# Patient Record
Sex: Female | Born: 1970 | Race: Black or African American | Hispanic: No | Smoking: Never smoker
Health system: Southern US, Community
[De-identification: ages and names within clinical notes are randomized; demographics above are authoritative.]

## PROBLEM LIST (undated history)

## (undated) DIAGNOSIS — N946 Dysmenorrhea, unspecified: Secondary | ICD-10-CM

## (undated) DIAGNOSIS — D259 Leiomyoma of uterus, unspecified: Secondary | ICD-10-CM

## (undated) DIAGNOSIS — M549 Dorsalgia, unspecified: Secondary | ICD-10-CM

## (undated) DIAGNOSIS — R7611 Nonspecific reaction to tuberculin skin test without active tuberculosis: Secondary | ICD-10-CM

## (undated) DIAGNOSIS — Z1231 Encounter for screening mammogram for malignant neoplasm of breast: Secondary | ICD-10-CM

## (undated) DIAGNOSIS — Z0000 Encounter for general adult medical examination without abnormal findings: Secondary | ICD-10-CM

## (undated) DIAGNOSIS — M797 Fibromyalgia: Secondary | ICD-10-CM

## (undated) DIAGNOSIS — G35 Multiple sclerosis: Secondary | ICD-10-CM

## (undated) DIAGNOSIS — F419 Anxiety disorder, unspecified: Secondary | ICD-10-CM

## (undated) DIAGNOSIS — J449 Chronic obstructive pulmonary disease, unspecified: Secondary | ICD-10-CM

## (undated) DIAGNOSIS — G43909 Migraine, unspecified, not intractable, without status migrainosus: Secondary | ICD-10-CM

## (undated) DIAGNOSIS — M5430 Sciatica, unspecified side: Secondary | ICD-10-CM

## (undated) DIAGNOSIS — E663 Overweight: Secondary | ICD-10-CM

## (undated) DIAGNOSIS — F988 Other specified behavioral and emotional disorders with onset usually occurring in childhood and adolescence: Secondary | ICD-10-CM

## (undated) DIAGNOSIS — M1990 Unspecified osteoarthritis, unspecified site: Secondary | ICD-10-CM

## (undated) DIAGNOSIS — I10 Essential (primary) hypertension: Secondary | ICD-10-CM

## (undated) DIAGNOSIS — F4310 Post-traumatic stress disorder, unspecified: Secondary | ICD-10-CM

## (undated) DIAGNOSIS — F319 Bipolar disorder, unspecified: Secondary | ICD-10-CM

## (undated) HISTORY — PX: TUBAL LIGATION: SHX77

## (undated) MED ORDER — TRIMETHOPRIM-SULFAMETHOXAZOLE 160 MG-800 MG TAB
160-800 mg | ORAL_TABLET | Freq: Two times a day (BID) | ORAL | Status: AC
Start: ? — End: 2012-11-16

## (undated) MED ORDER — OXYCODONE-ACETAMINOPHEN 5 MG-325 MG TAB
5-325 mg | ORAL_TABLET | ORAL | Status: DC | PRN
Start: ? — End: 2016-03-13

## (undated) MED ORDER — OXYCODONE-ACETAMINOPHEN 5 MG-325 MG TAB
5-325 mg | ORAL_TABLET | Freq: Four times a day (QID) | ORAL | Status: DC | PRN
Start: ? — End: 2016-03-13

## (undated) MED ORDER — CEPHALEXIN 500 MG CAP
500 mg | ORAL_CAPSULE | Freq: Four times a day (QID) | ORAL | Status: AC
Start: ? — End: 2012-11-13

---

## 2008-05-05 LAB — METABOLIC PANEL, COMPREHENSIVE
A-G Ratio: 1 — ABNORMAL LOW (ref 1.1–2.2)
ALT (SGPT): 40 U/L (ref 30–65)
AST (SGOT): 16 U/L (ref 15–37)
Albumin: 3.8 g/dL (ref 3.5–5.0)
Alk. phosphatase: 84 U/L (ref 50–136)
Anion gap: 10 mmol/L (ref 5–15)
BUN/Creatinine ratio: 12 (ref 12–20)
BUN: 13 MG/DL (ref 6–20)
Bilirubin, total: 0.3 MG/DL (ref <1.0)
CO2: 29 MMOL/L (ref 21–32)
Calcium: 9.3 MG/DL (ref 8.5–10.1)
Chloride: 106 MMOL/L (ref 97–108)
Creatinine: 1.1 MG/DL (ref 0.6–1.3)
GFR est AA: >60 mL/min/{1.73_m2} (ref >60)
GFR est non-AA: >60 mL/min/{1.73_m2} (ref >60)
Globulin: 3.8 g/dL (ref 2.0–4.0)
Glucose: 104 MG/DL — ABNORMAL HIGH (ref 50–100)
Potassium: 4.2 MMOL/L (ref 3.5–5.1)
Protein, total: 7.6 g/dL (ref 6.4–8.2)
Sodium: 145 MMOL/L (ref 136–145)

## 2008-05-05 LAB — CBC WITH AUTOMATED DIFF
ABS. BASOPHILS: 0 10*3/uL (ref 0.0–0.1)
ABS. EOSINOPHILS: 0.1 10*3/uL (ref 0.0–0.4)
ABS. LYMPHOCYTES: 3.3 10*3/uL (ref 0.8–3.5)
ABS. MONOCYTES: 0.6 10*3/uL (ref 0–1.0)
ABS. NEUTROPHILS: 4.1 10*3/uL (ref 1.8–8.0)
BASOPHILS: 0 % (ref 0–1)
EOSINOPHILS: 1 % (ref 0–7)
HCT: 35 % (ref 35.0–47.0)
HGB: 11.3 g/dL — ABNORMAL LOW (ref 11.5–16.0)
LYMPHOCYTES: 41 % (ref 12–49)
MCH: 28.6 PG (ref 26.0–34.0)
MCHC: 32.3 g/dL (ref 30.0–35.0)
MCV: 88.6 FL (ref 80.0–99.0)
MONOCYTES: 7 % (ref 5–13)
NEUTROPHILS: 51 % (ref 32–75)
PLATELET: 316 10*3/uL (ref 150–400)
RBC: 3.95 M/uL (ref 3.80–5.20)
RDW: 15.8 % — ABNORMAL HIGH (ref 11.5–14.5)
WBC: 8.1 10*3/uL (ref 3.6–11.0)

## 2008-05-05 LAB — TSH 3RD GENERATION: TSH: 1.41 u[IU]/mL (ref 0.35–5.5)

## 2008-07-26 LAB — URINALYSIS W/ REFLEX CULTURE
Bilirubin: NEGATIVE
Blood: NEGATIVE
Glucose: NEGATIVE MG/DL
Leukocyte Esterase: NEGATIVE
Nitrites: NEGATIVE
Protein: 100 MG/DL — AB
Specific gravity: 1.03 (ref 1.003–1.030)
Urobilinogen: 0.2 EU/DL (ref 0.2–1.0)
pH (UA): 6 (ref 5.0–8.0)

## 2008-07-26 LAB — CBC WITH AUTOMATED DIFF
ABS. BASOPHILS: 0 10*3/uL (ref 0.0–0.1)
ABS. EOSINOPHILS: 0.4 10*3/uL (ref 0.0–0.4)
ABS. LYMPHOCYTES: 3.3 10*3/uL (ref 0.8–3.5)
ABS. MONOCYTES: 0.5 10*3/uL (ref 0–1.0)
ABS. NEUTROPHILS: 3.8 10*3/uL (ref 1.8–8.0)
BASOPHILS: 0 % (ref 0–1)
EOSINOPHILS: 4 % (ref 0–7)
HCT: 38.6 % (ref 35.0–47.0)
HGB: 13 g/dL (ref 11.5–16.0)
LYMPHOCYTES: 42 % (ref 12–49)
MCH: 28.6 PG (ref 26.0–34.0)
MCHC: 33.7 g/dL (ref 30.0–35.0)
MCV: 85 FL (ref 80.0–99.0)
MONOCYTES: 6 % (ref 5–13)
NEUTROPHILS: 48 % (ref 32–75)
PLATELET: 312 10*3/uL (ref 150–400)
RBC: 4.54 M/uL (ref 3.80–5.20)
RDW: 14.6 % — ABNORMAL HIGH (ref 11.5–14.5)
WBC: 8 10*3/uL (ref 3.6–11.0)

## 2008-07-26 LAB — DRUG SCREEN UR - NO CONFIRM
AMPHETAMINES: NEGATIVE
BARBITURATES: NEGATIVE
BENZODIAZEPINES: NEGATIVE
COCAINE: POSITIVE — AB
OPIATES: POSITIVE — AB
PCP(PHENCYCLIDINE): NEGATIVE
THC (TH-CANNABINOL): POSITIVE — AB
TRICYCLICS: POSITIVE — AB

## 2008-07-26 LAB — METABOLIC PANEL, COMPREHENSIVE
A-G Ratio: 0.9 — ABNORMAL LOW (ref 1.1–2.2)
ALT (SGPT): 31 U/L (ref 30–65)
AST (SGOT): 15 U/L (ref 15–37)
Albumin: 4 g/dL (ref 3.5–5.0)
Alk. phosphatase: 71 U/L (ref 50–136)
Anion gap: 6 mmol/L (ref 5–15)
BUN/Creatinine ratio: 7 — ABNORMAL LOW (ref 12–20)
BUN: 8 MG/DL (ref 6–20)
Bilirubin, total: 0.6 MG/DL (ref <1.0)
CO2: 29 MMOL/L (ref 21–32)
Calcium: 9.4 MG/DL (ref 8.5–10.1)
Chloride: 103 MMOL/L (ref 97–108)
Creatinine: 1.1 MG/DL (ref 0.6–1.3)
GFR est AA: >60 mL/min/{1.73_m2} (ref >60)
GFR est non-AA: >60 mL/min/{1.73_m2} (ref >60)
Globulin: 4.3 g/dL — ABNORMAL HIGH (ref 2.0–4.0)
Glucose: 105 MG/DL — ABNORMAL HIGH (ref 50–100)
Potassium: 3.3 MMOL/L — ABNORMAL LOW (ref 3.5–5.1)
Protein, total: 8.3 g/dL — ABNORMAL HIGH (ref 6.4–8.2)
Sodium: 138 MMOL/L (ref 136–145)

## 2008-07-26 LAB — ETHYL ALCOHOL: ALCOHOL(ETHYL),SERUM: 4 MG/DL — AB

## 2008-07-27 NOTE — Consults (Signed)
Name: Anita Bell, Anita Bell Admitted: 07/26/2008  MR #: [id number] DOB: [birth year]  Account #: [id number] Age: 37  Consultant: Sol Blazing, MD Location: [phone]     CONSULTATION REPORT    DATE OF CONSULTATION: 07/27/2008  REFERRING PHYSICIAN:      REFERRING PHYSICIAN: Dr. Cathleen Corti.    PRIMARY CARE PHYSICIAN: Lillia Corporal, MD, also Charlies Constable, MD, and Dr.  Sallyanne Kuster (it is her pain management specialist).    REASON FOR CONSULTATION: Asthma.    HISTORY OF PRESENT ILLNESS: This is a 37 year old black female who  apparently became depressed and suicidal, came to [facility], was  evaluated, and admitted to the Psychiatric Unit.    ALLERGIES: ASPIRIN.    HOME MEDICATIONS:  1. Cymbalta 60 daily.  2. Lyrica 300 b.i.d.  3. Effexor XR 75 b.i.d.  4. Hydrocodone 7.5/500 two tablets b.i.d. p.r.n.  5. Wellbutrin 75 daily.  6. Flexeril 10 mg b.i.d.  7. Prilosec 20 mg daily.  8. Seroquel 200 at bedtime.  9. Flonase 2 sprays each nostril daily.  10. Singulair 10 mg daily.  11. Symbicort 2 puffs b.i.d.  12. Ventolin inhaler 2 puffs q.4h. p.r.n.  13. Lidoderm patch, 3 patches daily. They should be on for 12 and off for  12.    PAST MEDICAL HISTORY: Migraines. The last one was a month ago. Attention  deficit disorder. Schizoaffective disorder. Depression. Asthma. Spinal  degenerative disk disease. Fibromyalgia syndrome.    PAST SURGICAL HISTORY: Bilateral tubal ligation.    SOCIAL HISTORY: Does not smoke or use cigarettes. She uses cocaine (she  snorts it) and marijuana. She denies any use of IV drugs. She is  separated with 4 children, and she lives with her 2 sons.    FAMILY HISTORY: Mom has depression. Also, there is some diabetes on her  mother's side of the family. Dad has she quotes "just body aches."    REVIEW OF SYSTEMS: Other than some shortness of breath due to her asthma,  complete review of systems done and negative.    ANCILLARY DATA: CMP showed potassium 3.3. CBC without significant   findings. Urine toxicology positive for cocaine, marijuana, opiates, and  tricyclics. UA negative for infection.    PHYSICAL EXAMINATION:  VITAL SIGNS: 97.2, heart rate 80, respirations 16, BP 107/69.  GENERAL APPEARANCE: The patient is alert and oriented x3, in no acute  distress.  HEENT: Normocephalic, atraumatic. Pupils are equal and reactive to light.  Oropharynx is pink and moist.  NECK: No lymphadenopathy or thyromegaly.  HEART: Regular rate and rhythm. No murmurs, rubs, or gallops.  LUNGS: Clear to auscultation anteriorly and posteriorly. I hear no  wheezes or crackles.  ABDOMEN: Some tenderness in the left lower quadrant. No obvious masses.  Bowel sounds are present.  EXTREMITIES: Radial pulses 2+. No edema. Warm and dry.  NEUROLOGIC STATUS: No focal neurological deficits.    ASSESSMENT:  1. Hypokalemia. Replace.  2. Asthma. Continue home medications.  3. Constipation. Will put her on a bowel regimen.    DICTATED BY: Luanna Cole, NP      E-Signed By  Sol Blazing, MD 08/05/2008 13:23    Sol Blazing, MD    cc: Sol Blazing, MD        AB/WMX; D: 07/27/2008 1:47 P; T: 07/27/2008 3:37 P; DOC# [id number]; Job#  [id number]

## 2008-07-28 LAB — CULTURE, URINE
Colonies Counted: >100000
Colony Count: >100000

## 2008-07-28 LAB — CULTURE, MRSA
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
## 2008-11-29 ENCOUNTER — Encounter

## 2008-12-27 MED ORDER — DICLOFENAC 75 MG TAB, DELAYED RELEASE
75 mg | ORAL_TABLET | Freq: Two times a day (BID) | ORAL | Status: DC
Start: 2008-12-27 — End: 2009-07-10

## 2008-12-27 MED ORDER — POLYETHYLENE GLYCOL 3350 100 % ORAL POWDER
17 gram/dose | Freq: Every day | ORAL | Status: AC
Start: 2008-12-27 — End: 2008-12-30

## 2008-12-27 MED ORDER — CYCLOBENZAPRINE 10 MG TAB
10 mg | ORAL_TABLET | Freq: Three times a day (TID) | ORAL | Status: DC | PRN
Start: 2008-12-27 — End: 2009-02-18

## 2008-12-27 MED ORDER — ALBUTEROL SULFATE 0.083 % (0.83 MG/ML) SOLN FOR INHALATION
2.5 mg /3 mL (0.083 %) | RESPIRATORY_TRACT | Status: AC | PRN
Start: 2008-12-27 — End: 2009-01-26

## 2008-12-27 NOTE — Progress Notes (Signed)
HISTORY OF PRESENT ILLNESS  Anita Bell is a 38 y.o. female.  HPIcomes for follow up. Needs refills on medications.     Review of Systems   Constitutional: Negative for fever and chills.   Respiratory: Positive for shortness of breath. Negative for cough and sputum production.    Cardiovascular: Negative for chest pain.   Gastrointestinal: Negative.    Genitourinary: Negative.    Musculoskeletal: Positive for back pain.   Skin: Negative for rash and itching.   Neurological: Positive for weakness and headaches. Negative for dizziness, tingling, tremors and seizures.   Psychiatric/Behavioral: Negative for depression and suicidal ideas.   All other systems reviewed and are negative.        Physical Exam   Nursing note and vitals reviewed.  Constitutional: She is oriented. No distress.   HENT:   Head: Normocephalic and atraumatic.   Right Ear: External ear normal.   Left Ear: External ear normal.   Eyes: Conjunctivae and extraocular motions are normal. Right eye exhibits no discharge. Left eye exhibits no discharge.   Neck: Neck supple. No thyromegaly present.   Cardiovascular: Normal rate, regular rhythm and normal heart sounds.    Pulmonary/Chest: Effort normal and breath sounds normal. No respiratory distress. She has no wheezes. She has no rales. She exhibits no tenderness.   Abdominal: Soft. She exhibits no distension. No tenderness.   Musculoskeletal: She exhibits no edema and no tenderness.        Back R.O.M moderately limited in all planes   Lymphadenopathy:     She has no cervical adenopathy.   Neurological: She is alert and oriented. No cranial nerve deficit. Coordination normal.   Skin: No rash noted. She is not diaphoretic. No erythema.   Psychiatric: She has a normal mood and affect. Her behavior is normal. Judgment and thought content normal.       ASSESSMENT and PLAN  [name] was seen today for follow-up, asthma, constipation and medication refill.    Diagnoses and associated orders for this visit:     Asthma  - albuterol (PROVENTIL, VENTOLIN) nebulizer solution 0.083%; 3 mL by Nebulization route every four (4) hours as needed for Wheezing for 30 days.    Fibromyalgia  - diclofenac EC (VOLTAREN) tablet; Take 1 Tab by mouth two (2) times a day for 360 days.  - CYCLOBENZAPRINE 10 MG TAB; Take 1 Tab by mouth three (3) times daily as needed.    Chronic back pain  - diclofenac EC (VOLTAREN) tablet; Take 1 Tab by mouth two (2) times a day for 360 days.  - CYCLOBENZAPRINE 10 MG TAB; Take 1 Tab by mouth three (3) times daily as needed.    Constipation, chronic  - polyethylene glycol (MIRALAX) powder 255 g; Take 17 g by mouth daily for 3 days.    Other Orders  - DOCUSATE SODIUM 50 MG CAP; Take 50 mg by mouth two (2) times a day.

## 2008-12-27 NOTE — Progress Notes (Signed)
The patient presents with follow up with asthma flareups, achy body,constipation,  Medication refills. Pt need a pain mgt doctor.  Anita Bell  12/27/08

## 2009-02-18 MED ORDER — BUDESONIDE-FORMOTEROL HFA 160 MCG-4.5 MCG/ACTUATION AEROSOL INHALER
Freq: Two times a day (BID) | RESPIRATORY_TRACT | Status: DC
Start: 2009-02-18 — End: 2009-07-10

## 2009-02-18 MED ORDER — TRAMADOL 50 MG TAB
50 mg | ORAL_TABLET | Freq: Four times a day (QID) | ORAL | Status: AC | PRN
Start: 2009-02-18 — End: 2009-02-28

## 2009-02-18 MED ORDER — AMOXICILLIN-POT CLAVULANATE 400 MG-57 MG/5 ML ORAL SUSP
400-57 mg/5 mL | Freq: Two times a day (BID) | ORAL | Status: AC
Start: 2009-02-18 — End: 2009-02-28

## 2009-02-18 MED ORDER — ALBUTEROL SULFATE HFA 90 MCG/ACTUATION AEROSOL INHALER
90 mcg/actuation | RESPIRATORY_TRACT | Status: AC | PRN
Start: 2009-02-18 — End: 2010-02-13

## 2009-02-18 NOTE — Progress Notes (Signed)
HISTORY OF PRESENT ILLNESS  Anita Bell is a 38 y.o. female.  Dental Pain   The history is provided by the patient. This is a new problem. The current episode started yesterday. The problem occurs constantly. The problem has not changed since onset. The pain is located in the left lower mouth.The quality of the pain is aching.  The pain is at a severity of 10/10. Associated symptoms include headaches and gum redness.There was no vomiting, no nausea, no fever, no swelling, no chest pain, no shortness of breath and no drainage. Treatments tried: hydrocodone. The treatment provided mild relief. The patient has no cardiac history.      ROS    Physical Exam   Constitutional: She appears distressed.   HENT:   Head: Normocephalic and atraumatic.        Absent last moller.Gum swollen and tender   Eyes: Conjunctivae and extraocular motions are normal.   Neck: Neck supple.   Cardiovascular: Normal rate, regular rhythm and normal heart sounds.    Pulmonary/Chest: Effort normal and breath sounds normal. No respiratory distress. She has no wheezes. She has no rales.   Musculoskeletal: She exhibits no edema and no tenderness.   Lymphadenopathy:     She has no cervical adenopathy.   Skin: Skin is warm and dry. No rash noted. She is not diaphoretic. No erythema.   Psychiatric: She has a normal mood and affect. Her behavior is normal. Thought content normal.       ASSESSMENT and PLAN  Amada was seen today for follow up chronic condition and other.    Diagnoses and associated orders for this visit:  Saanya was seen today for follow up chronic condition and other.    Diagnoses and associated orders for this visit:    Dental abscess  - tramadol (ULTRAM)50 mg  tablet; Take 2 Tabs by mouth every six (6) hours as needed for Pain for 10 days.  Referred to [facility] dental clinic.  Extrinsic asthma, unspecified  - BUDESONIDE-FORMOTEROL HFA 160 MCG-4.5 MCG/ACTUATION AEROSOL INHALER; Take 2 Puffs by inhalation two (2) times a day.   - albuterol (PROVENTIL HFA,VENTOLIN HFA) 90 mcg/inhalation; Take 2 Puffs by inhalation every four (4) hours as needed for Wheezing for 360 days.    Unspecified myalgia and myositis  Stable on Present Regimen.Continue the same.  Adhd (attention deficit hyperactivity disorder)  Follows up with psychiatrist.      Dental abscess  - tramadol (ULTRAM)50 mg  tablet; Take 2 Tabs by mouth every six (6) hours as needed for Pain for 10 days.      Referred to [facility]  Dental clinic.

## 2009-02-18 NOTE — Progress Notes (Addendum)
Addended by: Ryun Velez on: 02/18/2009      Modules accepted: Orders

## 2009-02-18 NOTE — Progress Notes (Signed)
The patient presents with follow up, pt has tooth pain need antibiotic.  Anita Bell  02/18/09

## 2009-04-12 MED ORDER — SUMATRIPTAN 50 MG TAB
50 mg | ORAL_TABLET | Freq: Once | ORAL | Status: AC | PRN
Start: 2009-04-12 — End: 2009-05-12

## 2009-04-12 NOTE — Progress Notes (Signed)
Sees Dr. Elenora Fender.  Has had a H/A for a few days and now it is very painful.

## 2009-04-12 NOTE — Progress Notes (Signed)
HISTORY OF PRESENT ILLNESS  Anita Bell is a 38 y.o. female here with moderate migraine headache since this morning.She had similar headache yesterday but not that severe.Today headache 10 out of 10,associated with nausea and photophobia,no blurred vision,or focal weakness or neck tightness.tried Tylenol 650 mg did not touch her.asthma is under control now.  HPI    Review of Systems   Constitutional: Negative.  Negative for fever, chills and weight loss.   HENT: Negative for hearing loss, ear pain, congestion and tinnitus.    Eyes: Negative.    Respiratory: Positive for wheezing. Negative for sputum production and shortness of breath.    Cardiovascular: Negative.  Negative for chest pain, palpitations and orthopnea.   Gastrointestinal: Negative.    Genitourinary: Negative.    Musculoskeletal: Negative.    Skin: Negative.    Neurological: Positive for headaches. Negative for tingling, tremors, sensory change, speech change, focal weakness, seizures and loss of consciousness.   Endo/Heme/Allergies: Negative.    Psychiatric/Behavioral: Negative.        Physical Exam   Constitutional: She is oriented to person, place, and time. She appears well-developed and well-nourished. She appears distressed.   HENT:   Head: Normocephalic and atraumatic.   Right Ear: External ear normal.   Left Ear: External ear normal.   Nose: Nose normal.   Mouth/Throat: Oropharynx is clear and moist. No oropharyngeal exudate.   Eyes: Conjunctivae and extraocular motions are normal. Pupils are equal, round, and reactive to light. Left eye exhibits no discharge.   Neck: Normal range of motion. Neck supple. No JVD present. No thyromegaly present.   Cardiovascular: Normal rate, regular rhythm, normal heart sounds and intact distal pulses.    Pulmonary/Chest: Effort normal and breath sounds normal.   Lymphadenopathy:     She has no cervical adenopathy.    Neurological: She is alert and oriented to person, place, and time. She has normal reflexes. She displays normal reflexes. No cranial nerve deficit. She exhibits normal muscle tone. Coordination normal.   Skin: Skin is warm. No rash noted. No erythema. No pallor.   Psychiatric: She has a normal mood and affect. Her behavior is normal. Judgment and thought content normal.       ASSESSMENT and PLAN  Anita Bell was seen today for headache.    Diagnoses and associated orders for this visit:    Migraine nec/intractable  - sumatriptan (IMITREX) 50 mg  tablet; Take 1 Tab by mouth once as needed for Migraine for 30 days.  - KETOROLAC TROMETHAMINE INJ 60 mg IM given once.  - THER/PROPH/DIAG INJECTION, SUBCUT/IM  - PROMETHAZINE HCL INJECTION 25 mg IM once given.  - THER/PROPH/DIAG INJECTION, SUBCUT/IM  Adv to go to ER with worsen headache.    Extrinsic asthma, unspecified  Controlled with symbicort and singulair.

## 2009-04-12 NOTE — Patient Instructions (Signed)
English   Spanish  Migraine Headache: After Your Visit  Your Care Instructions  Migraines are painful, throbbing headaches that often start on one side of the head. They may cause nausea and vomiting and make you sensitive to light, sound, or smell.  Without treatment, migraines can last from 4 hours to a few days. Medicines can help prevent migraines or stop them once they have started. Your doctor can help you find which ones work best for you.  Follow-up care is a key part of your treatment and safety. Be sure to make and go to all appointments, and call your doctor if you are having problems. It???s also a good idea to know your test results and keep a list of the medicines you take.  How can you care for yourself at home?  ?? Do not drive if you have taken a prescription pain medicine.   ?? Rest in a quiet, dark room until your headache is gone. Close your eyes and try to relax or go to sleep. Do not watch TV or read.   ?? Put a cold, moist cloth or cold pack on the painful area for 10 to 20 minutes at a time. Put a thin cloth between the cold pack and your skin.   ?? Have someone gently massage your neck and shoulders.   ?? Take your medicines exactly as prescribed. Call your doctor if you think you are having a problem with your medicine. You will get more details on the specific medicines your doctor prescribes.  To prevent migraines   ?? Keep a headache diary so you can figure out what triggers your headaches. Avoiding triggers may help you prevent headaches. Record when each headache began, how long it lasted, and what the pain was like (throbbing, aching, stabbing, or dull). Write down any other symptoms you had with the headache, such as nausea, flashing lights or dark spots, or sensitivity to bright light or loud noise. Note if the headache occurred near your period. List anything that might have triggered the headache, such as certain foods (chocolate, cheese, wine) or odors, smoke, bright light, stress, or lack of sleep.   ?? If your doctor has prescribed medicine for your migraines, take it as directed. You may have medicine that you take only when you get a migraine and medicine that you take all the time to help prevent migraines.   ?? If your doctor has prescribed medicine for when you get a headache, take it at the first sign of a migraine, unless your doctor has given you other instructions.   ?? If your doctor has prescribed medicine to prevent migraines, take it exactly as prescribed. Call your doctor if you think you are having a problem with your medicine.  ?? Find healthy ways to deal with stress. Migraines are most common during or right after stressful times. Take time to relax before and after you do something that has caused a migraine in the past.   ?? Try to keep your muscles relaxed by keeping good posture. Check your jaw, face, neck, and shoulder muscles for tension, and try relaxing them. When sitting at a desk, change positions often, and stretch for 30 seconds each hour.   ?? Get regular sleep and exercise.   ?? Eat regular meals, and avoid foods and drinks that often trigger migraines. These include chocolate, alcohol (especially red wine and port), aspartame, monosodium glutamate (MSG), and some additives found in foods (such as hot dogs, bacon, cold   cuts, aged cheeses, and pickled foods).    ?? Limit caffeine by not drinking too much coffee, tea, or soda. But do not quit caffeine suddenly, because that can also give you migraines.   ?? Do not smoke or allow others to smoke around you. If you need help quitting, talk to your doctor about stop-smoking programs and medicines. These can increase your chances of quitting for good.   ?? If you are taking birth control pills or hormone therapy, talk to your doctor about whether they are triggering your migraines.  When should you call for help?  Call 911 anytime you think you may need emergency care. For example, call if:  ?? You have signs of a stroke. These may include:   ?? Sudden numbness, paralysis, or weakness in your face, arm, or leg, especially on only one side of your body.   ?? New problems with walking or balance.   ?? Sudden vision changes.   ?? Drooling or slurred speech.   ?? New problems speaking or understanding simple statements, or feeling confused.   ?? A sudden, severe headache that is different from past headaches.  Call your doctor now or seek immediate medical care if:  ?? You develop a fever and a stiff neck.   ?? You have new nausea and vomiting, or you cannot keep down food or liquids.  Watch closely for changes in your health, and be sure to contact your doctor if:  ?? Your headache has not gotten better within 1 or 2 days.   ?? Your headaches get worse or happen more often.   Where can you learn more?   Go to http://www.healthwise.net/[facility].  Enter U690 in the search box to learn more about "Migraine Headache: After Your Visit."    ?? 2006-2010 Healthwise, [employer]. Care instructions adapted under license by [employer] (which disclaims liability or warranty for this information). This care instruction is for use with your licensed healthcare professional. If you have questions about a medical condition or this instruction, always ask your healthcare professional. Healthwise disclaims any warranty or liability for your use of this information.

## 2009-04-30 MED ORDER — PREGABALIN 300 MG CAP
300 mg | ORAL_CAPSULE | ORAL | Status: DC
Start: 2009-04-30 — End: 2009-09-14

## 2009-05-06 MED ORDER — SUMATRIPTAN 6 MG/0.5 ML SUB-Q
6 mg/0.5 mL | SUBCUTANEOUS | Status: AC
Start: 2009-05-06 — End: 2009-05-06
  Administered 2009-05-06: 09:00:00 via SUBCUTANEOUS

## 2009-05-06 MED ORDER — HYDROXYZINE HCL 50 MG/ML IM SOLN
50 mg/mL | INTRAMUSCULAR | Status: AC
Start: 2009-05-06 — End: 2009-05-06
  Administered 2009-05-06: 10:00:00 via INTRAMUSCULAR

## 2009-05-06 MED FILL — HYDROXYZINE HCL 50 MG/ML IM SOLN: 50 mg/mL | INTRAMUSCULAR | Qty: 2

## 2009-05-06 MED FILL — IMITREX 6 MG/0.5 ML SUBCUTANEOUS SOLUTION: 6 mg/0.5 mL | SUBCUTANEOUS | Qty: 0.5

## 2009-05-06 NOTE — ED Provider Notes (Signed)
Patient is a 38 y.o. female presenting with headache. The history is provided by the patient. No language interpreter was used.   Headache   This is a recurrent problem. The current episode started 3 to 5 hours ago. The problem occurs constantly. The problem has not changed since onset. The headache is aggravated by bright light and photophobia. The pain is located in the left unilateral region. The quality of the pain is described as throbbing. The pain is at a severity of 10/10. Associated symptoms include nausea. Pertinent negatives include no anorexia, no fever, no malaise/fatigue, no chest pressure, no near-syncope, no orthopnea, no palpitations, no syncope, no shortness of breath, no weakness, no tingling, no dizziness, no visual change and no vomiting. She has tried nothing for the symptoms.        Past Medical History   Diagnosis Date   ??? ADHD    ??? Depression    ??? Asthma    ??? Osteoarthritis    ??? Fibromyalgia    ??? Gastroenteritis    ??? Hydradenitis      suppurativa          No past surgical history on file.      Family History   Problem Relation   ??? Alcohol abuse Neg Hx   ??? Arthritis-rheumatoid Neg Hx   ??? Asthma Neg Hx   ??? Bleeding Prob Neg Hx   ??? Cancer Neg Hx   ??? Diabetes Neg Hx   ??? Elevated Lipids Neg Hx   ??? Headache Neg Hx   ??? Heart Disease Neg Hx   ??? Hypertension Neg Hx   ??? Lung Disease Neg Hx   ??? Migraines Neg Hx   ??? Psychiatric Disorder Neg Hx   ??? Stroke Neg Hx   ??? Mental Retardation Neg Hx          History   Social History   ??? Marital Status: Unknown     Spouse Name: N/A     Number of Children: N/A   ??? Years of Education: N/A   Occupational History   ??? Not on file.   Social History Main Topics   ??? Tobacco Use: Never   ??? Alcohol Use: No   ??? Drug Use: Yes     Special: Prescription, Opiates   ??? Sexually Active: No   Other Topics Concern   ??? Not on file   Social History Narrative   ??? No narrative on file           ALLERGIES: Aspirin and Ibuprofen      Review of Systems    Constitutional: Negative for fever and malaise/fatigue.   Respiratory: Negative for shortness of breath.    Cardiovascular: Negative for palpitations, orthopnea and syncope.   Gastrointestinal: Positive for nausea. Negative for vomiting.   Neurological: Negative for dizziness, tingling and weakness.   All other systems reviewed and are negative.        Filed Vitals:    05/06/2009  4:01 AM   BP: 139/97   Pulse: 87   Temp: 98.2 ??F (36.8 ??C)   Resp: 16   Height: 5\' 5"  (1.651 m)   Weight: 170 lb (77.111 kg)   SpO2: 100%              Physical Exam   Nursing note and vitals reviewed.  Constitutional: She is oriented to person, place, and time. She appears well-developed and well-nourished. No distress.   HENT:   Head: Normocephalic and atraumatic.  Right Ear: External ear normal.   Left Ear: External ear normal.   Nose: Nose normal.   Mouth/Throat: Oropharynx is clear and moist. No oropharyngeal exudate.   Eyes: Extraocular motions are normal. Pupils are equal, round, and reactive to light. Right eye exhibits no discharge. Left eye exhibits no discharge. Right conjunctiva is injected. Left conjunctiva is injected. No scleral icterus.   Neck: Normal range of motion. Neck supple. No JVD present. No tracheal deviation present. No thyromegaly present.   Cardiovascular: Normal rate, regular rhythm and normal heart sounds.  Exam reveals no gallop and no friction rub.    No murmur heard.  Pulmonary/Chest: Effort normal and breath sounds normal. No stridor. No respiratory distress. She has no wheezes. She has no rales. She exhibits no tenderness.   Lymphadenopathy:     She has no cervical adenopathy.   Neurological: She is alert and oriented to person, place, and time. No cranial nerve deficit.   Skin: Skin is warm and dry. No rash noted. She is not diaphoretic. No erythema.   Psychiatric: She has a normal mood and affect. Her behavior is normal. Judgment and thought content normal.            Coding      Procedures

## 2009-05-06 NOTE — ED Notes (Signed)
[employer] called to arrange cab transportation  [phone] reference number

## 2009-05-06 NOTE — ED Notes (Signed)
Patient complaint of headache since 11pm last night. Patient was at [facility] but stated "we were tired of waiting".

## 2009-05-06 NOTE — Progress Notes (Signed)
5:17 AM No relief with Imitrex. Agrees to find a ride home so we can give her a shot of Vistaril on discharge.

## 2009-07-10 MED ORDER — METHYLPREDNISOLONE 4 MG TABS IN A DOSE PACK
4 mg | PACK | ORAL | Status: DC
Start: 2009-07-10 — End: 2009-09-14

## 2009-07-10 MED ORDER — CYCLOBENZAPRINE 10 MG TAB
10 mg | ORAL_TABLET | Freq: Three times a day (TID) | ORAL | Status: AC
Start: 2009-07-10 — End: 2009-07-20

## 2009-07-10 MED ORDER — HYDROCODONE-ACETAMINOPHEN 5 MG-500 MG TAB
5-500 mg | ORAL | Status: AC
Start: 2009-07-10 — End: 2009-07-10
  Administered 2009-07-10: 19:00:00 via ORAL

## 2009-07-10 MED ORDER — HYDROCODONE-ACETAMINOPHEN 7.5 MG-500 MG TAB
ORAL_TABLET | Freq: Four times a day (QID) | ORAL | Status: DC | PRN
Start: 2009-07-10 — End: 2009-09-14

## 2009-07-10 MED FILL — HYDROCODONE-ACETAMINOPHEN 5 MG-500 MG TAB: 5-500 mg | ORAL | Qty: 2

## 2009-07-10 NOTE — ED Notes (Signed)
Stable at discharge, verbalizes understanding of instructions, walked out with friends, 3 prescriptions given.

## 2009-07-10 NOTE — ED Notes (Signed)
Patient with multiple medical problems, states back pain worsening last night after falling off the bed, states she landed on her back.

## 2009-07-10 NOTE — ED Provider Notes (Signed)
Patient is a 38 y.o. female presenting with back pain. The history is provided by the patient Larey Seat out of bed last night landing on her back and is in severe pain.  Denies numbness, tingling, weakness, pain into legs.). No language interpreter was used.   Back Pain   This is a new problem. The current episode started 6 to 12 hours ago. The problem has not changed since onset. The problem occurs constantly. Patient reports no work related injury.The pain is associated with a fall. The pain is present in the thoracic spine and lumbar spine. The quality of the pain is described as stabbing and aching. The pain does not radiate. The pain is at a severity of 9/10. The pain is severe. The symptoms are aggravated by bending, twisting and certain positions. The pain is the same all the time. Pertinent negatives include no chest pain, no fever, no numbness, no weight loss, no headaches, no abdominal pain, no abdominal swelling, no bowel incontinence, no perianal numbness, no bladder incontinence, no dysuria, no pelvic pain, no leg pain, no paresthesias, no paresis, no tingling and no weakness. She has tried nothing for the symptoms. Risk factors: none.        Past Medical History   Diagnosis Date   ??? ADHD    ??? Depression    ??? Asthma    ??? Osteoarthritis    ??? Fibromyalgia    ??? Gastroenteritis    ??? Hydradenitis      suppurativa          No past surgical history on file.      Family History   Problem Relation Age of Onset   ??? Alcohol abuse Neg Hx    ??? Arthritis-rheumatoid Neg Hx    ??? Asthma Neg Hx    ??? Bleeding Prob Neg Hx    ??? Cancer Neg Hx    ??? Diabetes Neg Hx    ??? Elevated Lipids Neg Hx    ??? Headache Neg Hx    ??? Heart Disease Neg Hx    ??? Hypertension Neg Hx    ??? Lung Disease Neg Hx    ??? Migraines Neg Hx    ??? Psychiatric Disorder Neg Hx    ??? Stroke Neg Hx    ??? Mental Retardation Neg Hx           History   Social History   ??? Marital Status: Unknown     Spouse Name: N/A     Number of Children: N/A   ??? Years of Education: N/A    Occupational History   ??? Not on file.   Social History Main Topics   ??? Tobacco Use: Never   ??? Alcohol Use: No   ??? Drug Use: Yes     Special: Prescription, Opiates   ??? Sexually Active: No   Other Topics Concern   ??? Not on file   Social History Narrative   ??? No narrative on file           ALLERGIES: Aspirin and Ibuprofen      Review of Systems   Constitutional: Negative for fever and weight loss.   Cardiovascular: Negative for chest pain.   Gastrointestinal: Negative for abdominal pain.   Genitourinary: Negative for bladder incontinence, dysuria and pelvic pain.   Musculoskeletal: Positive for back pain.   Neurological: Negative for tingling, weakness, numbness and headaches.   All other systems reviewed and are negative.        Filed  Vitals:    07/10/2009  1:01 PM 07/10/2009  1:03 PM   BP:  106/75   Pulse: 78    Temp: 98.4 ??F (36.9 ??C)    Resp: 18    Height: 5\' 5"  (1.651 m)    Weight: 176 lb (79.833 kg)    SpO2: 100%               Physical Exam   Nursing note and vitals reviewed.  Constitutional: She is oriented to person, place, and time. She appears well-developed and well-nourished. No distress.   HENT:   Head: Normocephalic and atraumatic.   Right Ear: External ear normal.   Left Ear: External ear normal.   Mouth/Throat: Oropharynx is clear and moist. No oropharyngeal exudate.   Eyes: Conjunctivae and extraocular motions are normal. Pupils are equal, round, and reactive to light. Right eye exhibits no discharge. Left eye exhibits no discharge. No scleral icterus.   Neck: Normal range of motion. Neck supple. No JVD present. No tracheal deviation present. No thyromegaly present.   Cardiovascular: Normal rate, regular rhythm and normal heart sounds.    No murmur heard.  Pulmonary/Chest: Effort normal and breath sounds normal. No respiratory distress. She has no wheezes. She has no rales. She exhibits no tenderness.    Abdominal: Soft. Bowel sounds are normal. She exhibits no distension and no mass. No tenderness. She has no rebound and no guarding.   Musculoskeletal: Normal range of motion. She exhibits tenderness. She exhibits no edema.        Mid back;  Tenderness to palpation with spasm present left lower para spinalis muscles.  Low back;  Tenderness to palpation with spasm present bilateral para spinalis muscles L>R.  Pos. L/S joint tenderness   Pos. Bilateral S-I joint tenderness   Neg. Sciatic notch tenderness.   Lymphadenopathy:     She has no cervical adenopathy.   Neurological: She is alert and oriented to person, place, and time. She has normal reflexes. She displays normal reflexes. No cranial nerve deficit. She exhibits normal muscle tone. Coordination normal.   Skin: Skin is warm. No rash noted. She is not diaphoretic. No erythema. No pallor.   Psychiatric: She has a normal mood and affect. Her behavior is normal. Judgment and thought content normal.        Coding    Procedures

## 2009-09-14 NOTE — ED Provider Notes (Signed)
HPI Comments: Pt no longer has a family doctor, but still has insurance. The doctor refused her from his practice. Patient has run out of all of her medications especially her pain medications.    Patient is a 38 y.o. female presenting with abdominal pain and back pain. The history is provided by the patient.   Abdominal Pain   This is a new problem. The current episode started more than 2 days ago. The problem occurs constantly. The problem has been gradually worsening. The pain is located in the suprapubic region. The quality of the pain is aching. The pain is at a severity of 5/10. The pain is mild. Associated symptoms include back pain. Pertinent negatives include no diarrhea, no nausea, no dysuria and no chest pain. Nothing worsens the pain. The pain is relieved by nothing. Past workup includes no CT scan and no surgery. Her past medical history does not include PUD.   Back Pain   This is a chronic problem. The current episode started more than 1 week ago. The problem has not changed since onset. The problem occurs constantly. The pain is associated with no known injury. The pain is present in the lumbar spine. The quality of the pain is described as stabbing. The pain does not radiate. The pain is at a severity of 5/10. The pain is mild. The symptoms are aggravated by bending. The pain is the same all the time. Associated symptoms include abdominal pain. Pertinent negatives include no chest pain, no bowel incontinence, no perianal numbness, no bladder incontinence and no dysuria.        Past Medical History   Diagnosis Date   ??? ADHD    ??? Depression    ??? Asthma    ??? Osteoarthritis    ??? Fibromyalgia    ??? Gastroenteritis    ??? Hydradenitis      suppurativa          No past surgical history on file.      Family History   Problem Relation Age of Onset   ??? Alcohol abuse Neg Hx    ??? Arthritis-rheumatoid Neg Hx    ??? Asthma Neg Hx    ??? Bleeding Prob Neg Hx    ??? Cancer Neg Hx    ??? Diabetes Neg Hx     ??? Elevated Lipids Neg Hx    ??? Headache Neg Hx    ??? Heart Disease Neg Hx    ??? Hypertension Neg Hx    ??? Lung Disease Neg Hx    ??? Migraines Neg Hx    ??? Psychiatric Disorder Neg Hx    ??? Stroke Neg Hx    ??? Mental Retardation Neg Hx           History   Social History   ??? Marital Status: Unknown     Spouse Name: N/A     Number of Children: N/A   ??? Years of Education: N/A   Occupational History   ??? Not on file.   Social History Main Topics   ??? Tobacco Use: Never   ??? Alcohol Use: No   ??? Drug Use: Yes     Special: Prescription, Opiates   ??? Sexually Active: No   Other Topics Concern   ??? Not on file   Social History Narrative   ??? No narrative on file           ALLERGIES: Aspirin and Ibuprofen      Review of Systems  Constitutional: Negative.    HENT: Negative.    Eyes: Negative.    Respiratory: Negative.    Cardiovascular: Negative for chest pain.   Gastrointestinal: Positive for abdominal pain. Negative for nausea and diarrhea.   Genitourinary: Negative for bladder incontinence and dysuria.   Musculoskeletal: Positive for back pain.   All other systems reviewed and are negative.        Filed Vitals:    09/14/2009  8:27 PM   BP: 119/74   Pulse: 87   Temp: 98.1 ??F (36.7 ??C)   Resp: 16   Height: 5\' 5"  (1.651 m)   Weight: 182 lb (82.555 kg)   SpO2: 100%              Physical Exam   Nursing note and vitals reviewed.  Constitutional: She is oriented to person, place, and time. She appears well-developed and well-nourished.   HENT:   Head: Normocephalic and atraumatic.   Right Ear: External ear normal.   Left Ear: External ear normal.   Mouth/Throat: Oropharynx is clear and moist. No oropharyngeal exudate.   Eyes: Conjunctivae and extraocular motions are normal. Pupils are equal, round, and reactive to light. Right eye exhibits no discharge. Left eye exhibits no discharge. No scleral icterus.   Neck: Normal range of motion. No tracheal deviation present. No thyromegaly present.    Cardiovascular: Normal rate, regular rhythm and normal heart sounds.    No murmur heard.  Pulmonary/Chest: Effort normal and breath sounds normal. No respiratory distress. She has no wheezes. She has no rales. She exhibits no tenderness.   Abdominal: Soft. She exhibits no distension. No tenderness. She has no rebound and no guarding.   Musculoskeletal: Normal range of motion. She exhibits no edema and no tenderness.   Lymphadenopathy:     She has no cervical adenopathy.   Neurological: She is alert and oriented to person, place, and time. No cranial nerve deficit. Coordination normal.   Skin: Skin is warm. No erythema.   Psychiatric: She has a normal mood and affect. Her behavior is normal. Judgment and thought content normal.        Coding    Procedures

## 2009-09-14 NOTE — ED Notes (Signed)
Patient complains of suprapubic pain and lower back pain x 3 days. Patient denies any vaginal discharge or urinary symptoms. Patient complains of some nausea but no vomiting.

## 2009-09-15 LAB — URINALYSIS W/ REFLEX CULTURE
Bilirubin: NEGATIVE
Glucose: NEGATIVE MG/DL
Ketone: NEGATIVE MG/DL
Leukocyte Esterase: NEGATIVE
Nitrites: NEGATIVE
Specific gravity: 1.029 (ref 1.003–1.030)
Urobilinogen: 0.2 EU/DL (ref 0.2–1.0)
pH (UA): 6 (ref 5.0–8.0)

## 2009-09-15 LAB — HCG URINE, QL. - POC: Pregnancy test,urine (POC): NEGATIVE

## 2009-09-15 MED ORDER — PROMETHAZINE 25 MG TAB
25 mg | ORAL_TABLET | Freq: Four times a day (QID) | ORAL | Status: AC | PRN
Start: 2009-09-15 — End: 2009-09-21

## 2009-09-15 MED ORDER — PREGABALIN 50 MG CAP
50 mg | ORAL_CAPSULE | Freq: Three times a day (TID) | ORAL | Status: AC
Start: 2009-09-15 — End: 2009-10-14

## 2009-09-15 MED ORDER — HYDROCODONE-ACETAMINOPHEN 7.5 MG-500 MG TAB
ORAL_TABLET | Freq: Four times a day (QID) | ORAL | Status: DC | PRN
Start: 2009-09-15 — End: 2012-11-05

## 2009-09-15 MED ADMIN — ondansetron (ZOFRAN ODT) tablet 4 mg: ORAL | @ 02:00:00 | NDC 62756024064

## 2009-09-15 MED ADMIN — acetaminophen (TYLENOL) tablet 1,000 mg: ORAL | @ 02:00:00 | NDC 51645070610

## 2009-09-15 MED FILL — ONDANSETRON 4 MG TAB, RAPID DISSOLVE: 4 mg | ORAL | Qty: 1

## 2009-09-15 MED FILL — ACETAMINOPHEN 500 MG TAB: 500 mg | ORAL | Qty: 2

## 2009-09-16 LAB — CULTURE, URINE
Colonies Counted: 11000
Colony Count: 11000

## 2009-10-18 MED ORDER — CYCLOBENZAPRINE 10 MG TAB
10 mg | ORAL_TABLET | Freq: Three times a day (TID) | ORAL | Status: AC | PRN
Start: 2009-10-18 — End: 2009-10-28

## 2009-10-18 MED ORDER — TRAMADOL 50 MG TAB
50 mg | ORAL_TABLET | Freq: Four times a day (QID) | ORAL | Status: AC | PRN
Start: 2009-10-18 — End: 2009-10-28

## 2009-10-18 NOTE — ED Provider Notes (Signed)
I have personally seen and evaluated patient. I find the patient's history and physical exam are consistent with the PA's NP documentation. I agree with the care provided, treatments rendered, disposition and follow up plan.

## 2009-10-18 NOTE — ED Notes (Signed)
Patient states "chronic back pain that started in 2004 when fell in a sink hole fell on back, went to the doctor today and they couldn't see me because my insurance company was closed, and I'm out of medicine, my doctor is Tommi Rumps on the 3rd floor". The patient is out of Loratab and Lyrica.

## 2009-10-18 NOTE — ED Notes (Signed)
Patient given DC instructions and 2 scripts.  Patient verbalized understanding of instructions and scripts.  Patient alert and oriented and in no acute distress.  Patient ambulated out of ED with self.  Patient given the opportunity to ask questions prior to discharge.

## 2009-10-18 NOTE — ED Provider Notes (Addendum)
HPI Comments: Patient was seen by PCP upstairs earlier today and apparently could not be seen for medication refill d/t "insurance issues."   Relates a ~5 year hx of lower back pain which has recently worsened x 1.5 weeks since she ran out of her Lyrica, Lortab and Lidoderm patches. She denies any recent trauma. Also denies any paresthesias, bowel or bladder incontinence.     Patient is a 38 y.o. female presenting with medication refill. The history is provided by the patient.   Medication Refill  This is a new problem. The current episode started more than 1 week ago. Pertinent negatives include no chest pain and no shortness of breath.        Past Medical History   Diagnosis Date   ??? Other ill-defined conditions      chronic back pain   ??? Psychiatric disorder    ??? Asthma           Past Surgical History   Procedure Date   ??? Hx gyn      tubes tided           No family history on file.     History   Social History   ??? Marital Status: N/A     Spouse Name: N/A     Number of Children: N/A   ??? Years of Education: N/A   Occupational History   ??? Not on file.   Social History Main Topics   ??? Smoking status: Never Smoker    ??? Smokeless tobacco: Not on file   ??? Alcohol Use: No   ??? Drug Use: No   ??? Sexually Active:    Other Topics Concern   ??? Not on file   Social History Narrative   ??? No narrative on file           ALLERGIES: Aspirin and Ibuprofen      Review of Systems   Constitutional: Negative.  Negative for fever and chills.   HENT: Negative.    Eyes: Negative.    Respiratory: Negative.  Negative for cough and shortness of breath.    Cardiovascular: Negative.  Negative for chest pain.   Gastrointestinal: Negative.  Negative for nausea, vomiting and diarrhea.   Genitourinary: Negative.    Musculoskeletal: Negative.    Skin: Negative.    Neurological: Negative.    Hematological: Negative.    Psychiatric/Behavioral: Negative.        Filed Vitals:    10/18/2009 10:41 AM   BP: 137/82   Pulse: 82   Temp: 98.8 ??F (37.1 ??C)    Resp: 18   Height: 5\' 5"  (1.651 m)   Weight: 165 lb (74.844 kg)   SpO2: 99%              Physical Exam   Nursing note and vitals reviewed.  Constitutional: She is oriented to person, place, and time. She appears well-developed and well-nourished.   HENT:   Head: Normocephalic and atraumatic.   Right Ear: External ear normal.   Left Ear: External ear normal.   Mouth/Throat: Oropharynx is clear and moist. No oropharyngeal exudate.   Eyes: Conjunctivae and extraocular motions are normal. Pupils are equal, round, and reactive to light. Right eye exhibits no discharge. Left eye exhibits no discharge. No scleral icterus.   Neck: Normal range of motion. No tracheal deviation present. No thyromegaly present.   Cardiovascular: Normal rate, regular rhythm and normal heart sounds.    No murmur heard.  Pulmonary/Chest: Effort normal  and breath sounds normal. No respiratory distress. She has no wheezes. She has no rales. She exhibits no tenderness.   Abdominal: Soft. She exhibits no distension. No tenderness. She has no rebound and no guarding.   Musculoskeletal: Normal range of motion. She exhibits tenderness.        Paravertebral T and L spine tenderness to palpation. Sensory intact     Lymphadenopathy:     She has no cervical adenopathy.   Neurological: She is alert and oriented to person, place, and time. No cranial nerve deficit. Coordination normal.   Skin: Skin is warm. No erythema.   Psychiatric: She has a normal mood and affect. Her behavior is normal. Judgment and thought content normal.        Coding    Procedures

## 2009-10-27 ENCOUNTER — Encounter

## 2012-06-11 ENCOUNTER — Encounter

## 2012-06-20 NOTE — Progress Notes (Signed)
[facility]  [address]  [place], [place]  [address]    OUTPATIENT physical Therapy  [x]       Initial Evaluation []      30 day/10th visit progress note []      90 day/re-certification    NAME: Anita Bell AGE: 41 y.o.  GENDER: female  DATE: 06/20/2012  REFERRING PHYSICIAN: Rubye Oaks  HISTORY AND BACKGROUND:   Medical Diagnosis: lumbago  Date of Onset: '04 Fell into sink hole onto back; BP since then   Reason for Referral:chronic back pain   Present Symptoms: Identifies central back pain 'all the way down; more to the R.' Pain described as numbing, stabbing, constant, wringing, cramping.  'Traveling' pain; goes from one location to another. Numbness legs & arms; no pattern identified    Functional Deficits and Limitations: pain with 'everything'   [x]      Sitting:   [x]     Dressing:   [x]     Reaching:  [x]      Standing:   [x]      Bathing:   [x]     Lifting:  [x]      Walking:   []      Cooking: na  []     Yardwork:na  [x]      Sleeping:   []      Cleaning: na   [x]      Driving: manual  []      Work Tasks: na  [x]      Recreation:  []     Other:  Past Medical History:   Past Medical History   Diagnosis Date   ??? ADHD    ??? Depression    ??? Osteoarthritis    ??? Fibromyalgia    ??? Gastroenteritis    ??? Hydradenitis      suppurativa   ??? Other ill-defined conditions      chronic back pain   ??? Psychiatric disorder    ??? Asthma      Past Surgical History   Procedure Date   ??? Hx gyn      tubes tided     Precautions: none  Current Medications:  Mm relaxants + meds listed in CC  Social/Work History and Prior Level of Function:  Frequent moves recently.  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  Lives with family.  Stairs.  Previous Therapy:  Yes, not here.  Also for back. Improved following.      SUBJECTIVE:   Three rounds of therapy in past. Helpful; did a lot of exercise. Reports she loved to exercise pre injury.    Patient???s goals for therapy: ???I wanna be abe to go back to work.  Have a  better life.???    OBJECTIVE DATA SUMMARY:   Pain:   Back now 9.  Ranges from 7-10.    fibromyalgia pain as for back    General Observation/Posture:   Using cane in R arm. Moves very slowly with transitions, yet is constantly moving/changing position in chair.  Stands with L knee in  recurvatum & R flexed, off loading R.  States she always sleeps on her back    Strength:   Unable to SLR > 1 inch from mat bilat with poor pelvic stability.  More difficulty with R   Unable to clear buttocks in bridge attempt  Unable to lefit upper body from supine   Hip strength: 4/5 hip flexion with pain back.  Abd & ext tested sidelying with strength 2/5 + pain  Distal  extremity strength appears WNL    Range of Motion:   Gross trunk   Flexes to 34 cm from fingtertips to floor.  Min ext   48 R SB & 52 L   ~1/3 normal rotation in sitting  65 hamstrings bilat with pain 'entire R side' with passive lifting L leg  Hyperext L knee vs R    Spinal Assessment:   Increased lumbar lordosis + hypermobility with PA pressures  In standing, difficult to shift wt from L to R to equalize wt bearing    Joint Mobility:   hypermob lumbar. Pain with PA pressures throughout    Palpation:   ttp to light touch, all locations, including with MMT distal extremities  Palpation back otherwise unremarkable    Neurologic Assessment:   Tone: lower than nl   Sensation: intact to light touch   Reflexes: nl    Special Tests:   Neg neural tension - slump & SLR     Outcome Measures:   Roland Morris 24/24    Five times sit to stand:    Five Times Sit to Stand: 22.3 Seconds         Normative averages:  Clients younger than 40 years old  ??  10 seconds = Normal   Clients older than 40 years old ?? 14.2 seconds = Normal   Change of ?? 2.3 seconds shows a significant clinical improvement    Bohannon RW. Reference values for the five repetition sit to stand test: a descriptive metaanalysis of data from elders. Percept Mot Skills 2006; 103(1):215-222.       Mobility:    Transitional Movements: Constant moving in sitting.  Difficulty with all transitions.  Moaning   Gait: using cane.  Antalgic gait.  Maintains wide-stance with excessive lat trunk deviation; without rotation    Balance:    No falls per son.  Patient reports x1 ground fall in recent hx.  Balance NT formally.        TREATMENT/INTERVENTION:    Therapeutic Exercises:   Abdominal brace/isometric    Activity tolerance and post treatment pain report:   constant moaning & calling out during eval with any guided or passive procedures    Education:  [x]      Home exercise program provided.  Education was provided to the patient on the following topics: role of PT, plan of care, goals, need to push thru pain when chronic.  Patient indicated she understood this would be necessary; 'had to' in past & willing to again.  Patient verbalized understanding of the topics presented.    ASSESSMENT:   Anita Bell is a 41 y.o. female who presents with hx of chronic BP.  Physical therapy problems based on objective data include: limited active ROM & proximal strength, apparent asymmetry of trunk & LEs, & pain behavior (constant moaning, calling out; very slow to move) over-arching all attempts to intervene.  Patient will benefit from a trial of skilled intervention to address these impairments.     Rehabilitation potential is considered to be Guarded.  Factors which may influence rehabilitation potential include chronicity as well as pain behavior.  Patient will benefit from a trial of 8 physical therapy visits over 4 weeks (STG) to optimize improvement in these areas.  If successful, will continued another 4 wk (LTG)    PLAN OF CARE:   Recommendations and Planned Interventions:  [x]      Therapeutic Activities  [x]      Heat/Ice  [x]   Therapeutic Exercises  []      Ultrasound  [x]      Gait training  []      E-stim  [x]      Balance training  [x]      Home exercise program  [x]      Manual Therapy  []      TENS  [x]      Neuro Re-Ed  []       Edema management  [x]      Posture/Biomechanics  [x]      Pain management  []      Traction  []      Other:    Frequency/Duration:  Patient will be followed by physical therapy 1-2 times a week for  4 weeks to address goals as a trial.n  If successful, will continue another month    GOALS  Short term goals - trial period  Time frame: 4 wk  1. indep in HEP  2. Good attendance in PT: 75% scheduled visits  3. 10 min recumbent bike  4. Infrequent use of cane  5. Roland Morris drop to 18/24  6. 5x sit-stand to 18 sec or below  7. Able to stand with symmetry trunk with verbal cues & encouragement  8. No calling out or moaning with therapy    Long term goals  Time frame: 8 wk  1. indep in finalized HEP  2. Roland Morris at 12/24 or less  3. 5x sit-stand reduced to 15 sec or below  4. 3+ hip strength bilat  5. No further use of cane  6. SLR without BP     [x]      Patient has participated in goal setting and agrees to work toward plan of care.  [x]      Patient was instructed to call if questions or concerns arise.    Thank you for this referral.  Kathi Der, PT   Time Calculation: 37 mins    TREATMENT PLAN EFFECTIVE DATES:   06/20/2012 TO 08/15/12  I have read the above plan of care for [facility] and certify the need for skilled physical therapy services.    Physician Signature: ____________________________________________________    Date: _________________________________________________________________

## 2012-07-22 NOTE — Progress Notes (Signed)
[facility]  [address]  [place], [place]  [address]    OUTPATIENT physical Therapy discharge note      07/22/2012:  Patient will be discharged from physical therapy at this time.  Criteria for termination of care:    [x]         Patient has not returned to therapy  []         Patient has missed three or more visits without prior notification  []         Other:     Patient has been seen for initial eval only on 06/20/12.  Please refer to that document for any pertinent PT info available.  If you need anything further faxed to you, please contact us at [phone].    Thank you for this referral.  Kathi Der, PT

## 2012-11-05 NOTE — ED Provider Notes (Signed)
HPI Comments: Anita Bell is a 42 y.o. AAF who presents ambulatory to ED c/o 9/10 L wrist pain secondary to abscess x 2 days.  Pt reports drainage from site x today.  Pt denies tx for sxs.  Pt notes pain is exacerbated to palpation.  Pt denies hx of DM.  Pt specifically denies fever.    PCP: Anita Norfolk, MD  PMHx: significant for asthma   PSHx: significant for tubal ligation   Social Hx: (-) tobacco use; (-) EtOH consumption; (-) recreational drug use    There are no other complaints, changes or physical findings at this time.   Written by Willodean Rosenthal, ED Scribe, as dictated by Felipa Furnace, PA-C.     The history is provided by the patient.        Past Medical History   Diagnosis Date   ??? ADHD    ??? Depression    ??? Osteoarthritis    ??? Fibromyalgia    ??? Gastroenteritis    ??? Hydradenitis      suppurativa   ??? Other ill-defined conditions      chronic back pain   ??? Psychiatric disorder    ??? Asthma         Past Surgical History   Procedure Laterality Date   ??? Hx gyn       tubes tided         Family History   Problem Relation Age of Onset   ??? Alcohol abuse Neg Hx    ??? Arthritis-rheumatoid Neg Hx    ??? Asthma Neg Hx    ??? Bleeding Prob Neg Hx    ??? Cancer Neg Hx    ??? Diabetes Neg Hx    ??? Elevated Lipids Neg Hx    ??? Headache Neg Hx    ??? Heart Disease Neg Hx    ??? Hypertension Neg Hx    ??? Lung Disease Neg Hx    ??? Migraines Neg Hx    ??? Psychiatric Disorder Neg Hx    ??? Stroke Neg Hx    ??? Mental Retardation Neg Hx         History     Social History   ??? Marital Status: SINGLE     Spouse Name: N/A     Number of Children: N/A   ??? Years of Education: N/A     Occupational History   ??? Not on file.     Social History Main Topics   ??? Smoking status: Never Smoker    ??? Smokeless tobacco: Not on file   ??? Alcohol Use: No   ??? Drug Use: No   ??? Sexually Active:      Other Topics Concern   ??? Not on file     Social History Narrative    ** Merged History Encounter **                       ALLERGIES: Aspirin; Aspirin; Ibuprofen; and  Ibuprofen      Review of Systems   Constitutional: Negative.  Negative for fever.   HENT: Negative.    Eyes: Negative.    Respiratory: Negative.    Cardiovascular: Negative.    Gastrointestinal: Negative.    Genitourinary: Negative.    Musculoskeletal: Negative.    Skin: Positive for wound (abscess L wrist).   Neurological: Negative.    All other systems reviewed and are negative.        Filed Vitals:  11/05/12 2132   BP: 124/83   Pulse: 110   Temp: 99.9 ??F (37.7 ??C)   Resp: 16   Height: 5\' 5"  (1.651 m)   Weight: 69.4 kg (153 lb)   SpO2: 99%            Physical Exam   Nursing note and vitals reviewed.  Constitutional: She is oriented to person, place, and time. She appears well-developed and well-nourished. No distress.   HENT:   Head: Normocephalic and atraumatic.   Right Ear: External ear normal.   Left Ear: External ear normal.   Nose: Nose normal.   Mouth/Throat: Oropharynx is clear and moist. No oropharyngeal exudate.   Eyes: Conjunctivae and EOM are normal. Pupils are equal, round, and reactive to light. Right eye exhibits no discharge. Left eye exhibits no discharge. No scleral icterus.   Neck: Normal range of motion. Neck supple. No tracheal deviation present.   Cardiovascular: Normal rate, regular rhythm, normal heart sounds and intact distal pulses.  Exam reveals no gallop and no friction rub.    No murmur heard.  Pulmonary/Chest: Effort normal and breath sounds normal. No respiratory distress. She has no wheezes. She has no rales. She exhibits no tenderness.   Abdominal: Soft. Bowel sounds are normal. She exhibits no distension and no mass. There is no tenderness. There is no rebound and no guarding.   Musculoskeletal: She exhibits no edema and no tenderness.   Lymphadenopathy:     She has no cervical adenopathy.   Neurological: She is alert and oriented to person, place, and time. No cranial nerve deficit.   Skin: Skin is warm and dry. No rash noted. No erythema.   Erythema and swelling to lateral  aspect of L wrist, large pustule draining malodorous, purulent material    Psychiatric: She has a normal mood and affect. Her behavior is normal.   Written by Doreatha Lew. Sparkman, ED Scribe, as dictated by Felipa Furnace, PA-C.      MDM     Amount and/or Complexity of Data Reviewed:   Clinical lab tests:  Ordered and reviewed   Obtain history from someone other than the patient:  No   Review and summarize past medical records:  Yes      Procedures    Procedure Note - Incision and Drainage:   11:59 PM  Performed by: Felipa Furnace, PA-C  Complexity: simple   Skin prepped with shur clens and saline.  Sterile field established.  Anesthesia achieved with 0.5 mLs of Lidocaine 1% with epinephrine using a local infiltration.   Abscess to wrist was incised with # 11 blade, and of bloody purulent drainage was expressed.  Wound probed with hemostats and irrigated. Area was packed using 1/4 inch iodoform gauze.  Sterile dressing applied.    The procedure took 1-15 minutes, and pt tolerated well.  Written by Doreatha Lew Sparkman, ED Scribe, as dictated by Felipa Furnace, PA-C.          LABORATORY TESTS:  No results found for this or any previous visit (from the past 12 hour(s)).      MEDICATIONS GIVEN:  Medications   cephALEXin (KEFLEX) capsule 500 mg (500 mg Oral Given 11/05/12 2244)   oxyCODONE-acetaminophen (PERCOCET) 5-325 mg per tablet 1 Tab (1 Tab Oral Given 11/05/12 2244)   trimethoprim-sulfamethoxazole (BACTRIM DS, SEPTRA DS) 160-800 mg per tablet 2 Tab (2 Tabs Oral Given 11/05/12 2245)       IMPRESSION:  1. Abscess  PLAN:  1. Rx percocet, septra DS, keflex  2. F/U with PCP Anita Norfolk, MD for wound check in 2 days    Return to ED if worse     DISCHARGE NOTE:  12:06 AM  Patient has been reexamined.  Patient has no new complaints, changes, or physical findings.  Care plan outlined and precautions discussed.  All available results were reviewed with patient.  All medications were reviewed with patient.  Pt was  prescribed percocet, septra DS, and keflex. All of patient's questions and concerns were addressed.  Patient agrees to F/U as instructed with PCP Anita Norfolk, MD in 2 days for wound check and agrees to return to ED upon further deterioration.  Patient is ready to go home.  Written by Doreatha Lew Sparkman, ED Scribe, as dictated by Felipa Furnace, PA-C

## 2012-11-05 NOTE — ED Notes (Signed)
Bedside and Verbal shift change report given to Terecia,RN (oncoming nurse) by A.FLeming-Hoyt,RN (offgoing nurse).  Report given with SBAR, ED Summary, Intake/Output, MAR and Recent Results.

## 2012-11-05 NOTE — ED Notes (Signed)
Assumed care of pt. Pt presents with abscess on left wrist x2 days. Pt states that area started itching yesterday and notice swelling today. Pus draining from abscess with foul odor.

## 2012-11-06 MED ADMIN — trimethoprim-sulfamethoxazole (BACTRIM DS, SEPTRA DS) 160-800 mg per tablet 2 Tab: ORAL | @ 04:00:00 | NDC 51079012801

## 2012-11-06 MED ADMIN — cephALEXin (KEFLEX) capsule 500 mg: ORAL | @ 04:00:00 | NDC 68180012101

## 2012-11-06 MED ADMIN — oxyCODONE-acetaminophen (PERCOCET) 5-325 mg per tablet 1 Tab: ORAL | @ 04:00:00 | NDC 00406051223

## 2012-11-06 MED FILL — CEPHALEXIN 250 MG CAP: 250 mg | ORAL | Qty: 2

## 2012-11-06 MED FILL — TRIMETHOPRIM-SULFAMETHOXAZOLE 160 MG-800 MG TAB: 160-800 mg | ORAL | Qty: 2

## 2012-11-06 MED FILL — OXYCODONE-ACETAMINOPHEN 5 MG-325 MG TAB: 5-325 mg | ORAL | Qty: 1

## 2012-11-06 NOTE — Progress Notes (Signed)
Quick Note:    Already on Bactrim and Keflex  ______

## 2012-11-06 NOTE — ED Provider Notes (Signed)
I was personally available for consultation in the emergency department.  I have reviewed the chart and agree with the documentation recorded by the MLP, including the assessment, treatment plan, and disposition.  Dorianna Mckiver J Tatianna Ibbotson, MD

## 2012-11-06 NOTE — ED Notes (Signed)
PA at bedside to provide verbal and written d/c instructions at this time. All questions have been answered. Pt ambulatory from ED in stable condition with papers in hand

## 2012-11-07 MED ADMIN — cefTRIAXone (ROCEPHIN) 1 g in lidocaine (PF) (XYLOCAINE) 10 mg/mL (1 %) IM injection: INTRAMUSCULAR | @ 22:00:00 | NDC 63323049227

## 2012-11-07 MED FILL — CEFTRIAXONE 1 GRAM SOLUTION FOR INJECTION: 1 gram | INTRAMUSCULAR | Qty: 1

## 2012-11-07 MED FILL — XYLOCAINE-MPF 10 MG/ML (1 %) INJECTION SOLUTION: 10 mg/mL (1 %) | INTRAMUSCULAR | Qty: 2

## 2012-11-07 NOTE — ED Provider Notes (Addendum)
HPI Comments: Anita Bell is a 42 y.o. female who presents ambulatory to ED for wound check of L wrist abscess I & D x 2 days ago. Pt states she think the packing should be removed. Pt notes purulent discharge from site. She specifically denies any F/C and N/V/D.     PCP: Anita Norfolk, MD    PMHx significant for: gastroenteritis  PSHx significant for: tubal ligation  Social Hx: - tobacco, - EtOH    There are no further complaints, changes, or physical findings at this time.  Written by Anita Bell, ED Scribe, as dictated by Anita Bell.     The history is provided by the patient.        Past Medical History   Diagnosis Date   ??? ADHD    ??? Depression    ??? Osteoarthritis    ??? Fibromyalgia    ??? Gastroenteritis    ??? Hydradenitis      suppurativa   ??? Other ill-defined conditions      chronic back pain   ??? Psychiatric disorder    ??? Asthma         Past Surgical History   Procedure Laterality Date   ??? Hx gyn       tubes tided         Family History   Problem Relation Age of Onset   ??? Alcohol abuse Neg Hx    ??? Arthritis-rheumatoid Neg Hx    ??? Asthma Neg Hx    ??? Bleeding Prob Neg Hx    ??? Cancer Neg Hx    ??? Diabetes Neg Hx    ??? Elevated Lipids Neg Hx    ??? Headache Neg Hx    ??? Heart Disease Neg Hx    ??? Hypertension Neg Hx    ??? Lung Disease Neg Hx    ??? Migraines Neg Hx    ??? Psychiatric Disorder Neg Hx    ??? Stroke Neg Hx    ??? Mental Retardation Neg Hx         History     Social History   ??? Marital Status: SINGLE     Spouse Name: N/A     Number of Children: N/A   ??? Years of Education: N/A     Occupational History   ??? Not on file.     Social History Main Topics   ??? Smoking status: Never Smoker    ??? Smokeless tobacco: Not on file   ??? Alcohol Use: No   ??? Drug Use: No   ??? Sexually Active:      Other Topics Concern   ??? Not on file     Social History Narrative    ** Merged History Encounter **                       ALLERGIES: Aspirin; Aspirin; Ibuprofen; and Ibuprofen      Review of Systems    Constitutional: Negative.  Negative for fever and chills.   HENT: Negative.    Eyes: Negative.    Respiratory: Negative.    Cardiovascular: Negative.    Gastrointestinal: Negative.  Negative for nausea, vomiting and diarrhea.   Genitourinary: Negative.    Musculoskeletal: Negative.    Skin:        Abscess L wrist   Neurological: Negative.    All other systems reviewed and are negative.        Filed Vitals:  11/07/12 1601   BP: 117/79   Pulse: 82   Temp: 98.5 ??F (36.9 ??C)   Resp: 18   Weight: 71.4 kg (157 lb 6.5 oz)   SpO2: 98%            Physical Exam   Nursing note and vitals reviewed.  Constitutional: She is oriented to person, place, and time. She appears well-developed and well-nourished. No distress.   HENT:   Head: Normocephalic and atraumatic.   Right Ear: External ear normal.   Left Ear: External ear normal.   Nose: Nose normal.   Mouth/Throat: Oropharynx is clear and moist. No oropharyngeal exudate.   Eyes: Conjunctivae and EOM are normal. Pupils are equal, round, and reactive to light. Right eye exhibits no discharge. Left eye exhibits no discharge. No scleral icterus.   Neck: Normal range of motion. Neck supple. No JVD present. No tracheal deviation present.   Cardiovascular: Normal rate, regular rhythm, normal heart sounds and intact distal pulses.  Exam reveals no gallop and no friction rub.    No murmur heard.  Pulmonary/Chest: Effort normal and breath sounds normal. No respiratory distress. She has no wheezes. She has no rales. She exhibits no tenderness.   Abdominal: Soft. Bowel sounds are normal. She exhibits no distension and no mass. There is no tenderness. There is no rebound and no guarding.   Musculoskeletal: Normal range of motion. She exhibits no edema and no tenderness.   Lymphadenopathy:     She has no cervical adenopathy.   Neurological: She is alert and oriented to person, place, and time. She has normal reflexes. No cranial nerve deficit. She exhibits normal muscle tone. Coordination  normal.   Skin: Skin is warm and dry. She is not diaphoretic.   Purulent green drainage from L wrist abscess   Psychiatric: She has a normal mood and affect. Her behavior is normal. Judgment and thought content normal.    Written by Anita Bell, ED Scribe, as dictated by Anita Bell.    MDM     Amount and/or Complexity of Data Reviewed:    Review and summarize past medical records:  Yes  Progress:   Patient progress:  Stable      Procedures    Procedure Note - Wound Check:   4:17 PM  Performed by: Anita Bell  Complexity: complex   Abscess to L wrist was checked.  Packing removed. Green purulent drainage noted. Area was packed using 0.25 inch iodoform gauze.  Sterile dressing applied.    The procedure took 1-15 minutes, and pt tolerated well.  Written by Anita Bell, ED Scribe, as dictated by Anita Bell.     IMPRESSION:  1. Abscess        PLAN:  1. Percocet  2. F/U PCP as needed  Return to ED if worse     Discharge Note:  4:29 PM  Pt is being discharged from the hospital. All diagnostic results have been reviewed and discussed with pt. Care plan has been reviewed and pt understands all current sx, dx, tx, and rx. There are no new complaints, changes, or physical findings at this time. All questions have been addressed. All medications were reviewed with pt; d/c home with Percocet. Pt was instructed to and agrees to follow up with PCP, as well as return to ED upon further deterioration. Anita Bell is ready for discharge.   Written by Anita Bell, ED Scribe, as dictated by Anita Bell.  I was personally available for consultation in the emergency department.  I have reviewed the chart and agree with the documentation recorded by the [facility], including the assessment, treatment plan, and disposition.  Anita Gales, MD

## 2012-11-07 NOTE — ED Notes (Signed)
Pt removed band aid from L arm right at wrist.    Skin: The area was approximately 3cm in diameter and was open in the center. Wynona Dove PA removed packing and replaced with 1/4 inch iodoform. Pus was draining from the area. Placed clean/dry 4X4 after the packing. Pt tolerated well.

## 2012-11-07 NOTE — ED Notes (Signed)
Assumed care of pt. Pt states that she is here for a wound check after an abscess removal. Pt states "The swelling just went down at 0300 today." Call bell is within reach.

## 2012-11-07 NOTE — ED Notes (Signed)
Patient has been given discharge instructions by PA and patient was given the opportunity to ask questions.  Nurse also reinforced discharge instructions and patient has verbalized understanding.

## 2012-11-09 LAB — CULTURE, WOUND W GRAM STAIN

## 2014-05-26 ENCOUNTER — Emergency Department (HOSPITAL_COMMUNITY)
Admission: EM | Admit: 2014-05-26 | Discharge: 2014-05-26 | Disposition: A | Discharge status: Home / Self Care | Payer: Medicaid - Out of State | Attending: Emergency Medicine | Admitting: Emergency Medicine

## 2014-05-26 ENCOUNTER — Encounter (HOSPITAL_COMMUNITY): Payer: Self-pay | Admitting: Emergency Medicine

## 2014-05-26 DIAGNOSIS — M545 Low back pain, unspecified: Secondary | ICD-10-CM | POA: Insufficient documentation

## 2014-05-26 DIAGNOSIS — M549 Dorsalgia, unspecified: Secondary | ICD-10-CM | POA: Diagnosis present

## 2014-05-26 DIAGNOSIS — G8929 Other chronic pain: Secondary | ICD-10-CM | POA: Diagnosis not present

## 2014-05-26 DIAGNOSIS — M2550 Pain in unspecified joint: Secondary | ICD-10-CM | POA: Insufficient documentation

## 2014-05-26 HISTORY — DX: Other specified behavioral and emotional disorders with onset usually occurring in childhood and adolescence: F98.8

## 2014-05-26 HISTORY — DX: Unspecified osteoarthritis, unspecified site: M19.90

## 2014-05-26 HISTORY — DX: Bipolar disorder, unspecified: F31.9

## 2014-05-26 HISTORY — DX: Multiple sclerosis: G35

## 2014-05-26 HISTORY — DX: Anxiety disorder, unspecified: F41.9

## 2014-05-26 HISTORY — DX: Sciatica, unspecified side: M54.30

## 2014-05-26 HISTORY — DX: Leiomyoma of uterus, unspecified: D25.9

## 2014-05-26 HISTORY — DX: Chronic obstructive pulmonary disease, unspecified: J44.9

## 2014-05-26 HISTORY — DX: Post-traumatic stress disorder, unspecified: F43.10

## 2014-05-26 HISTORY — DX: Essential (primary) hypertension: I10

## 2014-05-26 HISTORY — DX: Overweight: E66.3

## 2014-05-26 HISTORY — DX: Migraine, unspecified, not intractable, without status migrainosus: G43.909

## 2014-05-26 HISTORY — DX: Fibromyalgia: M79.7

## 2014-05-26 HISTORY — DX: Dysmenorrhea, unspecified: N94.6

## 2014-05-26 MED ORDER — ONDANSETRON 4 MG PO TBDP
4.0000 mg | ORAL_TABLET | Freq: Once | ORAL | Status: AC
  Administered 2014-05-26: 4 mg via ORAL
  Filled 2014-05-26: qty 1

## 2014-05-26 MED ORDER — METHOCARBAMOL 500 MG PO TABS: 500.0000 mg | ORAL_TABLET | Freq: Two times a day (BID) | ORAL | Status: DC

## 2014-05-26 MED ORDER — GABAPENTIN 300 MG PO CAPS
300.0000 mg | ORAL_CAPSULE | Freq: Once | ORAL | Status: AC
  Administered 2014-05-26: 300 mg via ORAL
  Filled 2014-05-26: qty 1

## 2014-05-26 MED ORDER — HYDROCODONE-ACETAMINOPHEN 5-325 MG PO TABS
1.0000 | ORAL_TABLET | Freq: Once | ORAL | Status: AC
  Administered 2014-05-26: 1 via ORAL
  Filled 2014-05-26: qty 1

## 2014-05-26 MED ORDER — GABAPENTIN 600 MG PO TABS: 300.0000 mg | ORAL_TABLET | Freq: Once | ORAL | Status: DC

## 2014-05-26 MED ORDER — IBUPROFEN 800 MG PO TABS: 800.0000 mg | ORAL_TABLET | Freq: Three times a day (TID) | ORAL | Status: DC

## 2014-05-26 NOTE — Discharge Instructions (Signed)
Please find a primary care provider by using resources below for further management of your health condition.    Chronic Back Pain  When back pain lasts longer than 3 months, it is called chronic back pain.People with chronic back pain often go through certain periods that are more intense (flare-ups).  CAUSES Chronic back pain can be caused by wear and tear (degeneration) on different structures in your back. These structures include:  The bones of your spine (vertebrae) and the joints surrounding your spinal cord and nerve roots (facets).  The strong, fibrous tissues that connect your vertebrae (ligaments). Degeneration of these structures may result in pressure on your nerves. This can lead to constant pain. HOME CARE INSTRUCTIONS  Avoid bending, heavy lifting, prolonged sitting, and activities which make the problem worse.  Take brief periods of rest throughout the day to reduce your pain. Lying down or standing usually is better than sitting while you are resting.  Take over-the-counter or prescription medicines only as directed by your caregiver. SEEK IMMEDIATE MEDICAL CARE IF:   You have weakness or numbness in one of your legs or feet.  You have trouble controlling your bladder or bowels.  You have nausea, vomiting, abdominal pain, shortness of breath, or fainting. Document Released: 11/15/2004 Document Revised: 12/31/2011 Document Reviewed: 09/22/2011 [facility] Patient Information 2015 [place], [place]. This information is not intended to replace advice given to you by your health care provider. Make sure you discuss any questions you have with your health care provider.   Emergency Department Resource Guide 1) Find a Doctor and Pay Out of Pocket Although you won't have to find out who is covered by your insurance plan, it is a good idea to ask around and get recommendations. You will then need to call the office and see if the doctor you have chosen will accept you as a new  patient and what types of options they offer for patients who are self-pay. Some doctors offer discounts or will set up payment plans for their patients who do not have insurance, but you will need to ask so you aren't surprised when you get to your appointment.  2) Contact Your Local Health Department Not all health departments have doctors that can see patients for sick visits, but many do, so it is worth a call to see if yours does. If you don't know where your local health department is, you can check in your phone book. The CDC also has a tool to help you locate your state's health department, and many state websites also have listings of all of their local health departments.  3) Find a [facility] If your illness is not likely to be very severe or complicated, you may want to try a walk in clinic. These are popping up all over the country in pharmacies, drugstores, and shopping centers. They're usually staffed by nurse practitioners or physician assistants that have been trained to treat common illnesses and complaints. They're usually fairly quick and inexpensive. However, if you have serious medical issues or chronic medical problems, these are probably not your best option.  No Primary Care Doctor: - Call Health Connect at  [phone] - they can help you locate a primary care doctor that  accepts your insurance, provides certain services, etc. - Physician Referral Service- [phone]  Chronic Pain Problems: Organization         Address  Phone   Notes  [facility]  [phone] Patients need to be referred  by their primary care doctor.   Medication Assistance: Organization         Address  Phone   Notes  [facility] Medication [facility] [address] [place], [place] [address] [phone] --Must be a resident of [facility] -- Must have NO insurance coverage whatsoever (no Medicaid/ Medicare, etc.) -- The pt. MUST have a primary  care doctor that directs their care regularly and follows them in the community   MedAssist  [phone]   [employer]  [phone]    Agencies that provide inexpensive medical care: Organization         Address  Phone   Notes  [place]  [phone]   [facility]    [phone]   [facility] [place], [place] [address] [phone]   [place] [address] [place] [phone]   Planned Parenthood    [phone]   [facility]    [phone]   [place] [address], [place] Phone:  [phone], Fax:  [phone] Hours of Operation:  9 am - 6 pm, M-F.  Also accepts Medicaid/Medicare and self-pay.  [facility] for [place], [address], [place] Phone: [phone], Fax: [phone]. Hours of Operation:  8:30 am - 5:30 pm, M-F.  Also accepts Medicaid and self-pay.  [facility] [place] [address] [place] Phone: [phone]   [place], [place], [place] [phone], Ext. [phone] Mondays & Thursdays: 7-9 AM.  First 15 patients are seen on a first come, first serve basis.    [place] Providers:  Organization         Address  Phone   Notes  [facility] [address], [place] [phone] Also accepts self-pay patients.  [facility] [address], [place]  [phone]   [place], [address], [place] [phone]   [facility] [address] [place] [phone]   Lucianne Lei [address], [place]   [phone] Only accepts [place] Access [place] patients after they have their name applied to their card.   Self-Pay (no insurance) in [facility]:  Organization         Address  Phone   Notes  Sickle Cell Patients, [facility] [place] [phone]   [facility] [place] [phone]   [facility] [place]  [place], [place], [place] [phone]   [facility]/Dr. Osei-Bonsu  [address], [place] or [address], [place] [phone] Phone number for both [place] and [place] locations is the same.  Urgent Medical and [facility] [address] [place] [phone]   [facility] [address] [place] or [address] [phone]   [facility] [address], [place] [phone], phone; [phone], fax Sees patients 1st and 3rd Saturday of every month.  Must not qualify for public or private insurance (i.e. Medicaid, Medicare, [place] Health Choice, Veterans' Benefits)  Household income should be no more  than 200% of the poverty level The clinic cannot treat you if you are pregnant or think you are pregnant  Sexually transmitted diseases are not treated at the clinic.    Dental Care: Organization         Address  Phone  Notes  [facility] Department of [facility] [place] [phone] Accepts children up to age 7 who are enrolled in [place] or [place]; pregnant women with a Medicaid card; and children who have applied for Medicaid or [place] Health Choice, but were declined, whose parents can pay a reduced fee at time of service.  [facility] Department of [facility]  [address], [place] [phone] Accepts children up to age 77 who are enrolled in [place] or [place]; pregnant women with a Medicaid card; and children who have applied for Medicaid or  Health Choice, but were declined, whose parents can  pay a reduced fee at time of service.  [place] Adult Dental Access PROGRAM  [place] [phone] Patients are seen by appointment only. Walk-ins are not accepted. [place] will see patients 74 years of age and older. Monday - Tuesday (8am-5pm) Most Wednesdays (8:30-5pm) $30 per visit, cash only  [facility] Adult [facility] Access PROGRAM  [address], [facility] [phone] Patients are seen by appointment only. Walk-ins are not accepted. [place] will see patients 76 years of age and older. One Wednesday Evening (Monthly: Volunteer Based).  $30 per visit, cash only  [place]  [phone] for adults; Children under age 46, call Graduate Pediatric Dentistry at [phone]. Children aged 50-14, please call [phone] to request a pediatric application.  Dental services are provided in all areas of dental care including fillings, crowns and bridges, complete and partial dentures, implants, gum treatment, root canals, and extractions. Preventive care is also provided. Treatment is provided to both adults and children. Patients are selected via a lottery and there is often a waiting list.   [facility] [address] [place]  [phone] [email or web address]   [facility] [address] [place], [place] [phone], Ext. [phone] Second and Fourth Thursday of each month, opens at 6:30 AM; Clinic ends at 9 AM.  Patients are seen on a first-come first-served basis, and a limited number are seen during each clinic.   [facility]  [address] Hillard Danker [place], [place] [phone]   Eligibility Requirements You must have lived in [place], [place], or [place] counties for at least the last three months.   You cannot be eligible for state or federal sponsored [employer], including [employer], [place], or [employer].   You generally cannot be eligible for healthcare  insurance through your employer.    How to apply: Eligibility screenings are held every Tuesday and Wednesday afternoon from 1:00 pm until 4:00 pm. You do not need an appointment for the interview!  [facility] [address] [place], [place]   [place]  Branch Department  [place]  [phone]    Behavioral Health Resources in the Community: Intensive Outpatient Programs Organization         Address  Phone  Notes  Calvin Gladwin. [address] [place], [place]   [facility] [address] [place], [place]   ADS: [facility] [address] [place], [place]   [place] [address],  [place], [place] or [phone]   Substance Abuse Resources Organization         Address  Phone  Notes  Alcohol and Drug Services  [phone]   [place]  [phone]   [facility] [place]   Chinita Pester  [phone]   Residential & Outpatient Substance Abuse Program  [phone]   Psychological Services Organization         Address  Phone  Notes  [facility]  [place]  [phone]   [place] [address] [place] or [phone]    Mobile Crisis Teams Organization         Address  Phone  Notes  [facility], Mobile Crisis Care Unit  [phone]   [facility]  [address] [place], [place]   [place] [address], [place] [phone]    Self-Help/Support Groups Organization         Address  Phone             Notes  [place]. of [place] - variety of support groups  [place] Call for more information  Narcotics Anonymous (NA),  [facility] [address], [employer] [place]  2 meetings at this location   Special educational needs teacher         Address  Phone  Notes  ASAP Residential Treatment [place],    [place]  [phone]   [facility]  [address], [place] [address]671, [place], [place]   [place], [place] [phone] Admissions: 8am-3pm M-F  Incentives Substance [place] [address],    [place], [place] [phone]   [facility] [address] [place], [place], [place]   [facility] [address]  [place], [place]   [facility] Programs - Intensive Outpatient [address] Kristeen [address], [place], [place]   [facility] ([place].,  [place], [place] [phone]-(414)[phone] or [phone]   [facility] Services (RTS) [address], [place], [place] Accepts Medicaid  [facility] [place] [address],  [place] [phone])665-4707 Substance Abuse/Addiction Treatment   [facility] Organization         Address  Phone  Notes  [facility]  [phone]   Domenic Schwab, PhD [address] Arlis Porta [place], [place]   (972)[phone] or [phone]   [place], [place] [phone]   [facility] [address] [place], [place] [phone] Insurance/Medicaid/sponsorship through [facility] and Families [address] [place]                                    [place], [place] [phone] [place] [address], [place] [phone]    Dr. Adele Schilder  [phone]   [facility] Dept. 1) [address], [place] 2) [place] 3)  [address] [place]  [address], [place] [phone]  [phone]    [facility] Child Abuse Hotline (217)444-5194 or 539-130-8857 (After Hours)
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
## 2014-05-26 NOTE — ED Notes (Signed)
Requesting medication refill. Pt reports that her pain medication was lost in transit to [place]. Reports old sciatic pain on l/side.

## 2014-05-26 NOTE — ED Provider Notes (Signed)
Medical screening examination/treatment/procedure(s) were performed by non-physician practitioner and as supervising physician I was immediately available for consultation/collaboration.   EKG Interpretation None       Orlie Dakin, MD 05/26/14 1627

## 2014-06-27 ENCOUNTER — Encounter (HOSPITAL_COMMUNITY): Payer: Self-pay | Admitting: Emergency Medicine

## 2014-06-27 ENCOUNTER — Emergency Department (HOSPITAL_COMMUNITY): Payer: Medicaid Other

## 2014-06-27 ENCOUNTER — Emergency Department (HOSPITAL_COMMUNITY)
Admission: EM | Admit: 2014-06-27 | Discharge: 2014-06-27 | Disposition: A | Discharge status: Home / Self Care | Payer: Medicaid Other | Attending: Emergency Medicine | Admitting: Emergency Medicine

## 2014-06-27 DIAGNOSIS — M545 Low back pain, unspecified: Secondary | ICD-10-CM

## 2014-06-27 DIAGNOSIS — Z8659 Personal history of other mental and behavioral disorders: Secondary | ICD-10-CM | POA: Insufficient documentation

## 2014-06-27 DIAGNOSIS — J4489 Other specified chronic obstructive pulmonary disease: Secondary | ICD-10-CM | POA: Insufficient documentation

## 2014-06-27 DIAGNOSIS — W1809XA Striking against other object with subsequent fall, initial encounter: Secondary | ICD-10-CM | POA: Diagnosis not present

## 2014-06-27 DIAGNOSIS — IMO0002 Reserved for concepts with insufficient information to code with codable children: Secondary | ICD-10-CM | POA: Diagnosis present

## 2014-06-27 DIAGNOSIS — I10 Essential (primary) hypertension: Secondary | ICD-10-CM | POA: Diagnosis not present

## 2014-06-27 DIAGNOSIS — Y9389 Activity, other specified: Secondary | ICD-10-CM | POA: Diagnosis not present

## 2014-06-27 DIAGNOSIS — J449 Chronic obstructive pulmonary disease, unspecified: Secondary | ICD-10-CM | POA: Insufficient documentation

## 2014-06-27 DIAGNOSIS — Z8742 Personal history of other diseases of the female genital tract: Secondary | ICD-10-CM | POA: Diagnosis not present

## 2014-06-27 DIAGNOSIS — S9002XA Contusion of left ankle, initial encounter: Secondary | ICD-10-CM

## 2014-06-27 DIAGNOSIS — Z8669 Personal history of other diseases of the nervous system and sense organs: Secondary | ICD-10-CM | POA: Diagnosis not present

## 2014-06-27 DIAGNOSIS — S9000XA Contusion of unspecified ankle, initial encounter: Secondary | ICD-10-CM | POA: Diagnosis not present

## 2014-06-27 DIAGNOSIS — Z8739 Personal history of other diseases of the musculoskeletal system and connective tissue: Secondary | ICD-10-CM | POA: Insufficient documentation

## 2014-06-27 DIAGNOSIS — Y929 Unspecified place or not applicable: Secondary | ICD-10-CM | POA: Diagnosis not present

## 2014-06-27 DIAGNOSIS — W19XXXA Unspecified fall, initial encounter: Secondary | ICD-10-CM

## 2014-06-27 DIAGNOSIS — Z79899 Other long term (current) drug therapy: Secondary | ICD-10-CM | POA: Diagnosis not present

## 2014-06-27 MED ORDER — HYDROMORPHONE HCL PF 1 MG/ML IJ SOLN
1.0000 mg | Freq: Once | INTRAMUSCULAR | Status: AC
  Administered 2014-06-27: 1 mg via INTRAMUSCULAR
  Filled 2014-06-27: qty 1

## 2014-06-27 MED ORDER — CYCLOBENZAPRINE HCL 10 MG PO TABS: 10.0000 mg | ORAL_TABLET | Freq: Two times a day (BID) | ORAL | Status: AC | PRN

## 2014-06-27 MED ORDER — OXYCODONE-ACETAMINOPHEN 5-325 MG PO TABS
1.0000 | ORAL_TABLET | Freq: Once | ORAL | Status: AC
  Administered 2014-06-27: 1 via ORAL
  Filled 2014-06-27: qty 1

## 2014-06-27 MED ORDER — OXYCODONE-ACETAMINOPHEN 5-325 MG PO TABS: 1.0000 | ORAL_TABLET | Freq: Four times a day (QID) | ORAL | Status: AC | PRN

## 2014-06-27 NOTE — ED Provider Notes (Signed)
CSN: [id number]     Arrival date & time 06/27/14  0945 History   First MD Initiated Contact with Patient 06/27/14 [phone]     Chief Complaint  Patient presents with  . Fall  . Back Pain     (Consider location/radiation/quality/duration/timing/severity/associated sxs/prior Treatment) Patient is a 43 y.o. female presenting with fall and back pain. The history is provided by the patient.  Fall This is a new problem. The current episode started less than 1 hour ago. Episode frequency: once. The problem has not changed since onset.Pertinent negatives include no chest pain, no abdominal pain, no headaches and no shortness of breath. Nothing aggravates the symptoms. Nothing relieves the symptoms. She has tried nothing for the symptoms. The treatment provided no relief.  Back Pain Associated symptoms: no abdominal pain, no chest pain, no dysuria, no fever and no headaches     Past Medical History  Diagnosis Date  . Sciatic pain   . Bipolar 1 disorder   . Anxiety   . Migraine   . PTSD (post-traumatic stress disorder)   . Overweight   . Uterine fibroid   . Multiple sclerosis   . Fibromyalgia   . Arthritis   . Dysmenorrhea   . COPD (chronic obstructive pulmonary disease)   . ADD (attention deficit disorder)   . Hypertension    Past Surgical History  Procedure Laterality Date  . Tubal ligation     Family History  Problem Relation Age of Onset  . Hypertension Father   . Diabetes Father    History  Substance Use Topics  . Smoking status: Never Smoker   . Smokeless tobacco: Not on file  . Alcohol Use: No   OB History   Grav Para Term Preterm Abortions TAB SAB Ect Mult Living                 Review of Systems  Constitutional: Negative for fever and fatigue.  HENT: Negative for congestion and drooling.   Eyes: Negative for pain.  Respiratory: Negative for cough and shortness of breath.   Cardiovascular: Negative for chest pain.  Gastrointestinal: Negative for nausea, vomiting,  abdominal pain and diarrhea.  Genitourinary: Negative for dysuria and hematuria.  Musculoskeletal: Negative for back pain, gait problem and neck pain.  Skin: Negative for color change.  Neurological: Negative for dizziness and headaches.  Hematological: Negative for adenopathy.  Psychiatric/Behavioral: Negative for behavioral problems.  All other systems reviewed and are negative.     Allergies  Aspirin  Home Medications   Prior to Admission medications   Medication Sig Start Date End Date Taking? Authorizing Provider  albuterol (PROVENTIL HFA;VENTOLIN HFA) 108 (90 BASE) MCG/ACT inhaler Inhale 1 puff into the lungs every 6 (six) hours as needed for wheezing or shortness of breath.   Yes Historical Provider, MD  budesonide-formoterol (SYMBICORT) 160-4.5 MCG/ACT inhaler Inhale 2 puffs into the lungs 2 (two) times daily.   Yes Historical Provider, MD  methocarbamol (ROBAXIN) 750 MG tablet Take 750 mg by mouth 4 (four) times daily.   Yes Historical Provider, MD   BP 136/95  Pulse 63  Temp(Src) 97.5 F (36.4 C) (Oral)  Resp 22  SpO2 100% Physical Exam  Nursing note and vitals reviewed. Constitutional: She is oriented to person, place, and time. She appears well-developed and well-nourished.  HENT:  Head: Normocephalic.  Mouth/Throat: No oropharyngeal exudate.  Eyes: Conjunctivae and EOM are normal. Pupils are equal, round, and reactive to light.  Neck: Normal range of motion. Neck supple.  Cardiovascular: Normal rate, regular rhythm, normal heart sounds and intact distal pulses.  Exam reveals no gallop and no friction rub.   No murmur heard. Pulmonary/Chest: Effort normal and breath sounds normal. No respiratory distress. She has no wheezes.  Abdominal: Soft. Bowel sounds are normal. There is no tenderness. There is no rebound and no guarding.  Musculoskeletal: Normal range of motion. She exhibits tenderness. She exhibits no edema.  Diffuse lower lumbar tenderness to  palpation.  Mild tenderness to palpation of the medial malleolus of the left ankle.  2+ distal pulses diffusely.  Normal strength in bilateral lower extremities. Normal sensation in bilateral lower extremities.  Neurological: She is alert and oriented to person, place, and time.  Skin: Skin is warm and dry.  Psychiatric: She has a normal mood and affect. Her behavior is normal.    ED Course  Procedures (including critical care time) Labs Review Labs Reviewed - No data to display  Imaging Review Dg Lumbar Spine Complete  06/27/2014   CLINICAL DATA:  Fall this morning. Pain in right lower back and left ankle. Remote history of trauma as well.  EXAM: LUMBAR SPINE - COMPLETE 4+ VIEW  COMPARISON:  None.  FINDINGS: Five lumbar type vertebral bodies. Minimal convex left lumbar spine curvature. Sacroiliac joints are symmetric. Maintenance of vertebral body height and alignment. Intervertebral disc height is decreased at L4-5. Mild endplate sclerosis.  IMPRESSION: Degenerative disc disease at L4-5.  No acute osseous abnormality.   Electronically Signed   By: Abigail Miyamoto M.D.   On: 06/27/2014 11:08   Dg Ankle Complete Left  06/27/2014   CLINICAL DATA:  Fall with pain in the medial left ankle.  EXAM: LEFT ANKLE COMPLETE - 3+ VIEW  COMPARISON:  None.  FINDINGS: No acute fracture or dislocation is identified. Ankle mortise shows normal alignment. Soft tissue swelling is seen over the medial malleolus. No bony lesions or significant arthropathy.  IMPRESSION: No acute fracture identified.   Electronically Signed   By: Aletta Edouard M.D.   On: 06/27/2014 11:21     EKG Interpretation None      MDM   Final diagnoses:  Fall, initial encounter  Midline low back pain without sciatica  Ankle contusion, left, initial encounter    10:29 AM 43 y.o. female with a history of chronic low back pain stemming from a fall in the past who presents with a fall. She states that she has difficulty ambulating at  baseline and typically uses a cane. She states that she was trying to take a shower by herself this morning and fell getting out of the bathtub. She hit her lower back on the side of the bathtub but denies hitting her head or loss of consciousness. She currently complains of left ankle and low back pain. Will get pain control and screening imaging.   12:24 PM: I interpreted/reviewed the labs and/or imaging which were non-contributory. Pt feeling better. I have discussed the diagnosis/risks/treatment options with the patient and believe the pt to be eligible for discharge home to follow-up and estab w/ a pcp as she is new to the area. We also discussed returning to the ED immediately if new or worsening sx occur. We discussed the sx which are most concerning (e.g., worsening pain, weakness, numbness) that necessitate immediate return. Medications administered to the patient during their visit and any new prescriptions provided to the patient are listed below.  Medications given during this visit Medications  HYDROmorphone (DILAUDID) injection 1 mg (1 mg  Intramuscular Given 06/27/14 1032)  oxyCODONE-acetaminophen (PERCOCET/ROXICET) 5-325 MG per tablet 1 tablet (1 tablet Oral Given 06/27/14 1031)    Discharge Medication List as of 06/27/2014 12:28 PM    START taking these medications   Details  cyclobenzaprine (FLEXERIL) 10 MG tablet Take 1 tablet (10 mg total) by mouth 2 (two) times daily as needed for muscle spasms., Starting 06/27/2014, Until Discontinued, Print    oxyCODONE-acetaminophen (PERCOCET) 5-325 MG per tablet Take 1 tablet by mouth every 6 (six) hours as needed for moderate pain., Starting 06/27/2014, Until Discontinued, Print         Pamella Pert, MD 06/28/14 [phone]

## 2014-06-27 NOTE — ED Notes (Signed)
Bed: XB93 Expected date:  Expected time:  Means of arrival:  Comments: Fall

## 2014-06-27 NOTE — Discharge Instructions (Signed)
°Emergency Department Resource Guide °1) Find a Doctor and Pay Out of Pocket °Although you won't have to find out who is covered by your insurance plan, it is a good idea to ask around and get recommendations. You will then need to call the office and see if the doctor you have chosen will accept you as a new patient and what types of options they offer for patients who are self-pay. Some doctors offer discounts or will set up payment plans for their patients who do not have insurance, but you will need to ask so you aren't surprised when you get to your appointment. ° °2) Contact Your Local Health Department °Not all health departments have doctors that can see patients for sick visits, but many do, so it is worth a call to see if yours does. If you don't know where your local health department is, you can check in your phone book. The CDC also has a tool to help you locate your state's health department, and many state websites also have listings of all of their local health departments. ° °3) Find a Walk-in Clinic °If your illness is not likely to be very severe or complicated, you may want to try a walk in clinic. These are popping up all over the country in pharmacies, drugstores, and shopping centers. They're usually staffed by nurse practitioners or physician assistants that have been trained to treat common illnesses and complaints. They're usually fairly quick and inexpensive. However, if you have serious medical issues or chronic medical problems, these are probably not your best option. ° °No Primary Care Doctor: °- Call [facility] at  832-8000 - they can help you locate a primary care doctor that  accepts your insurance, provides certain services, etc. °- Physician Referral Service- 1-800-533-3463 ° °Chronic Pain Problems: °Organization         Address  Phone   Notes  °[facility]  (336) 297-2271 Patients need to be referred by their primary care doctor.  ° °Medication  Assistance: °Organization         Address  Phone   Notes  °[place] [facility] [address] °[place], [place] [address] (336) 641-8030 --Must be a resident of [place] °-- Must have NO insurance coverage whatsoever (no Medicaid/ Medicare, etc.) °-- The pt. MUST have a primary care doctor that directs their care regularly and follows them in the community °  °[facility]  (866) 331-1348   °[employer]  (888) 892-1162   ° °Agencies that provide inexpensive medical care: °Organization         Address  Phone   Notes  °[facility]  (336) 832-8035   °[facility]    (336) 832-7272   °[facility] [address] °[place], [place] [address] (336) 832-4777   °[facility] [address], °[place] (336) 271-4999   °[facility]    (336) 373-0678   °[facility]    (336) 272-1050   °[facility] ° [address], [place] Phone:  (336) 832-4444, Fax:  (336) 832-4440 Hours of Operation:  9 am - 6 pm, M-F.  Also accepts Medicaid/Medicare and self-pay.  °[facility] ° [address], [place] Phone: (336) 832-3150, Fax: (336) [phone]. Hours of Operation:  8:30 am - 5:30 pm, M-F.  Also accepts Medicaid and self-pay.  °[facility] [place] [address], [place] Phone: (336) 878-6027   °[facility] [address], [place], [place] (336)723-1848, Ext. 123 Mondays & Thursdays: 7-9 AM.  First 15 patients are seen on a first come, first serve basis. °  ° °Medicaid-accepting [place] Providers: ° °Organization         Address  Phone   Notes  °[facility] [address], [place] (336) 641-2100 Also accepts self-pay patients.  °[facility] [address], [place] ° (336) 856-9996   °[facility] [address], [place]  (336) 288-8857   °[facility] [address] [place] [address], [place] (336) 299-7000   °Veita Bland [address], [place]  ° (336) 373-1557 Only accepts [place] Access Medicaid patients after they have their name applied to their card.  ° °Self-Pay (no insurance) in [place]: ° °Organization         Address  Phone   Notes  °Sickle Cell Patients, [facility] [address], [place] (336) 832-1970   °[facility] [address], [place] (336) 832-4400   °[facility] [place] ° [address], [place] (336) 992-4800   °[facility]/Dr. Osei-Bonsu ° [address] [place] [address], [place] or [address], [place] (336) 841-8500 Phone number for both [place] and [place] locations is the same.  °[facility] [address], [place] (336) 299-0000   °[facility] [place] [address] [place] [address], [place] or [address] (336) 852-7530 °(336) 878-2260   °[facility] [address], [place] (336) 350-1642, phone; (336) 294-5005, fax Sees patients 1st and 3rd Saturday of every month.  Must not qualify for public or private insurance (i.e. Medicaid, Medicare, [place] Health Choice, Veterans' Benefits) • Household income should be no more than 200% of the poverty level •The clinic cannot treat you if you are pregnant or think you are pregnant • Sexually transmitted diseases are not treated at the clinic.  ° ° °Dental Care: °Organization         Address  Phone  Notes  °[place] [facility] [address], [place] (336) 641-6152 Accepts children up to age 21 who are enrolled in Medicaid or [place] Health Choice; pregnant women with a Medicaid card; and children who have applied for Medicaid or [place] Health Choice, but were declined, whose parents can pay a reduced fee at time of service.  °[place]  [facility] [place]  [address], [place] (336) 641-7733 Accepts children up to age 21 who are enrolled in Medicaid or [place] Health Choice; pregnant women with a Medicaid card; and children who have applied for Medicaid or  Health Choice, but were declined, whose parents can pay a reduced fee at time of service.  °[facility] ° [address], [place] (336) 641-4533 Patients are seen by appointment only. Walk-ins are not accepted. [facility] will see patients 18 years of age and older. °Monday - Tuesday (8am-5pm) °Most Wednesdays (8:30-5pm) °$30 per visit, cash only  °[facility] ° [address], [place] (336) 641-4533 Patients are seen by appointment only. Walk-ins are not accepted. [facility] will see patients 18 years of age and older. °One   Wednesday Evening (Monthly: Volunteer Based).  $30 per visit, cash only  °[facility]  (919) 537-3737 for adults; Children under age 4, call Graduate Pediatric Dentistry at (919) [phone]. Children aged 4-14, please call (919) [phone] to request a pediatric application. ° Dental services are provided in all areas of dental care including fillings, crowns and bridges, complete and partial dentures, implants, gum treatment, root canals, and extractions. Preventive care is also provided. Treatment is provided to both adults and children. °Patients are selected via a lottery and there is often a waiting list. °  °[facility] [address], °[place] ° (336) 763-8833 [email or web address] °  °[facility] [address], [place], [place] (336)723-1848, Ext. 123 Second and Fourth Thursday of each month, opens at 6:30 AM; Clinic ends at 9 AM.  Patients are seen on a first-come first-served basis, and a limited number are seen during each clinic.  ° °[facility] ° [address], [place], [place] (336) 723-7904    Eligibility Requirements °You must have lived in [place], [place], or [place] counties for at least the last three months. °  You cannot be eligible for state or federal sponsored healthcare insurance, including Veterans Administration, Medicaid, or Medicare. °  You generally cannot be eligible for healthcare insurance through your employer.  °  How to apply: °Eligibility screenings are held every Tuesday and Wednesday afternoon from 1:00 pm until 4:00 pm. You do not need an appointment for the interview!  °[facility] [address], [place], [place] 336-631-2330   °[facility]  336-342-8273   °[facility]  336-703-3100   °[facility]  336-570-6415   ° °Behavioral Health Resources in the Community: °Intensive Outpatient Programs °Organization         Address  Phone  Notes  °[facility] [address], [place], [place] 336-878-6098   °[facility] [address], [place], [place] 336-832-9800   °[facility] [address], [place], [place] ° 336-882-2125   °[facility] [address],  °[place], [place] 1-800-853-5163 or 336-641-4981   °Substance Abuse Resources °Organization         Address  Phone  Notes  °[facility]  336-882-2125   °[facility]  336-784-9470   °[facility]  336-285-9073   °[facility]  336-845-3988   °Residential & Outpatient Substance Abuse Program  1-800-659-3381   °Psychological Services °Organization         Address  Phone  Notes  °[facility]  336- 832-9600   °[facility]  336- 378-7881   °[facility] [address], [place] 1-800-853-5163 or 336-641-4981   ° °Mobile Crisis Teams °Organization         Address  Phone  Notes  °[facility], Mobile Crisis Care Unit  1-877-626-1772   °Assertive °[facility] ° [address]  [place], [place] 336-834-9664   °Sharon DeEsch [address] °[place] 336-554-5454   ° °Self-Help/Support Groups °Organization         Address  Phone             Notes  °[facility] - variety of support groups  336- 373-1402 Call for more information  °Narcotics Anonymous (NA), [facility] [address], °[place]  2 meetings at this location  ° °  Residential Treatment Programs °Organization         Address  Phone  Notes  °[facility] [address],    °[place]  1-866-801-8205   °[facility] ° [address], [place], [place] 704-293-8524   °[facility] [address], [place] 336-845-3988 Admissions: 8am-3pm M-F  °[facility] [address]    °[place], [place] 336-841-1104   °[facility] [address], [place], [place] 336-379-7146   °[facility] [address]  °[place], [place] 336-285-9073   °[facility] - Intensive Outpatient [address], [place], [place] 336-852-3033   °[facility] [address]  °[place], [place] 1-877-615-2722 or 336-784-9470   °[facility] [address] [place], [place] 336-227-7417 Accepts Medicaid  °[facility] [address]  ° [place] 1-800-659-3381 Substance Abuse/Addiction Treatment  ° °[place] Behavioral Health Resources °Organization         Address  Phone  Notes  °[facility]  (888) 581-9988   °Julie Brannon, PhD [address] [place], [place]   (336) 349-5553 or (336) 951-0000   °[facility]   [address] °[place], [place] (336) 349-4454   °[facility] [address], [place], [place] (336) 342-8316 Insurance/Medicaid/sponsorship through [facility]  °[facility] [address]                                    [place],  (336) 342-8316 Therapy/tele-psych/case    °[facility] [address]  ° [place],  (336) 349-2233    °Dr. Arfeen  (336) 349-4544   °[facility] [place]  [employer] [place] [facility] 1) [address], [place] °2) [address], [place] °3)  [address], [place] (336) 349-3220 °(336) 342-7768 ° °(336) 342-8140   °[place] [facility] (336) 342-1394 or (336) 342-3537 (After Hours)    ° ° °

## 2014-06-27 NOTE — ED Notes (Signed)
Pt from home via EMS- Per EMS, pt tripped this am in the bathroom falling on her back. Pt has hx of chronic back pain. Pt denies hitting her head or LOC. Pt takes pain medication, but lost them on the bus approx 1 month ago. Pt denies neck pain or other injury. Pt is A&O and in NAD

## 2014-06-27 NOTE — ED Notes (Addendum)
Pt request muscle relaxer. Dr Aline Brochure notified

## 2014-08-04 ENCOUNTER — Ambulatory Visit: Payer: Medicaid Other | Attending: Orthopedic Surgery

## 2015-04-15 IMAGING — CR DG LUMBAR SPINE COMPLETE 4+V
5 series · 5 of 5 positions shown · non-contrast
Comparison: None.

CLINICAL DATA: Fall this morning. Pain in right lower back and left
ankle. Remote history of trauma as well.

EXAM:
LUMBAR SPINE - COMPLETE 4+ VIEW

[t lumbar spine ap]
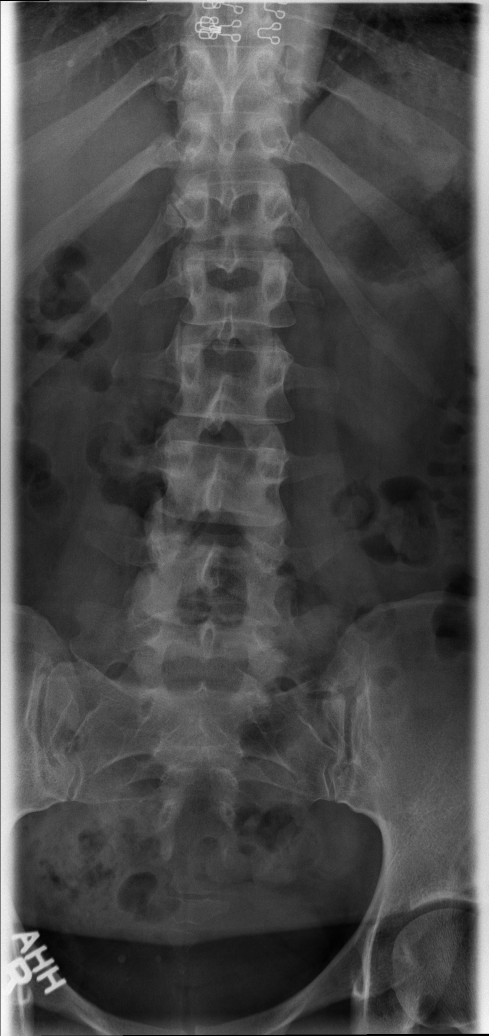

[t lumbar spine obl (1 of 2)]
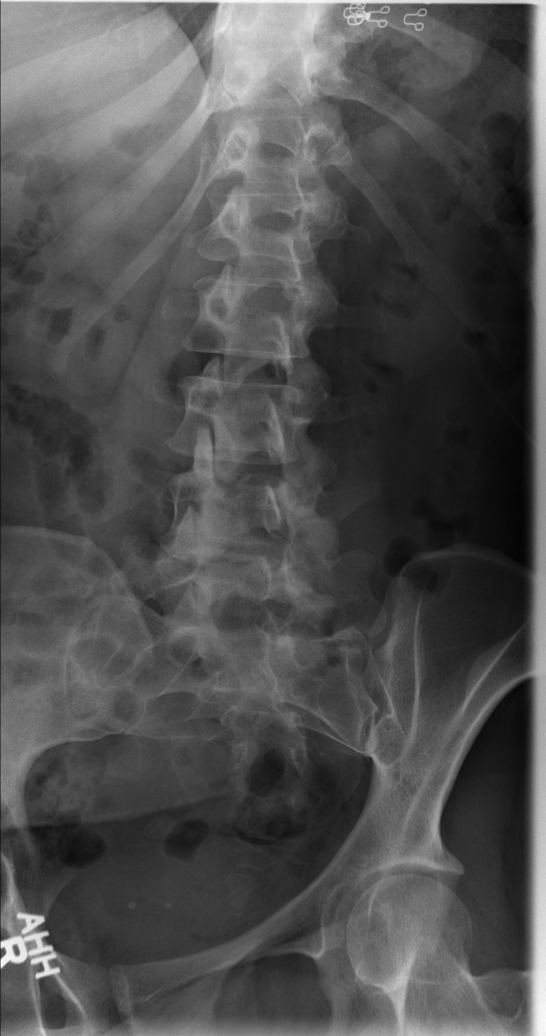

[t lumbar spine obl (2 of 2)]
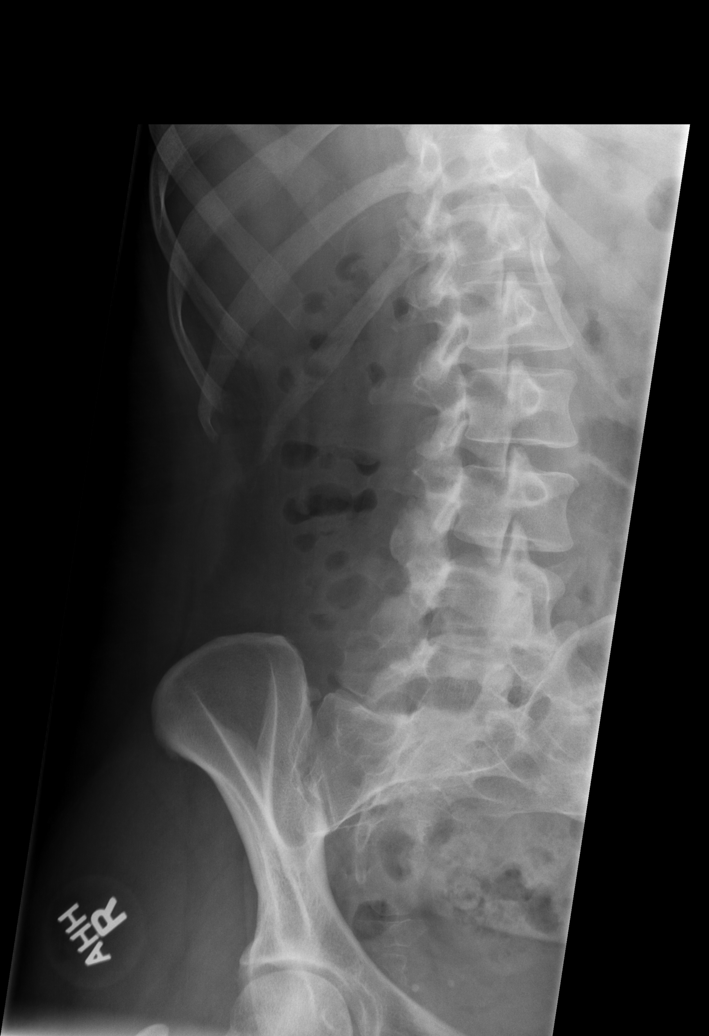

[t lumbar spine lat]
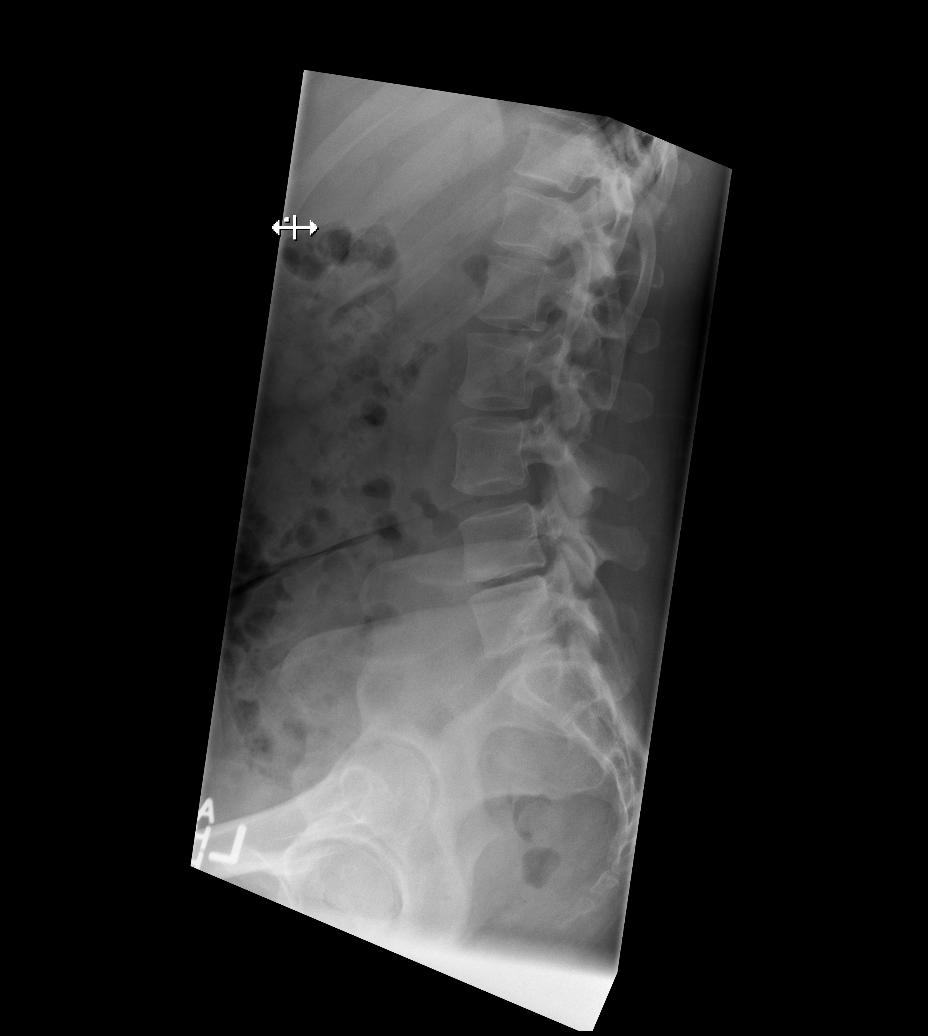

[t lumbar l-5 s-1 spot]
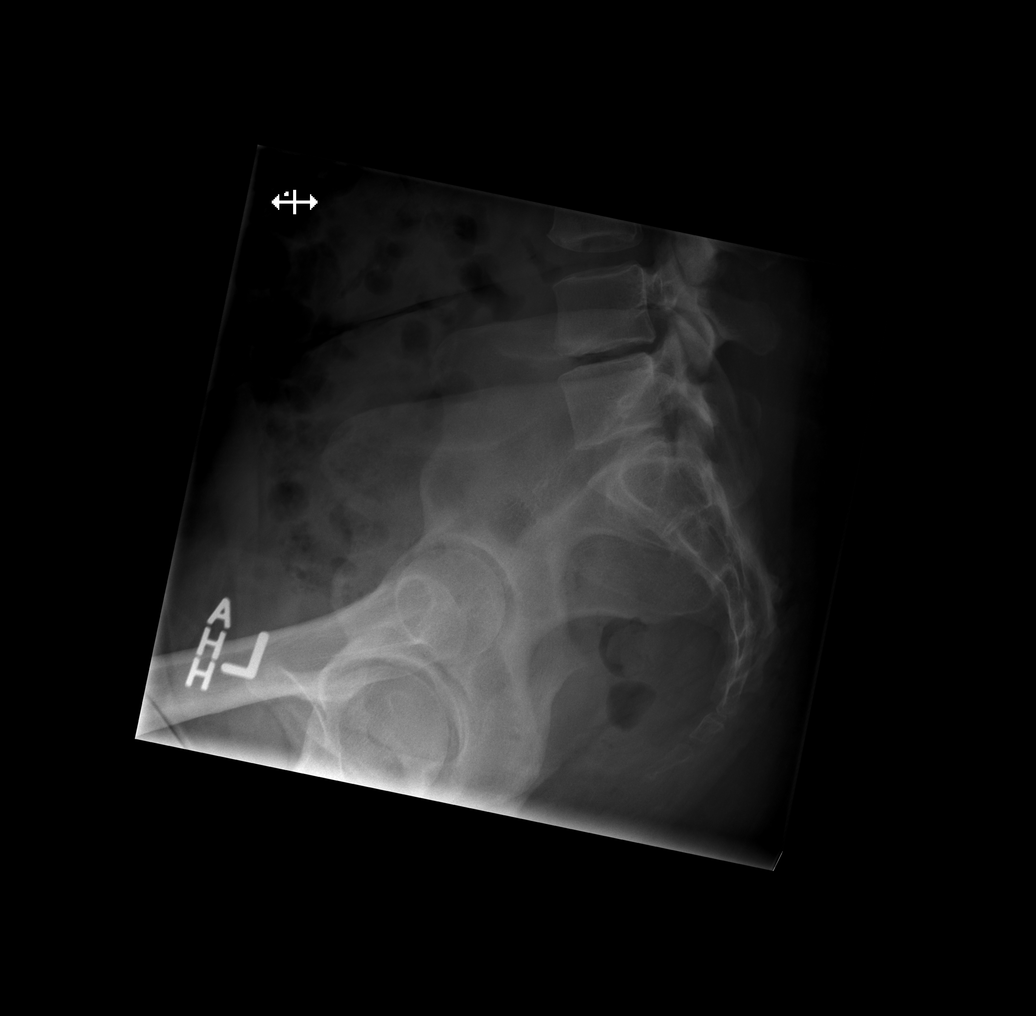

[5 of 5 positions shown; findings below may reference images not displayed]

FINDINGS: Five lumbar type vertebral bodies. Minimal convex left lumbar spine
curvature. Sacroiliac joints are symmetric. Maintenance of vertebral
body height and alignment. Intervertebral disc height is decreased
at L4-5. Mild endplate sclerosis.
IMPRESSION: Degenerative disc disease at L4-5.  No acute osseous abnormality.

## 2015-12-26 ENCOUNTER — Inpatient Hospital Stay: Admit: 2015-12-26 | Payer: MEDICAID | Attending: Acute Care

## 2015-12-26 ENCOUNTER — Encounter

## 2015-12-26 DIAGNOSIS — Z1231 Encounter for screening mammogram for malignant neoplasm of breast: Secondary | ICD-10-CM

## 2016-01-23 ENCOUNTER — Encounter

## 2016-01-23 ENCOUNTER — Inpatient Hospital Stay: Admit: 2016-01-23 | Payer: MEDICAID

## 2016-01-23 DIAGNOSIS — M5136 Other intervertebral disc degeneration, lumbar region: Secondary | ICD-10-CM

## 2016-02-16 ENCOUNTER — Inpatient Hospital Stay: Admit: 2016-02-16 | Payer: MEDICAID

## 2016-02-27 ENCOUNTER — Encounter: Attending: Rehabilitative and Restorative Service Providers"

## 2016-03-05 ENCOUNTER — Encounter: Attending: Rehabilitative and Restorative Service Providers"

## 2016-03-13 ENCOUNTER — Inpatient Hospital Stay
Admit: 2016-03-13 | Discharge: 2016-03-13 | Disposition: A | Discharge status: Home / Self Care | Payer: MEDICAID | Attending: Emergency Medicine

## 2016-03-13 DIAGNOSIS — H6591 Unspecified nonsuppurative otitis media, right ear: Secondary | ICD-10-CM

## 2016-03-13 MED ORDER — AMOXICILLIN CLAVULANATE 875 MG-125 MG TAB
875-125 mg | ORAL_TABLET | Freq: Two times a day (BID) | ORAL | 0 refills | Status: DC
Start: 2016-03-13 — End: 2019-03-12

## 2016-03-13 MED ORDER — TRAMADOL 50 MG TAB
50 mg | ORAL_TABLET | Freq: Four times a day (QID) | ORAL | 0 refills | Status: DC | PRN
Start: 2016-03-13 — End: 2019-03-12

## 2016-03-13 MED ORDER — OXYMETAZOLINE 0.05 % NASAL SPRAY AEROSOL
0.05 % | Freq: Two times a day (BID) | NASAL | 0 refills | Status: DC
Start: 2016-03-13 — End: 2016-03-13

## 2016-03-13 MED ORDER — OXYMETAZOLINE 0.05 % NASAL SPRAY AEROSOL
0.05 % | Freq: Two times a day (BID) | NASAL | 0 refills | Status: AC
Start: 2016-03-13 — End: 2016-03-16

## 2016-03-13 MED ORDER — AMOXICILLIN CLAVULANATE 875 MG-125 MG TAB
875-125 mg | ORAL_TABLET | Freq: Two times a day (BID) | ORAL | 0 refills | Status: DC
Start: 2016-03-13 — End: 2016-03-13

## 2016-03-13 MED ORDER — TRAMADOL 50 MG TAB
50 mg | ORAL_TABLET | Freq: Four times a day (QID) | ORAL | 0 refills | Status: DC | PRN
Start: 2016-03-13 — End: 2016-03-13

## 2016-03-13 NOTE — ED Notes (Signed)
Emergency Department Nursing Plan of Care       The Nursing Plan of Care is developed from the Nursing assessment and Emergency Department Attending provider initial evaluation.  The plan of care may be reviewed in the ???ED Provider note???.    The Plan of Care was developed with the following considerations:   Patient / Family readiness to learn indicated [id number] understanding  Persons(s) to be included in education: patient  Barriers to Learning/Limitations:No    [place]    03/13/2016   1:06 PM

## 2016-03-13 NOTE — ED Provider Notes (Signed)
Patient is a 45 y.o. female presenting with ear pain and cough. The history is provided by the patient.   Ear Pain    This is a new problem. The current episode started 2 days ago. The problem occurs constantly. Patient complains that the right ear is affected.  Patient reports a subjective fever - was not measured.The pain is at a severity of 7/10. Associated symptoms include headaches, rhinorrhea, neck pain and cough. Pertinent negatives include no ear discharge, no hearing loss, no sore throat, no abdominal pain, no diarrhea, no vomiting and no rash. The risk factors include recent URI.  Her past medical history is significant for tympanostomy tube. Her past medical history does not include chronic ear infection or hearing loss.   Cough   Associated symptoms include ear pain, headaches and rhinorrhea. Pertinent negatives include no chills, no eye redness, no sore throat, no wheezing and no vomiting.          Past Medical History:   Diagnosis Date   ??? ADHD    ??? Asthma    ??? Depression    ??? Fibromyalgia    ??? Gastroenteritis    ??? Hydradenitis     suppurativa   ??? Osteoarthritis    ??? Other ill-defined conditions     chronic back pain   ??? Psychiatric disorder        Past Surgical History:   Procedure Laterality Date   ??? HX GYN      tubes tided         Family History:   Problem Relation Age of Onset   ??? Alcohol abuse Neg Hx    ??? Arthritis-rheumatoid Neg Hx    ??? Asthma Neg Hx    ??? Bleeding Prob Neg Hx    ??? Cancer Neg Hx    ??? Diabetes Neg Hx    ??? Elevated Lipids Neg Hx    ??? Headache Neg Hx    ??? Heart Disease Neg Hx    ??? Hypertension Neg Hx    ??? Lung Disease Neg Hx    ??? Migraines Neg Hx    ??? Psychiatric Disorder Neg Hx    ??? Stroke Neg Hx    ??? Mental Retardation Neg Hx        Social History     Social History   ??? Marital status: SINGLE     Spouse name: N/A   ??? Number of children: N/A   ??? Years of education: N/A     Occupational History   ??? Not on file.     Social History Main Topics   ??? Smoking status: Never Smoker    ??? Smokeless tobacco: Not on file   ??? Alcohol use No   ??? Drug use: No   ??? Sexual activity: Not on file     Other Topics Concern   ??? Not on file     Social History Narrative    ** Merged History Encounter **              ALLERGIES: Aspirin; Aspirin; Ibuprofen; and Ibuprofen    Review of Systems   Constitutional: Negative for activity change, chills and fever.   HENT: Positive for ear pain and rhinorrhea. Negative for ear discharge, hearing loss and sore throat.    Eyes: Negative for pain, discharge and redness.   Respiratory: Positive for cough and choking. Negative for chest tightness and wheezing.    Gastrointestinal: Negative for abdominal pain, diarrhea and vomiting.  Genitourinary: Negative for dysuria and hematuria.   Musculoskeletal: Positive for neck pain. Negative for back pain and joint swelling.   Skin: Negative for rash.   Neurological: Positive for headaches.       Vitals:    03/13/16 1213   BP: 140/85   Resp: 16   Temp: 98.5 ??F (36.9 ??C)   SpO2: 96%   Weight: 85.7 kg (189 lb)   Height: 5\' 3"  (1.6 m)            Physical Exam   Constitutional: She is oriented to person, place, and time. She appears well-developed and well-nourished.   HENT:   Head: Normocephalic.   Left Ear: External ear normal.   Mouth/Throat: Oropharynx is clear and moist.   Nasally congested.   Tenderness over R maxiallary sinus.   No facial or orbital swelling.   R TM erythematous. Canal normal. No mastoid tenderness.    Eyes: Conjunctivae are normal. Pupils are equal, round, and reactive to light.   Neck: Normal range of motion. No thyromegaly present.   Cardiovascular: Normal rate.    No murmur heard.  Pulmonary/Chest: Effort normal. No respiratory distress. She has no wheezes. She has no rales.   Abdominal: Soft. There is no tenderness. There is no rebound.   Musculoskeletal: Normal range of motion.   Lymphadenopathy:     She has no cervical adenopathy.   Neurological: She is alert and oriented to person, place, and time.    Skin: Skin is warm and dry.   Psychiatric: She has a normal mood and affect. Her behavior is normal.   Nursing note and vitals reviewed.       MDM  Number of Diagnoses or Management Options  Right non-suppurative otitis media:   Diagnosis management comments: DDX: URI, bronichitis, OM    ED Course       Procedures      LABORATORY TESTS:  No results found for this or any previous visit (from the past 12 hour(s)).    IMAGING RESULTS:  No orders to display       MEDICATIONS GIVEN:  Medications - No data to display    IMPRESSION:  1. Right non-suppurative otitis media        PLAN:  1.   Current Discharge Medication List      START taking these medications    Details   oxymetazoline (AFRIN, OXYMETAZOLINE,) 0.05 % nasal spray 2 Sprays by Both Nostrils route two (2) times a day for 3 days.  Qty: 1 Each, Refills: 0      amoxicillin-clavulanate (AUGMENTIN) 875-125 mg per tablet Take 1 Tab by mouth two (2) times a day.  Qty: 14 Tab, Refills: 0      traMADol (ULTRAM) 50 mg tablet Take 1 Tab by mouth every six (6) hours as needed for Pain. Max Daily Amount: 200 mg.  Qty: 15 Tab, Refills: 0         STOP taking these medications       oxyCODONE-acetaminophen (PERCOCET) 5-325 mg per tablet Comments:   Reason for Stopping:         oxyCODONE-acetaminophen (PERCOCET) 5-325 mg per tablet Comments:   Reason for Stopping:         morphine IR (MS IR) 30 mg tablet Comments:   Reason for Stopping:             2.   Follow-up Information     Follow up With Details Comments Contact Info    Aniceto Boss  Bobette Mo, MD  As needed [place] [address]  [phone]      [facility] EMERGENCY DEPT  If symptoms worsen [address] [place] [address]  [phone]        Return to ED if worse

## 2016-03-13 NOTE — ED Notes (Signed)
Patient given printed discharge instructions and three script(s).  Pt verbalized understanding of instructions and script(s).  Patient verbalized importance of following up with pcp.  Patient alert and oriented, in no acute distress, ambulatory with self.

## 2016-08-24 ENCOUNTER — Encounter: Attending: Rehabilitative and Restorative Service Providers"

## 2018-09-03 ENCOUNTER — Encounter

## 2019-03-12 ENCOUNTER — Emergency Department: Admit: 2019-03-12 | Payer: PRIVATE HEALTH INSURANCE

## 2019-03-12 ENCOUNTER — Inpatient Hospital Stay: Admit: 2019-03-13 | Payer: PRIVATE HEALTH INSURANCE

## 2019-03-12 ENCOUNTER — Inpatient Hospital Stay
Admit: 2019-03-12 | Discharge: 2019-03-15 | Disposition: A | Discharge status: Home Health | Payer: PRIVATE HEALTH INSURANCE | Attending: Family Medicine | Admitting: Family Medicine

## 2019-03-12 DIAGNOSIS — R531 Weakness: Secondary | ICD-10-CM

## 2019-03-12 LAB — CBC WITH AUTO DIFFERENTIAL
Basophils %: 1 % (ref 0–1)
Basophils Absolute: 0 10*3/uL (ref 0.0–0.1)
Eosinophils %: 3 % (ref 0–7)
Eosinophils Absolute: 0.2 10*3/uL (ref 0.0–0.4)
Granulocyte Absolute Count: 0 10*3/uL (ref 0.00–0.04)
Hematocrit: 30.5 % — ABNORMAL LOW (ref 35.0–47.0)
Hemoglobin: 9.6 g/dL — ABNORMAL LOW (ref 11.5–16.0)
Immature Granulocytes: 0 % (ref 0.0–0.5)
Lymphocytes %: 46 % (ref 12–49)
Lymphocytes Absolute: 2.6 10*3/uL (ref 0.8–3.5)
MCH: 28.2 PG (ref 26.0–34.0)
MCHC: 31.5 g/dL (ref 30.0–36.5)
MCV: 89.7 FL (ref 80.0–99.0)
MPV: 11.3 FL (ref 8.9–12.9)
Monocytes %: 6 % (ref 5–13)
Monocytes Absolute: 0.4 10*3/uL (ref 0.0–1.0)
NRBC Absolute: 0 10*3/uL (ref 0.00–0.01)
Neutrophils %: 44 % (ref 32–75)
Neutrophils Absolute: 2.5 10*3/uL (ref 1.8–8.0)
Nucleated RBCs: 0 PER 100 WBC
Platelets: 247 10*3/uL (ref 150–400)
RBC: 3.4 M/uL — ABNORMAL LOW (ref 3.80–5.20)
RDW: 14.6 % — ABNORMAL HIGH (ref 11.5–14.5)
WBC: 5.6 10*3/uL (ref 3.6–11.0)

## 2019-03-12 LAB — COMPREHENSIVE METABOLIC PANEL
ALT: 20 U/L (ref 12–78)
AST: 16 U/L (ref 15–37)
Albumin/Globulin Ratio: 0.8 — ABNORMAL LOW (ref 1.1–2.2)
Albumin: 3.4 g/dL — ABNORMAL LOW (ref 3.5–5.0)
Alkaline Phosphatase: 52 U/L (ref 45–117)
Anion Gap: 5 mmol/L (ref 5–15)
BUN: 14 MG/DL (ref 6–20)
Bun/Cre Ratio: 14 (ref 12–20)
CO2: 26 mmol/L (ref 21–32)
Calcium: 8.7 MG/DL (ref 8.5–10.1)
Chloride: 105 mmol/L (ref 97–108)
Creatinine: 1.01 MG/DL (ref 0.55–1.02)
EGFR IF NonAfrican American: 59 mL/min/{1.73_m2} — ABNORMAL LOW (ref >60)
GFR African American: >60 mL/min/{1.73_m2} (ref >60)
Globulin: 4.1 g/dL — ABNORMAL HIGH (ref 2.0–4.0)
Glucose: 82 mg/dL (ref 65–100)
Potassium: 3.4 mmol/L — ABNORMAL LOW (ref 3.5–5.1)
Sodium: 136 mmol/L (ref 136–145)
Total Bilirubin: 0.5 MG/DL (ref 0.2–1.0)
Total Protein: 7.5 g/dL (ref 6.4–8.2)

## 2019-03-12 LAB — MAGNESIUM
Magnesium: 2.1 mg/dL (ref 1.6–2.4)
Magnesium: 2.1 mg/dL (ref 1.6–2.4)

## 2019-03-12 LAB — CBC WITH AUTOMATED DIFF
ABS. BASOPHILS: 0 10*3/uL (ref 0.0–0.1)
ABS. EOSINOPHILS: 0.2 10*3/uL (ref 0.0–0.4)
ABS. IMM. GRANS.: 0 10*3/uL (ref 0.00–0.04)
ABS. LYMPHOCYTES: 2.6 10*3/uL (ref 0.8–3.5)
ABS. MONOCYTES: 0.4 10*3/uL (ref 0.0–1.0)
ABS. NEUTROPHILS: 2.5 10*3/uL (ref 1.8–8.0)
ABSOLUTE NRBC: 0 10*3/uL (ref 0.00–0.01)
BASOPHILS: 1 % (ref 0–1)
EOSINOPHILS: 3 % (ref 0–7)
HCT: 30.5 % — ABNORMAL LOW (ref 35.0–47.0)
HGB: 9.6 g/dL — ABNORMAL LOW (ref 11.5–16.0)
IMMATURE GRANULOCYTES: 0 % (ref 0.0–0.5)
LYMPHOCYTES: 46 % (ref 12–49)
MCH: 28.2 PG (ref 26.0–34.0)
MCHC: 31.5 g/dL (ref 30.0–36.5)
MCV: 89.7 FL (ref 80.0–99.0)
MONOCYTES: 6 % (ref 5–13)
MPV: 11.3 FL (ref 8.9–12.9)
NEUTROPHILS: 44 % (ref 32–75)
NRBC: 0 PER 100 WBC
PLATELET: 247 10*3/uL (ref 150–400)
RBC: 3.4 M/uL — ABNORMAL LOW (ref 3.80–5.20)
RDW: 14.6 % — ABNORMAL HIGH (ref 11.5–14.5)
WBC: 5.6 10*3/uL (ref 3.6–11.0)

## 2019-03-12 LAB — METABOLIC PANEL, COMPREHENSIVE
A-G Ratio: 0.8 — ABNORMAL LOW (ref 1.1–2.2)
ALT (SGPT): 20 U/L (ref 12–78)
AST (SGOT): 16 U/L (ref 15–37)
Albumin: 3.4 g/dL — ABNORMAL LOW (ref 3.5–5.0)
Alk. phosphatase: 52 U/L (ref 45–117)
Anion gap: 5 mmol/L (ref 5–15)
BUN/Creatinine ratio: 14 (ref 12–20)
BUN: 14 MG/DL (ref 6–20)
Bilirubin, total: 0.5 MG/DL (ref 0.2–1.0)
CO2: 26 mmol/L (ref 21–32)
Calcium: 8.7 MG/DL (ref 8.5–10.1)
Chloride: 105 mmol/L (ref 97–108)
Creatinine: 1.01 MG/DL (ref 0.55–1.02)
GFR est AA: >60 mL/min/{1.73_m2} (ref >60)
GFR est non-AA: 59 mL/min/{1.73_m2} — ABNORMAL LOW (ref >60)
Globulin: 4.1 g/dL — ABNORMAL HIGH (ref 2.0–4.0)
Glucose: 82 mg/dL (ref 65–100)
Potassium: 3.4 mmol/L — ABNORMAL LOW (ref 3.5–5.1)
Protein, total: 7.5 g/dL (ref 6.4–8.2)
Sodium: 136 mmol/L (ref 136–145)

## 2019-03-12 LAB — SAMPLES BEING HELD: SAMPLES BEING HELD: 1

## 2019-03-12 MED ORDER — SODIUM CHLORIDE 0.9 % IJ SYRG
Freq: Three times a day (TID) | INTRAMUSCULAR | Status: DC
Start: 2019-03-12 — End: 2019-03-15
  Administered 2019-03-13 – 2019-03-15 (×7): via INTRAVENOUS

## 2019-03-12 MED ORDER — METHYLPREDNISOLONE SODIUM SUCC 1,000 MG IJ SOLR
1000 mg | INTRAVENOUS | Status: AC
Start: 2019-03-12 — End: 2019-03-12
  Administered 2019-03-13: 02:00:00 via INTRAVENOUS

## 2019-03-12 MED ORDER — SODIUM CHLORIDE 0.9 % IJ SYRG
INTRAMUSCULAR | Status: DC | PRN
Start: 2019-03-12 — End: 2019-03-15

## 2019-03-12 MED FILL — MONOJECT PREFILL ADVANCED 0.9 % SODIUM CHLORIDE INJECTION SYRINGE: INTRAMUSCULAR | Qty: 40

## 2019-03-12 NOTE — ED Notes (Signed)
2030; pt to MRI then unit

## 2019-03-12 NOTE — Other (Signed)
TRANSFER - OUT REPORT:    Verbal report given to Emily,RN(name) on Anita Bell  being transferred to NSTU(unit) for routine progression of care       Report consisted of patient???s Situation, Background, Assessment and   Recommendations(SBAR).     Information from the following report(s) SBAR and ED Summary was reviewed with the receiving nurse.    Lines:   Peripheral IV 03/12/19 Right Antecubital (Active)   Site Assessment Clean, dry, & intact 03/12/2019  7:05 PM   Phlebitis Assessment 0 03/12/2019  7:05 PM   Infiltration Assessment 0 03/12/2019  7:05 PM        Opportunity for questions and clarification was provided.  Pt to MRI tehn floor    Patient transported with:  tech

## 2019-03-12 NOTE — ED Provider Notes (Addendum)
HPI       68y F with hx of MS (diagnosed a few years ago on MRI but never saw neurology) here with worsening RLE weakness. Has been a problem the past few months but worse in the past few days. Also with slurred speech off and on the past day or two. Had an episode of slurred speech at 11am today that resolved. Is supposed to be seeing neurology in the near future and called to let them know what was going on. Told to go to the ED and would need IV steroids. No recent illnesses. No fever. No vomiting/diarrhea. No urinary sx's.    Past Medical History:   Diagnosis Date   ??? ADHD    ??? Asthma    ??? Depression    ??? Fibromyalgia    ??? Gastroenteritis    ??? Hydradenitis     suppurativa   ??? Osteoarthritis    ??? Other ill-defined conditions(799.89)     chronic back pain   ??? Psychiatric disorder        Past Surgical History:   Procedure Laterality Date   ??? HX GYN      tubes tided         Family History:   Problem Relation Age of Onset   ??? Alcohol abuse Neg Hx    ??? Arthritis-rheumatoid Neg Hx    ??? Asthma Neg Hx    ??? Bleeding Prob Neg Hx    ??? Cancer Neg Hx    ??? Diabetes Neg Hx    ??? Elevated Lipids Neg Hx    ??? Headache Neg Hx    ??? Heart Disease Neg Hx    ??? Hypertension Neg Hx    ??? Lung Disease Neg Hx    ??? Migraines Neg Hx    ??? Psychiatric Disorder Neg Hx    ??? Stroke Neg Hx    ??? Mental Retardation Neg Hx        Social History     Socioeconomic History   ??? Marital status: SINGLE     Spouse name: Not on file   ??? Number of children: Not on file   ??? Years of education: Not on file   ??? Highest education level: Not on file   Occupational History   ??? Not on file   Social Needs   ??? Financial resource strain: Not on file   ??? Food insecurity     Worry: Not on file     Inability: Not on file   ??? Transportation needs     Medical: Not on file     Non-medical: Not on file   Tobacco Use   ??? Smoking status: Never Smoker   ??? Smokeless tobacco: Never Used   Substance and Sexual Activity   ??? Alcohol use: No   ??? Drug use: No      Types: Prescription, Opiates   ??? Sexual activity: Not on file   Lifestyle   ??? Physical activity     Days per week: Not on file     Minutes per session: Not on file   ??? Stress: Not on file   Relationships   ??? Social Product manager on phone: Not on file     Gets together: Not on file     Attends religious service: Not on file     Active member of club or organization: Not on file     Attends meetings of clubs or organizations: Not  on file     Relationship status: Not on file   ??? Intimate partner violence     Fear of current or ex partner: Not on file     Emotionally abused: Not on file     Physically abused: Not on file     Forced sexual activity: Not on file   Other Topics Concern   ??? Not on file   Social History Narrative    ** Merged History Encounter **              ALLERGIES: Aspirin; Aspirin; Ibuprofen; and Ibuprofen    Review of Systems   Review of Systems   Constitutional: (-) weight loss.   HEENT: (-) stiff neck   Eyes: (-) discharge.   Respiratory: (-) cough.    Cardiovascular: (-) syncope.   Gastrointestinal: (-) blood in stool.   Genitourinary: (-) hematuria.  Musculoskeletal: (-) myalgias.   Neurological: (-) seizure.   Skin: (-) petechiae  Lymph/Immunologic: (-) enlarged lymph nodes  All other systems reviewed and are negative.        Vitals:    03/12/19 1417   BP: (!) 147/91   Pulse: 72   Resp: 18   Temp: 99 ??F (37.2 ??C)   SpO2: 100%            Physical Exam Nursing note and vitals reviewed.  Constitutional: oriented to person, place, and time. appears well-developed and well-nourished. No distress.  Head: Normocephalic and atraumatic. Sclera anicteric  Nose: No rhinorrhea  Mouth/Throat: Oropharynx is clear and moist. Pharynx normal  Eyes: Conjunctivae are normal. Pupils are equal, round, and reactive to light. Right eye exhibits no discharge. Left eye exhibits no discharge.  Neck: Painless normal range of motion. Neck supple. No LAD.   Cardiovascular: Normal rate, regular rhythm, normal heart sounds and intact distal pulses.  Exam reveals no gallop and no friction rub.  No murmur heard.  Pulmonary/Chest:  No respiratory distress. No wheezes. No rales. No rhonchi. No increased work of breathing. No accessory muscle use. Good air exchange throughout.  Abdominal: soft, non-tender, no rebound or guarding. No hepatosplenomegaly. Normal bowel sounds throughout.  Back: no tenderness to palpation, no deformities, no CVA tenderness  Extremities/Musculoskeletal: Normal range of motion. no tenderness. No edema. Distal extremities are neurovasc intact.  Lymphadenopathy:   No adenopathy.   Neurological:  CN II-XII intact, 4/5 strength in RUE and RLE, 5/5 elsewhere, decreased sensation to touch in RLE but normal elsewhere, nl cerebellar function, nl speech/fluency, nl gait/station, no pronator drift. Normal mental status.  Skin: Skin is warm and dry. No rash noted. No pallor.        MDM 67y F here with worsening RLE weakness and slurred speech. Sounds subacute. Hx of MS. Will check labs and CT. May need admit for further workup and management.         Procedures      Hospitalist Perfect Serve for Admission  6:09 PM    ED Room Number: ER10/10  Patient Name and age:  Anita Bell 48 y.o.  female  Working Diagnosis: No diagnosis found.    COVID-19 Suspicion:  no    Code Status:  Full Code  Readmission: no  Isolation Requirements:  no  Recommended Level of Care:  med/surg  Department:[facility] Adult ED - [phone]  Other:  23y F with hx of MS here with worsening RLE weakness. Ongoing for a few months but worse in the past few days - hard to ambulate.  Also having slurred speech yesterday and today (although improving on arrival to the ED). She called neuro who said she needed to come in for IV steroids.

## 2019-03-12 NOTE — Consults (Signed)
[place]    Name:  ILO, BEAMON  MR#:  [id number]  DOB:  08-03-1971  ACCOUNT #:  [id number]  DATE OF SERVICE:  03/14/2019    NEUROLOGY CONSULTATION    HISTORY OF PRESENT ILLNESS:  A 48 right-handed female, who was admitted on 03/12/19 with complaints of right lower extremity weakness for several months, but worse in the previous 2 days, and slurred speech on the day of presentation.  The patient reports a history of multiple sclerosis, but it is not clear how this diagnosis was made and the patient has never followed up with a neurologist.  She reportedly has an upcoming appointment with a neurologist at [facility], called there on the day of admission, and they suggested she go to the emergency department.  The patient reports that she has chronic right lower extremity weakness, but it was worse in the last 4 months, and she has had right arm pain, numbness, and tingling for the last 2 months.  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  The patient also notes she has a history of "a spinal cord injury" after a fall while running through a field, landing on her back.  The patient has chronic low back pain not radiating to her legs since this fall in 05/2003, and is on Suboxone, Cymbalta, gabapentin, Remeron, and risperidone.  Her MRI of the brain wo contrast is already completed and is reviewed in PACS, there are no white matter lesions, this is a completely normal MRI of the brain.  MRI of the cervical and lumbar spine have been ordered and are pending.  Her lab evaluation included an ESR of 31, a B12 of 316.  A UA that is normal.  Metabolic panel with a slightly low potassium of 3.4, magnesium normal.  CBC with a hemoglobin of 9.6, otherwise unremarkable.    PAST MEDICAL HISTORY:  ADHD, asthma, fibromyalgia, depression, hidradenitis suppurativa,  osteoarthritis, chronic low back pain after a fall in 2004, bilateral tubal ligation.    REVIEW OF SYSTEMS:  As per past medical history and HPI, otherwise reviewed and negative.    MEDICATIONS AT HOME:  Suboxone, Cymbalta, Pepcid, gabapentin, Remeron, naproxen, risperidone.  She has been started on tramadol here.    ALLERGIES:  ASPIRIN AND IBUPROFEN.    SOCIAL HISTORY:  She lives in [place].  She is not working.  She is trying to get disability for her multiple sclerosis and spinal cord injury.  She denies any alcohol or tobacco use.  She previously used recreational drugs, when asked when the last time was, she stated, "let's just say its still a challenge."    FAMILY HISTORY:  No neurological diseases she is aware of, except a grandmother with stroke.    PHYSICAL EXAMINATION:  VITAL SIGNS:  Blood pressure is 117/66, pulse 74, respiratory rate 14, satting 100% on room air, temperature is 97.8.  BMI of 28.2.  GENERAL:  She is a well-nourished, well-developed, healthy-appearing female, sitting on the side of the bed, initially in no distress.  HEART:  Regular rate and rhythm without murmurs, gallops, or rubs.  Her carotids are 2+.  No bruits.  EXTREMITIES:  Warm, without clubbing, cyanosis, or edema.  NEUROLOGIC/MENTAL STATUS:  Alert, oriented x4.  Speech, language intact.  Attention, memory, and fund of knowledge appropriate.  Cranial nerve exam shows no  facial asymmetry or ptosis.  Her extraocular eye movements were intact without diplopia or nystagmus.  Her visual fields were full.  Pupils equally round and reactive.  Her tongue is midline and palate elevates symmetrically.  Strength, sensation, and hearing intact.  Trapezius 5/5 bilaterally.  With sternocleidomastoid testing, with her head turned to the right, she had giveaway weakness.  With her head to the left, she had 5/5 strength.  Her motor examination, the patient had give-way weakness with right upper extremity testing, with encouragement, manifested  5/5 strength throughout, did not have any pronator drift.  No tremor.  Lower extremities with positive Hoover's on the right.  Sensory exam: reported decreased to pinprick in the right arm, intact in the leg.  Reflexes were symmetric, toes were downgoing.  Coordination was intact to finger-to-nose, heel-to-shin, RAM. Gait: Not assessed.    STUDIES AND REPORTS:  Reviewed above in the HPI.    ASSESSMENT AND PLAN:  This is a 48 year old right-handed female with reported history of multiple sclerosis and spinal cord injury, but who has a normal MRI of the brain without a single white matter lesion and no evidence of myelopathy on exam to suggest spinal cord injury, with non-physiological findings on exam making accurate evaluation of her strength difficult.  I agree with an MRI of the cervical spine and L-spine to completely exclude demyelinating disease or other structural etiology for her complaints.  If these are unremarkable, she would be stable for discharge from a neurologic standpoint.      Chauncey Reading, MD      [id number]  D:  03/14/2019 21:56  T:  03/15/2019 2:47  JOB #:  [id number]

## 2019-03-12 NOTE — H&P (Signed)
H&P by Revonda Standard,  MD at 03/12/19 1931                Author: Revonda Standard, MD  Service: Internal Medicine  Author Type: Physician       Filed: 03/12/19 2035  Date of Service: 03/12/19 1931  Status: Signed          Editor: Revonda Standard, MD (Physician)                    [facility]   History and Physical      Primary Care Provider: None   Date of Service:  03/12/2019        Subjective:        Anita Bell is a 48 y.o.  female with h/o chronic back pain, ADHD, depression and MS diagnosed 3 years ago presents to the ED complaining of right lower extremity weakness for several months but worse in the last 2 days. She  also report slurred speech this morning but now resolved. She called her neurologist who recommended patient to come to the emergency room for evaluation and possible IV steroids as per her report. Patient has never followed with a neurologist but was  scheduled to see one.  She denies headache, visual disturbance, decreased sensation, SOB, chest pain, nausea, vomiting, abdominal pain or urinary symptoms.  Initial diagnosis of MS with some when she started experiencing lower extremity weakness.  She  states that she had MRI that showed MS (no record available).  Patient also has history of chronic back pain due to mechanical fall on Suboxone.  Patient has been experiencing worsening lower back pain bilaterally.  Pain does not radiate.  She denies  any decreased sensation of the lower extremities.  Denies urinary retention or fecal incontinence.  No saddle paresthesia.  No recent trauma or injury.         Review of Systems:      Review of Systems    Constitutional: Negative for chills, fever, malaise/fatigue and weight loss.    HENT: Negative for congestion, ear discharge and hearing loss.     Eyes: Positive for blurred vision. Negative for double vision, photophobia, pain, discharge and redness.    Respiratory: Negative for cough, sputum production,  shortness of breath and wheezing.     Cardiovascular: Negative for chest pain, palpitations, orthopnea, leg swelling and PND.    Gastrointestinal: Negative for abdominal pain, constipation, diarrhea, melena, nausea and vomiting.    Genitourinary: Negative for dysuria, frequency and urgency.    Musculoskeletal: Negative for back pain, joint pain and myalgias.    Skin: Negative for itching and rash.    Neurological: Positive for focal weakness. Negative for dizziness, sensory change, speech change, weakness and headaches.    Endo/Heme/Allergies: Negative for polydipsia. Does not bruise/bleed easily.            Past Medical History:        Diagnosis  Date         ?  ADHD       ?  Asthma       ?  Depression       ?  Fibromyalgia       ?  Gastroenteritis       ?  Hydradenitis            suppurativa         ?  Osteoarthritis       ?  Other ill-defined conditions(799.89)            chronic back pain         ?  Psychiatric disorder             Past Surgical History:         Procedure  Laterality  Date          ?  HX GYN              tubes tided          Prior to Admission medications             Medication  Sig  Start Date  End Date  Taking?  Authorizing Provider            buprenorphine-naloxone (SUBOXONE) 8-2 mg film sublingaul film  1 Film by SubLINGual route three (3) times daily.      Yes  Provider, Historical     DULoxetine (CYMBALTA) 60 mg capsule  Take 60 mg by mouth daily.      Yes  Provider, Historical     famotidine (PEPCID) 20 mg tablet  Take 20 mg by mouth two (2) times a day.      Yes  Provider, Historical     gabapentin (NEURONTIN) 800 mg tablet  Take 800 mg by mouth three (3) times daily.      Yes  Provider, Historical     mirtazapine (REMERON SOL-TAB) 15 mg disintegrating tablet  Take 15 mg by mouth nightly.      Yes  Provider, Historical     naproxen (NAPROSYN) 375 mg tablet  Take 375 mg by mouth two (2) times daily (with meals).      Yes  Provider, Historical            risperiDONE (RisperDAL) 1 mg tablet   Take 1 mg by mouth daily.      Yes  Provider, Historical          Allergies        Allergen  Reactions         ?  Aspirin  Nausea and Vomiting     ?  Aspirin  Hives     ?  Ibuprofen  Other (comments)             Upsets her stomach         ?  Ibuprofen  Other (comments)             Abdominal pain              Family History         Problem  Relation  Age of Onset          ?  Alcohol abuse  Neg Hx       ?  Arthritis-rheumatoid  Neg Hx       ?  Asthma  Neg Hx       ?  Bleeding Prob  Neg Hx       ?  Cancer  Neg Hx       ?  Diabetes  Neg Hx       ?  Elevated Lipids  Neg Hx       ?  Headache  Neg Hx       ?  Heart Disease  Neg Hx       ?  Hypertension  Neg Hx       ?  Lung Disease  Neg Hx       ?  Migraines  Neg Hx       ?  Psychiatric Disorder  Neg Hx       ?  Stroke  Neg Hx            ?  Mental Retardation  Neg Hx              SOCIAL HISTORY:   Patient resides at    Patient ambulates with cane   Smoking history: Never   Alcohol history: none              Objective:           Physical Exam:    Visit Vitals      BP  (!) 147/91 (BP 1 Location: Right arm, BP Patient Position: At rest)     Pulse  72     Temp  99 ??F (37.2 ??C)     Resp  18        SpO2  100%        GEN APPEARANCE: Patient resting in bed in NAD   HEENT: Conjunctiva Clear   CVS: RRR, S1, S2; No M/G/R   LUNGS: CTAB; No Wheezes; No Rhonchi: No rales   ABD: Soft; No TTP; + Normoactive BS   EXT: no edema    Skin exam: No gross lesions noted on exposed skin surfaces   MENTAL STATUS: Answers questions appropriately, responds to commands.    Neuro: Cranial nerve exam: EOMI, CN V sensory/motor intact, no facial asymmetry noted, patient able to hearl, uvula midline, able to move tongue left/right. motor strength 3 out of 5 right lower extremity and 4 out of 5 left lower extremity.  Upper extremity  normal motor strength.  Sensation is intact. Movement of the LE limited by lower back pain   Back: Normal curvature.  Tender to palpation at the level of L4-L5, paraspinal  muscle tenderness and bilateral flank pain.  Straight leg test positive bilaterally.      Data Review:      Recent Results (from the past 24 hour(s))     CBC WITH AUTOMATED DIFF          Collection Time: 03/12/19  3:41 PM         Result  Value  Ref Range            WBC  5.6  3.6 - 11.0 K/uL       RBC  3.40 (L)  3.80 - 5.20 M/uL       HGB  9.6 (L)  11.5 - 16.0 g/dL       HCT  30.5 (L)  35.0 - 47.0 %       MCV  89.7  80.0 - 99.0 FL       MCH  28.2  26.0 - 34.0 PG       MCHC  31.5  30.0 - 36.5 g/dL       RDW  14.6 (H)  11.5 - 14.5 %       PLATELET  247  150 - 400 K/uL       MPV  11.3  8.9 - 12.9 FL       NRBC  0.0  0 PER 100 WBC       ABSOLUTE NRBC  0.00  0.00 - 0.01 K/uL       NEUTROPHILS  44  32 - 75 %       LYMPHOCYTES  46  12 - 49 %  MONOCYTES  6  5 - 13 %       EOSINOPHILS  3  0 - 7 %       BASOPHILS  1  0 - 1 %       IMMATURE GRANULOCYTES  0  0.0 - 0.5 %       ABS. NEUTROPHILS  2.5  1.8 - 8.0 K/UL       ABS. LYMPHOCYTES  2.6  0.8 - 3.5 K/UL       ABS. MONOCYTES  0.4  0.0 - 1.0 K/UL       ABS. EOSINOPHILS  0.2  0.0 - 0.4 K/UL       ABS. BASOPHILS  0.0  0.0 - 0.1 K/UL       ABS. IMM. GRANS.  0.0  0.00 - 0.04 K/UL       DF  AUTOMATED          METABOLIC PANEL, COMPREHENSIVE          Collection Time: 03/12/19  3:41 PM         Result  Value  Ref Range            Sodium  136  136 - 145 mmol/L       Potassium  3.4 (L)  3.5 - 5.1 mmol/L       Chloride  105  97 - 108 mmol/L       CO2  26  21 - 32 mmol/L       Anion gap  5  5 - 15 mmol/L       Glucose  82  65 - 100 mg/dL       BUN  14  6 - 20 MG/DL       Creatinine  1.01  0.55 - 1.02 MG/DL       BUN/Creatinine ratio  14  12 - 20         GFR est AA  >60  >60 ml/min/1.20m       GFR est non-AA  59 (L)  >60 ml/min/1.764m      Calcium  8.7  8.5 - 10.1 MG/DL       Bilirubin, total  0.5  0.2 - 1.0 MG/DL       ALT (SGPT)  20  12 - 78 U/L       AST (SGOT)  16  15 - 37 U/L       Alk. phosphatase  52  45 - 117 U/L       Protein, total  7.5  6.4 - 8.2 g/dL       Albumin  3.4 (L)   3.5 - 5.0 g/dL       Globulin  4.1 (H)  2.0 - 4.0 g/dL       A-G Ratio  0.8 (L)  1.1 - 2.2         MAGNESIUM          Collection Time: 03/12/19  3:41 PM         Result  Value  Ref Range            Magnesium  2.1  1.6 - 2.4 mg/dL       SAMPLES BEING HELD          Collection Time: 03/12/19  3:41 PM         Result  Value  Ref Range            SAMPLES BEING HELD  1 red, 1 blue          COMMENT                  Add-on orders for these samples will be processed based on acceptable specimen integrity and analyte stability, which may vary by analyte.        Ct Head Wo Cont      Result Date: 03/12/2019   IMPRESSION: No evidence of acute abnormality            Assessment:        Active Problems:     Multiple sclerosis exacerbation ([place]) (03/12/2019)              Plan:        1. Lower extremity weakness: could be related to multiple sclerosis. Other etiology could be lumbar spine pathology.    - Will start patient on Methylprednisolone 1gm daily. First dose now   - Neurology consult   - MRI of brain   - Neuro check   - If MRI of the brain unremarkable consider lumbar MRI.      2. Chronic back pain - on Suboxone. Patient does not want to take her Suboxone at this time because she does not like how it makes her feel.  Requesting other medication.  Have discussed with her that we should not be changing her chronic pain medications  at this time.  She will be given high-dose steroids which will help with inflammation in the lower back.  Will monitor pain for now with steroids and reassess.      3. Elevated blood pressure: no h/o hypertension. Likely triggered by pain   - will monitor bp for now. Will treat accordingly in bp persistently above 170/90.      4.  Depression: No acute issues.  Recently started on Risperdal and mirtazapine.  Patient not interested in taking the medications at this time.      5.  Anemia:Normocytic normochromic.  Patient reports it is chronic.  No active signs of bleeding.   -Check iron panel, B12 and  folate for now.      DVT prophylaxis: SCDs   CODE STATUS: Full code      Point of contact: She has designated her mother Sawyer TO [facility] with Use of Assistive Devices         Signed By:  Revonda Standard, MD           Mar 12, 2019

## 2019-03-12 NOTE — ED Notes (Signed)
Right leg weakness for 4 months with history of MS. Today at 11 am she was in a class and she had slurred speech that was detected by an Art therapist.  She called her doctor yesterday for slurred speech to get her appointment moved up from [place]. She does not know how long her speech has been slurred.

## 2019-03-12 NOTE — ED Provider Notes (Signed)
HPI       69y F with hx of MS (diagnosed a few years ago on MRI but never saw neurology) here with worsening RLE weakness. Has been a problem the past few months but worse in the past few days. Also with slurred speech off and on the past day or two. Had an episode of slurred speech at 11am today that resolved. Is supposed to be seeing neurology in the near future and called to let them know what was going on. Told to go to the ED and would need IV steroids. No recent illnesses. No fever. No vomiting/diarrhea. No urinary sx's.    Past Medical History:   Diagnosis Date   ??? ADHD    ??? Asthma    ??? Depression    ??? Fibromyalgia    ??? Gastroenteritis    ??? Hydradenitis     suppurativa   ??? Osteoarthritis    ??? Other ill-defined conditions(799.89)     chronic back pain   ??? Psychiatric disorder        Past Surgical History:   Procedure Laterality Date   ??? HX GYN      tubes tided         Family History:   Problem Relation Age of Onset   ??? Alcohol abuse Neg Hx    ??? Arthritis-rheumatoid Neg Hx    ??? Asthma Neg Hx    ??? Bleeding Prob Neg Hx    ??? Cancer Neg Hx    ??? Diabetes Neg Hx    ??? Elevated Lipids Neg Hx    ??? Headache Neg Hx    ??? Heart Disease Neg Hx    ??? Hypertension Neg Hx    ??? Lung Disease Neg Hx    ??? Migraines Neg Hx    ??? Psychiatric Disorder Neg Hx    ??? Stroke Neg Hx    ??? Mental Retardation Neg Hx        Social History     Socioeconomic History   ??? Marital status: SINGLE     Spouse name: Not on file   ??? Number of children: Not on file   ??? Years of education: Not on file   ??? Highest education level: Not on file   Occupational History   ??? Not on file   Social Needs   ??? Financial resource strain: Not on file   ??? Food insecurity     Worry: Not on file     Inability: Not on file   ??? Transportation needs     Medical: Not on file     Non-medical: Not on file   Tobacco Use   ??? Smoking status: Never Smoker   ??? Smokeless tobacco: Never Used   Substance and Sexual Activity   ??? Alcohol use: No   ??? Drug use: No     Types: Prescription,  Opiates   ??? Sexual activity: Not on file   Lifestyle   ??? Physical activity     Days per week: Not on file     Minutes per session: Not on file   ??? Stress: Not on file   Relationships   ??? Social Product manager on phone: Not on file     Gets together: Not on file     Attends religious service: Not on file     Active member of club or organization: Not on file     Attends meetings of clubs or organizations: Not  on file     Relationship status: Not on file   ??? Intimate partner violence     Fear of current or ex partner: Not on file     Emotionally abused: Not on file     Physically abused: Not on file     Forced sexual activity: Not on file   Other Topics Concern   ??? Not on file   Social History Narrative    ** Merged History Encounter **              ALLERGIES: Aspirin; Aspirin; Ibuprofen; and Ibuprofen    Review of Systems   Review of Systems   Constitutional: (-) weight loss.   HEENT: (-) stiff neck   Eyes: (-) discharge.   Respiratory: (-) cough.    Cardiovascular: (-) syncope.   Gastrointestinal: (-) blood in stool.   Genitourinary: (-) hematuria.  Musculoskeletal: (-) myalgias.   Neurological: (-) seizure.   Skin: (-) petechiae  Lymph/Immunologic: (-) enlarged lymph nodes  All other systems reviewed and are negative.        Vitals:    03/12/19 1417   BP: (!) 147/91   Pulse: 72   Resp: 18   Temp: 99 ??F (37.2 ??C)   SpO2: 100%            Physical Exam Nursing note and vitals reviewed.  Constitutional: oriented to person, place, and time. appears well-developed and well-nourished. No distress.  Head: Normocephalic and atraumatic. Sclera anicteric  Nose: No rhinorrhea  Mouth/Throat: Oropharynx is clear and moist. Pharynx normal  Eyes: Conjunctivae are normal. Pupils are equal, round, and reactive to light. Right eye exhibits no discharge. Left eye exhibits no discharge.  Neck: Painless normal range of motion. Neck supple. No LAD.  Cardiovascular: Normal rate, regular rhythm, normal heart sounds and intact distal  pulses.  Exam reveals no gallop and no friction rub.  No murmur heard.  Pulmonary/Chest:  No respiratory distress. No wheezes. No rales. No rhonchi. No increased work of breathing. No accessory muscle use. Good air exchange throughout.  Abdominal: soft, non-tender, no rebound or guarding. No hepatosplenomegaly. Normal bowel sounds throughout.  Back: no tenderness to palpation, no deformities, no CVA tenderness  Extremities/Musculoskeletal: Normal range of motion. no tenderness. No edema. Distal extremities are neurovasc intact.  Lymphadenopathy:   No adenopathy.   Neurological:  CN II-XII intact, 4/5 strength in RUE and RLE, 5/5 elsewhere, decreased sensation to touch in RLE but normal elsewhere, nl cerebellar function, nl speech/fluency, nl gait/station, no pronator drift. Normal mental status.  Skin: Skin is warm and dry. No rash noted. No pallor.        MDM 20y F here with worsening RLE weakness and slurred speech. Sounds subacute. Hx of MS. Will check labs and CT. May need admit for further workup and management.         Procedures      Hospitalist Perfect Serve for Admission  6:09 PM    ED Room Number: ER10/10  Patient Name and age:  Anita Bell 48 y.o.  female  Working Diagnosis: No diagnosis found.    COVID-19 Suspicion:  no    Code Status:  Full Code  Readmission: no  Isolation Requirements:  no  Recommended Level of Care:  med/surg  Department:[facility] ED - [phone]  Other:  35y F with hx of MS here with worsening RLE weakness. Ongoing for a few months but worse in the past few days - hard to ambulate.  Also having slurred speech yesterday and today (although improving on arrival to the ED). She called neuro who said she needed to come in for IV steroids.

## 2019-03-12 NOTE — Progress Notes (Signed)
Admission Medication Reconciliation:    Information obtained from:  [facility]   RxQuery data available:  YES    Comments/Recommendations: Updated PTA meds/reviewed patient's allergies.    1)  [facility] [phone] provided medication information.  Previous medication list is from 2010 so there have been several changes     2)  Medication changes (since last review):  Added  - Suboxone  - Duloxetine  - Famotidine  - Gabapentin   - Mirtazapine  - Naproxen  - Risperidone       Adjusted  - None    Removed  - Augmentin  - Adderall  - Lidocaine  - Omeprazole  - Pregabalin  - Sertraline  - Tramadol      RxQuery pharmacy benefit data reflects medications filled and processed through the patient's insurance, however   this data does NOT capture whether the medication was picked up or is currently being taken by the patient.    Allergies:  Aspirin; Aspirin; Ibuprofen; and Ibuprofen    Significant PMH/Disease States:   Past Medical History:   Diagnosis Date    ADHD     Asthma     Depression     Fibromyalgia     Gastroenteritis     Hydradenitis     suppurativa    Osteoarthritis     Other ill-defined conditions(799.89)     chronic back pain    Psychiatric disorder      Chief Complaint for this Admission:    Chief Complaint   Patient presents with    Extremity Weakness     Prior to Admission Medications:   Prior to Admission Medications   Prescriptions Last Dose Informant Taking?   DULoxetine (CYMBALTA) 60 mg capsule   Yes   Sig: Take 60 mg by mouth daily.   buprenorphine-naloxone (SUBOXONE) 8-2 mg film sublingaul film   Yes   Sig: 1 Film by SubLINGual route three (3) times daily.   famotidine (PEPCID) 20 mg tablet   Yes   Sig: Take 20 mg by mouth two (2) times a day.   gabapentin (NEURONTIN) 800 mg tablet   Yes   Sig: Take 800 mg by mouth three (3) times daily.   mirtazapine (REMERON SOL-TAB) 15 mg disintegrating tablet   Yes   Sig: Take 15 mg by mouth nightly.   naproxen (NAPROSYN) 375 mg tablet   Yes   Sig: Take 375 mg by  mouth two (2) times daily (with meals).   risperiDONE (RisperDAL) 1 mg tablet   Yes   Sig: Take 1 mg by mouth daily.      Facility-Administered Medications: None       Please contact the main inpatient pharmacy with any questions or concerns at [phone] and we will direct you to the clinical pharmacist covering this patient's care while in-house.   Drue Dun, PHARMD

## 2019-03-12 NOTE — Progress Notes (Signed)
Admission Medication Reconciliation:    Information obtained from:  [facility] available??:  YES    Comments/Recommendations: Updated PTA meds/reviewed patient's allergies.    1)  [facility] [phone] provided medication information.  Previous medication list is from 2010 so there have been several changes     2)  Medication changes (since last review):  Added  - Suboxone  - Duloxetine  - Famotidine  - Gabapentin   - Mirtazapine  - Naproxen  - Risperidone       Adjusted  - None    Removed  - Augmentin  - Adderall  - Lidocaine  - Omeprazole  - Pregabalin  - Sertraline  - Tramadol      ??RxQuery pharmacy benefit data reflects medications filled and processed through the patient's insurance, however   this data does NOT capture whether the medication was picked up or is currently being taken by the patient.    Allergies:  Aspirin; Aspirin; Ibuprofen; and Ibuprofen    Significant PMH/Disease States:   Past Medical History:   Diagnosis Date    ADHD     Asthma     Depression     Fibromyalgia     Gastroenteritis     Hydradenitis     suppurativa    Osteoarthritis     Other ill-defined conditions(799.89)     chronic back pain    Psychiatric disorder      Chief Complaint for this Admission:    Chief Complaint   Patient presents with    Extremity Weakness     Prior to Admission Medications:   Prior to Admission Medications   Prescriptions Last Dose Informant Taking?   DULoxetine (CYMBALTA) 60 mg capsule   Yes   Sig: Take 60 mg by mouth daily.   buprenorphine-naloxone (SUBOXONE) 8-2 mg film sublingaul film   Yes   Sig: 1 Film by SubLINGual route three (3) times daily.   famotidine (PEPCID) 20 mg tablet   Yes   Sig: Take 20 mg by mouth two (2) times a day.   gabapentin (NEURONTIN) 800 mg tablet   Yes   Sig: Take 800 mg by mouth three (3) times daily.   mirtazapine (REMERON SOL-TAB) 15 mg disintegrating tablet   Yes   Sig: Take 15 mg by mouth nightly.   naproxen (NAPROSYN) 375 mg tablet   Yes    Sig: Take 375 mg by mouth two (2) times daily (with meals).   risperiDONE (RisperDAL) 1 mg tablet   Yes   Sig: Take 1 mg by mouth daily.      Facility-Administered Medications: None       Please contact the main inpatient pharmacy with any questions or concerns at [phone] and we will direct you to the clinical pharmacist covering this patient's care while in-house.   Colletta [place], PHARMD

## 2019-03-12 NOTE — H&P (Signed)
[facility]  History and Physical    Primary Care Provider: None  Date of Service:  03/12/2019    Subjective:     Anita Bell is a 48 y.o. female with h/o chronic back pain, ADHD, depression and MS diagnosed 3 years ago presents to the ED complaining of right lower extremity weakness for several months but worse in the last 2 days. She also report slurred speech this morning but now resolved. She called her neurologist who recommended patient to come to the emergency room for evaluation and possible IV steroids as per her report. Patient has never followed with a neurologist but was scheduled to see one.  She denies headache, visual disturbance, decreased sensation, SOB, chest pain, nausea, vomiting, abdominal pain or urinary symptoms.  Initial diagnosis of MS with some when she started experiencing lower extremity weakness.  She states that she had MRI that showed MS (no record available).  Patient also has history of chronic back pain due to mechanical fall on Suboxone.  Patient has been experiencing worsening lower back pain bilaterally.  Pain does not radiate.  She denies any decreased sensation of the lower extremities.  Denies urinary retention or fecal incontinence.  No saddle paresthesia.  No recent trauma or injury.      Review of Systems:    Review of Systems   Constitutional: Negative for chills, fever, malaise/fatigue and weight loss.   HENT: Negative for congestion, ear discharge and hearing loss.    Eyes: Positive for blurred vision. Negative for double vision, photophobia, pain, discharge and redness.   Respiratory: Negative for cough, sputum production, shortness of breath and wheezing.    Cardiovascular: Negative for chest pain, palpitations, orthopnea, leg swelling and PND.   Gastrointestinal: Negative for abdominal pain, constipation, diarrhea, melena, nausea and vomiting.   Genitourinary: Negative for dysuria, frequency and urgency.    Musculoskeletal: Negative for back pain, joint pain and myalgias.   Skin: Negative for itching and rash.   Neurological: Positive for focal weakness. Negative for dizziness, sensory change, speech change, weakness and headaches.   Endo/Heme/Allergies: Negative for polydipsia. Does not bruise/bleed easily.       Past Medical History:   Diagnosis Date   ??? ADHD    ??? Asthma    ??? Depression    ??? Fibromyalgia    ??? Gastroenteritis    ??? Hydradenitis     suppurativa   ??? Osteoarthritis    ??? Other ill-defined conditions(799.89)     chronic back pain   ??? Psychiatric disorder       Past Surgical History:   Procedure Laterality Date   ??? HX GYN      tubes tided     Prior to Admission medications    Medication Sig Start Date End Date Taking? Authorizing Provider   buprenorphine-naloxone (SUBOXONE) 8-2 mg film sublingaul film 1 Film by SubLINGual route three (3) times daily.   Yes Provider, Historical   DULoxetine (CYMBALTA) 60 mg capsule Take 60 mg by mouth daily.   Yes Provider, Historical   famotidine (PEPCID) 20 mg tablet Take 20 mg by mouth two (2) times a day.   Yes Provider, Historical   gabapentin (NEURONTIN) 800 mg tablet Take 800 mg by mouth three (3) times daily.   Yes Provider, Historical   mirtazapine (REMERON SOL-TAB) 15 mg disintegrating tablet Take 15 mg by mouth nightly.   Yes Provider, Historical   naproxen (NAPROSYN) 375 mg tablet Take 375 mg  by mouth two (2) times daily (with meals).   Yes Provider, Historical   risperiDONE (RisperDAL) 1 mg tablet Take 1 mg by mouth daily.   Yes Provider, Historical     Allergies   Allergen Reactions   ??? Aspirin Nausea and Vomiting   ??? Aspirin Hives   ??? Ibuprofen Other (comments)     Upsets her stomach   ??? Ibuprofen Other (comments)     Abdominal pain        Family History   Problem Relation Age of Onset   ??? Alcohol abuse Neg Hx    ??? Arthritis-rheumatoid Neg Hx    ??? Asthma Neg Hx    ??? Bleeding Prob Neg Hx    ??? Cancer Neg Hx    ??? Diabetes Neg Hx    ??? Elevated Lipids Neg Hx     ??? Headache Neg Hx    ??? Heart Disease Neg Hx    ??? Hypertension Neg Hx    ??? Lung Disease Neg Hx    ??? Migraines Neg Hx    ??? Psychiatric Disorder Neg Hx    ??? Stroke Neg Hx    ??? Mental Retardation Neg Hx         SOCIAL HISTORY:  Patient resides at   Patient ambulates with cane  Smoking history: Never  Alcohol history: none        Objective:       Physical Exam:   Visit Vitals  BP (!) 147/91 (BP 1 Location: Right arm, BP Patient Position: At rest)   Pulse 72   Temp 99 ??F (37.2 ??C)   Resp 18   SpO2 100%     GEN APPEARANCE: Patient resting in bed in NAD  HEENT: Conjunctiva Clear  CVS: RRR, S1, S2; No M/G/R  LUNGS: CTAB; No Wheezes; No Rhonchi: No rales  ABD: Soft; No TTP; + Normoactive BS  EXT: no edema   Skin exam: No gross lesions noted on exposed skin surfaces  MENTAL STATUS: Answers questions appropriately, responds to commands.   Neuro: Cranial nerve exam: EOMI, CN V sensory/motor intact, no facial asymmetry noted, patient able to hearl, uvula midline, able to move tongue left/right. motor strength 3 out of 5 right lower extremity and 4 out of 5 left lower extremity.  Upper extremity normal motor strength.  Sensation is intact. Movement of the LE limited by lower back pain  Back: Normal curvature.  Tender to palpation at the level of L4-L5, paraspinal muscle tenderness and bilateral flank pain.  Straight leg test positive bilaterally.    Data Review:   Recent Results (from the past 24 hour(s))   CBC WITH AUTOMATED DIFF    Collection Time: 03/12/19  3:41 PM   Result Value Ref Range    WBC 5.6 3.6 - 11.0 K/uL    RBC 3.40 (L) 3.80 - 5.20 M/uL    HGB 9.6 (L) 11.5 - 16.0 g/dL    HCT 30.5 (L) 35.0 - 47.0 %    MCV 89.7 80.0 - 99.0 FL    MCH 28.2 26.0 - 34.0 PG    MCHC 31.5 30.0 - 36.5 g/dL    RDW 14.6 (H) 11.5 - 14.5 %    PLATELET 247 150 - 400 K/uL    MPV 11.3 8.9 - 12.9 FL    NRBC 0.0 0 PER 100 WBC    ABSOLUTE NRBC 0.00 0.00 - 0.01 K/uL    NEUTROPHILS 44 32 - 75 %    LYMPHOCYTES  46 12 - 49 %    MONOCYTES 6 5 - 13 %     EOSINOPHILS 3 0 - 7 %    BASOPHILS 1 0 - 1 %    IMMATURE GRANULOCYTES 0 0.0 - 0.5 %    ABS. NEUTROPHILS 2.5 1.8 - 8.0 K/UL    ABS. LYMPHOCYTES 2.6 0.8 - 3.5 K/UL    ABS. MONOCYTES 0.4 0.0 - 1.0 K/UL    ABS. EOSINOPHILS 0.2 0.0 - 0.4 K/UL    ABS. BASOPHILS 0.0 0.0 - 0.1 K/UL    ABS. IMM. GRANS. 0.0 0.00 - 0.04 K/UL    DF AUTOMATED     METABOLIC PANEL, COMPREHENSIVE    Collection Time: 03/12/19  3:41 PM   Result Value Ref Range    Sodium 136 136 - 145 mmol/L    Potassium 3.4 (L) 3.5 - 5.1 mmol/L    Chloride 105 97 - 108 mmol/L    CO2 26 21 - 32 mmol/L    Anion gap 5 5 - 15 mmol/L    Glucose 82 65 - 100 mg/dL    BUN 14 6 - 20 MG/DL    Creatinine 1.01 0.55 - 1.02 MG/DL    BUN/Creatinine ratio 14 12 - 20      GFR est AA >60 >60 ml/min/1.56m    GFR est non-AA 59 (L) >60 ml/min/1.718m   Calcium 8.7 8.5 - 10.1 MG/DL    Bilirubin, total 0.5 0.2 - 1.0 MG/DL    ALT (SGPT) 20 12 - 78 U/L    AST (SGOT) 16 15 - 37 U/L    Alk. phosphatase 52 45 - 117 U/L    Protein, total 7.5 6.4 - 8.2 g/dL    Albumin 3.4 (L) 3.5 - 5.0 g/dL    Globulin 4.1 (H) 2.0 - 4.0 g/dL    A-G Ratio 0.8 (L) 1.1 - 2.2     MAGNESIUM    Collection Time: 03/12/19  3:41 PM   Result Value Ref Range    Magnesium 2.1 1.6 - 2.4 mg/dL   SAMPLES BEING HELD    Collection Time: 03/12/19  3:41 PM   Result Value Ref Range    SAMPLES BEING HELD 1 red, 1 blue      COMMENT        Add-on orders for these samples will be processed based on acceptable specimen integrity and analyte stability, which may vary by analyte.     Ct Head Wo Cont    Result Date: 03/12/2019  IMPRESSION: No evidence of acute abnormality       Assessment:     Active Problems:    [place]        Plan:     1. Lower extremity weakness: could be related to multiple sclerosis. Other etiology could be lumbar spine pathology.   - Will start patient on Methylprednisolone 1gm daily. First dose now  - Neurology consult  - MRI of brain  - Neuro check   - If MRI of the brain unremarkable consider lumbar MRI.    2. Chronic back pain - on Suboxone. Patient does not want to take her Suboxone at this time because she does not like how it makes her feel.  Requesting other medication.  Have discussed with her that we should not be changing her chronic pain medications at this time.  She will be given high-dose steroids which will help with inflammation in the lower back.  Will monitor pain for  now with steroids and reassess.    3. Elevated blood pressure: no h/o hypertension. Likely triggered by pain  - will monitor bp for now. Will treat accordingly in bp persistently above 170/90.    4.  Depression: No acute issues.  Recently started on Risperdal and mirtazapine.  Patient not interested in taking the medications at this time.    5.  Anemia:Normocytic normochromic.  Patient reports it is chronic.  No active signs of bleeding.  -Check iron panel, B12 and folate for now.    DVT prophylaxis: SCDs  CODE STATUS: Full code    Point of contact: She has designated her mother [place] [facility] with Use of Assistive Devices    Signed By: Revonda Standard, MD     Mar 12, 2019

## 2019-03-12 NOTE — ED Triage Notes (Addendum)
Right leg weakness for 4 months with history of MS. Today at 11 am she was in a class and she had slurred speech that was detected by an Art therapist.  She called her doctor yesterday for slurred speech to get her appointment moved up from [place]. She does not know how long her speech has been slurred.

## 2019-03-13 LAB — CULTURE, MRSA

## 2019-03-13 LAB — URINALYSIS W/ RFLX MICROSCOPIC
Bilirubin, Urine: NEGATIVE
Bilirubin: NEGATIVE
Blood, Urine: NEGATIVE
Blood: NEGATIVE
Glucose, Ur: 250 mg/dL — AB
Glucose: 250 mg/dL — AB
Ketone: NEGATIVE mg/dL
Ketones, Urine: NEGATIVE mg/dL
Leukocyte Esterase, Urine: NEGATIVE
Leukocyte Esterase: NEGATIVE
Nitrite, Urine: NEGATIVE
Nitrites: NEGATIVE
Protein, UA: NEGATIVE mg/dL
Protein: NEGATIVE mg/dL
Specific Gravity, UA: 1.015 (ref 1.003–1.030)
Specific gravity: 1.015 (ref 1.003–1.030)
Urobilinogen, UA, POCT: 1 EU/dL (ref 0.2–1.0)
Urobilinogen: 1 EU/dL (ref 0.2–1.0)
pH (UA): 6.5 (ref 5.0–8.0)
pH, UA: 6.5 (ref 5.0–8.0)

## 2019-03-13 MED ORDER — FAMOTIDINE 20 MG TAB
20 mg | Freq: Two times a day (BID) | ORAL | Status: DC
Start: 2019-03-13 — End: 2019-03-15
  Administered 2019-03-13 – 2019-03-15 (×6): via ORAL

## 2019-03-13 MED ORDER — DULOXETINE 60 MG CAP, DELAYED RELEASE
60 mg | Freq: Every day | ORAL | Status: DC
Start: 2019-03-13 — End: 2019-03-15
  Administered 2019-03-13 – 2019-03-15 (×3): via ORAL

## 2019-03-13 MED ORDER — MELOXICAM 7.5 MG TAB
7.5 mg | Freq: Two times a day (BID) | ORAL | Status: DC
Start: 2019-03-13 — End: 2019-03-15
  Administered 2019-03-14 – 2019-03-15 (×4): via ORAL

## 2019-03-13 MED ORDER — BUPRENORPHINE-NALOXONE 8 MG-2 MG SUBLINGUAL TAB
8-2 mg | Freq: Three times a day (TID) | SUBLINGUAL | Status: DC
Start: 2019-03-13 — End: 2019-03-15
  Administered 2019-03-13 – 2019-03-15 (×7): via SUBLINGUAL

## 2019-03-13 MED ORDER — GABAPENTIN 400 MG CAP
400 mg | Freq: Three times a day (TID) | ORAL | Status: DC
Start: 2019-03-13 — End: 2019-03-15
  Administered 2019-03-13 – 2019-03-15 (×9): via ORAL

## 2019-03-13 MED ORDER — LIDOCAINE 4 % TOPICAL CREAM
4 % | CUTANEOUS | Status: DC | PRN
Start: 2019-03-13 — End: 2019-03-15
  Administered 2019-03-13: 17:00:00 via TOPICAL

## 2019-03-13 MED ORDER — RISPERIDONE 1 MG TAB
1 mg | Freq: Every evening | ORAL | Status: DC
Start: 2019-03-13 — End: 2019-03-15
  Administered 2019-03-14: 03:00:00 via ORAL

## 2019-03-13 MED ORDER — TRAMADOL 50 MG TAB
50 mg | Freq: Four times a day (QID) | ORAL | Status: DC | PRN
Start: 2019-03-13 — End: 2019-03-15

## 2019-03-13 MED ORDER — ONDANSETRON (PF) 4 MG/2 ML INJECTION
4 mg/2 mL | INTRAMUSCULAR | Status: DC | PRN
Start: 2019-03-13 — End: 2019-03-15

## 2019-03-13 MED ORDER — OXYCODONE-ACETAMINOPHEN 10 MG-325 MG TAB
10-325 mg | Freq: Once | ORAL | Status: AC
Start: 2019-03-13 — End: 2019-03-13
  Administered 2019-03-13: 08:00:00 via ORAL

## 2019-03-13 MED FILL — ANECREAM 4 % TOPICAL: 4 % | CUTANEOUS | Qty: 5

## 2019-03-13 MED FILL — NORMAL SALINE FLUSH 0.9 % INJECTION SYRINGE: INTRAMUSCULAR | Qty: 10

## 2019-03-13 MED FILL — SOLU-MEDROL 1,000 MG INTRAVENOUS SOLUTION: 1000 mg | INTRAVENOUS | Qty: 1000

## 2019-03-13 MED FILL — GABAPENTIN 400 MG CAP: 400 mg | ORAL | Qty: 2

## 2019-03-13 MED FILL — FAMOTIDINE 20 MG TAB: 20 mg | ORAL | Qty: 1

## 2019-03-13 MED FILL — DULOXETINE 60 MG CAP, DELAYED RELEASE: 60 mg | ORAL | Qty: 1

## 2019-03-13 MED FILL — OXYCODONE-ACETAMINOPHEN 10 MG-325 MG TAB: 10-325 mg | ORAL | Qty: 1

## 2019-03-13 MED FILL — LIDOCAINE 4 % TOPICAL CREAM: 4 % | CUTANEOUS | Qty: 5

## 2019-03-13 MED FILL — TRAMADOL 50 MG TAB: 50 mg | ORAL | Qty: 1

## 2019-03-13 MED FILL — BUPRENORPHINE-NALOXONE 8 MG-2 MG SUBLINGUAL TAB: 8-2 mg | SUBLINGUAL | Qty: 1

## 2019-03-13 NOTE — Progress Notes (Addendum)
Reason for Admission:   Slurred speech and right lower extremity weakness                   RUR Score:    9%                 Plan for utilizing home health:     TBD     PCP: First and Last name:  Need a PCP   Name of Practice:    Are you a current patient: Yes/No: no   Approximate date of last visit:                     Current Advanced Directive/Advance Care Plan: not on file                         Transition of Care Plan:    PT has recommended Inpatient Rehabilitation or Home Health with PT, RW, Gait Belt  and BSC. She is interested in [facility] but would like for it to be a facility on the bus line. Referral sent to [facility] and [facility].    Patient  lives with her husband. He helps with her ADL`s. She is currently trying to get disability. Husband is employed with  a Advertising copywriter. They do not have transportation. Transportation is usually through [employer].        Care Management Interventions  PCP Verified by CM: (need a PCP)  Mode of Transport at Discharge: (insurance transportation)  Transition of Care Consult (CM Consult): Discharge Planning  Physical Therapy Consult: No  Occupational Therapy Consult: No  Speech Therapy Consult: No  Current Support Network: Lives with Caregiver  Confirm Follow Up Transport: Other (see comment)(plan to use insurance transportation)  The Plan for Transition of Care is Related to the Following Treatment Goals : slurred speech and right le weakness          12:53 pm- [facility] - no beds available  12: 56PM per D.C. with [facility]- will review and work up for approval  CM will continue to follow/ Iran Sizer RN CRM [phone]

## 2019-03-13 NOTE — Other (Signed)
During interdisciplinary rounds at 0930. Care management, physical therapy, and nursing discussed discharge and plan of care. See clinical pathway and/or care plan for interventions and desired outcomes.

## 2019-03-13 NOTE — Progress Notes (Signed)
Bedside and Verbal shift change report given to Joyce, RN (oncoming nurse) by Paul, RN (offgoing nurse). Report included the following information SBAR, Kardex, Intake/Output, MAR, Recent Results, Med Rec Status, Cardiac Rhythm SR and Dual Neuro Assessment.

## 2019-03-13 NOTE — Progress Notes (Signed)
Reason for Admission:   Slurred speech and right lower extremity weakness                   RUR Score:    9%                 Plan for utilizing home health:     TBD     PCP: First and Last name:  Need a PCP   Name of Practice:    Are you a current patient: Yes/No: no   Approximate date of last visit:                     Current Advanced Directive/Advance Care Plan: not on file                         Transition of Care Plan:    PT has recommended Inpatient Rehabilitation or Home Health with PT, RW, Gait Belt  and BSC. She is interested in [facility] but would like for it to be a facility on the bus line. Referral sent to [facility] and [facility].via [employer].    Patient  lives with her husband. He helps with her ADL`s. She is currently trying to get disability. Husband is employed with  a Public house manager. They do not have transportation. Transportation is usually through [employer].        Care Management Interventions  PCP Verified by CM: (need a PCP)  Mode of Transport at Discharge: (insurance transportation)  Transition of Care Consult (CM Consult): Discharge Planning  Physical Therapy Consult: No  Occupational Therapy Consult: No  Speech Therapy Consult: No  Current Support Network: Lives with Caregiver  Confirm Follow Up Transport: Other (see comment)(plan to use insurance transportation)  The Plan for Transition of Care is Related to the Following Treatment Goals : slurred speech and right le weakness          12:53 pm- [facility] - no beds available  12: 56PM per D.C. with [facility]- will review and work up for approval  CM will continue to follow/ Monico Blitz RN CRM [phone]

## 2019-03-13 NOTE — Progress Notes (Signed)
Problem: Risk for Spread of Infection  Goal: Prevent transmission of infectious organism to others  Description: Prevent the transmission of infectious organisms to other patients, staff members, and visitors.  Outcome: Progressing Towards Goal     Problem: Pain  Goal: *Control of Pain  Outcome: Progressing Towards Goal     Problem: General Medical Care Plan  Goal: *Vital signs within specified parameters  Outcome: Progressing Towards Goal  Goal: *Labs within defined limits  Outcome: Progressing Towards Goal  Goal: *Absence of infection signs and symptoms  Outcome: Progressing Towards Goal  Goal: *Optimal pain control at patient's stated goal  Outcome: Progressing Towards Goal  Goal: *Skin integrity maintained  Outcome: Progressing Towards Goal  Goal: *Fluid volume balance  Outcome: Progressing Towards Goal  Goal: *Optimize nutritional status  Outcome: Progressing Towards Goal  Goal: *Anxiety reduced or absent  Outcome: Progressing Towards Goal  Goal: *Progressive mobility and function (eg: ADL's)  Outcome: Progressing Towards Goal

## 2019-03-13 NOTE — Progress Notes (Signed)
Problem: Risk for Spread of Infection  Goal: Prevent transmission of infectious organism to others  Description: Prevent the transmission of infectious organisms to other patients, staff members, and visitors.  Outcome: Progressing Towards Goal     Problem: Patient Education:  Go to Education Activity  Goal: Patient/Family Education  Outcome: Progressing Towards Goal     Problem: Pain  Goal: *Control of Pain  Outcome: Progressing Towards Goal     Problem: General Medical Care Plan  Goal: *Vital signs within specified parameters  Outcome: Progressing Towards Goal  Goal: *Labs within defined limits  Outcome: Progressing Towards Goal  Goal: *Absence of infection signs and symptoms  Outcome: Progressing Towards Goal  Goal: *Optimal pain control at patient's stated goal  Outcome: Progressing Towards Goal  Goal: *Skin integrity maintained  Outcome: Progressing Towards Goal  Goal: *Fluid volume balance  Outcome: Progressing Towards Goal  Goal: *Optimize nutritional status  Outcome: Progressing Towards Goal  Goal: *Anxiety reduced or absent  Outcome: Progressing Towards Goal  Goal: *Progressive mobility and function (eg: ADL's)  Outcome: Progressing Towards Goal     Problem: Falls - Risk of  Goal: *Absence of Falls  Description: Document Schmid Fall Risk and appropriate interventions in the flowsheet.  Outcome: Progressing Towards Goal  Note: Fall Risk Interventions:            Medication Interventions: Patient to call before getting OOB, Teach patient to arise slowly                   Problem: Patient Education: Go to Patient Education Activity  Goal: Patient/Family Education  Outcome: Progressing Towards Goal     Problem: Pressure Injury - Risk of  Goal: *Prevention of pressure injury  Description: Document Braden Scale and appropriate interventions in the flowsheet.  Outcome: Progressing Towards Goal  Note: Pressure Injury Interventions:  Sensory Interventions: Discuss PT/OT consult with provider, Minimize linen  layers         Activity Interventions: Increase time out of bed, PT/OT evaluation    Mobility Interventions: HOB 30 degrees or less, PT/OT evaluation    Nutrition Interventions: Document food/fluid/supplement intake, Offer support with meals,snacks and hydration                     Problem: Patient Education: Go to Patient Education Activity  Goal: Patient/Family Education  Outcome: Progressing Towards Goal

## 2019-03-13 NOTE — Progress Notes (Signed)
 Problem: Mobility Impaired (Adult and Pediatric)  Goal: *Acute Goals and Plan of Care (Insert Text)  Description: FUNCTIONAL STATUS PRIOR TO ADMISSION: Pt has hx of MS, diagnosed a few years ago, reports decline in mobility over last 3 months.  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  Pt lives with her husband in a two story apartment.  She was needing help for all mobility and some ADLs prior to admission.        HOME SUPPORT PRIOR TO ADMISSION: Needs help for all mobility but is home alone most of the day while her husband is at work    Physical Therapy Goals  Initiated 03/13/2019  1.  Patient will move from supine to sit and sit to supine , scoot up and down and roll side to side in bed with modified independence within 7 day(s).    2.  Patient will transfer from bed to chair and chair to bed with modified independence using the least restrictive device within 7 day(s).  3.  Patient will perform sit to stand with modified independence within 7 day(s).  4.  Patient will ambulate with modified independence for 50 feet with the least restrictive device within 7 day(s).   5.  Patient will ascend/descend 12 stairs with single handrail(s) with moderate assistance  within 7 day(s).  Outcome: Progressing Towards Goal  PHYSICAL THERAPY EVALUATION  Patient: Anita Bell (48 y.o. female)  Date: 03/13/2019  Primary Diagnosis: Multiple sclerosis exacerbation (HCC) [G35]        Precautions: Fall         ASSESSMENT  Based on the objective data described below, the patient presents with weakness (R>L), diminished sensation/coordination/AROM R side, impaired balance, RLE pain, poor insight into deficits, unsteady gait, and overall decline in functional mobility.  Per pt, she was diagnosed with MS a few years ago, but has never seen a neurologist.  She noticed decline in functional mobility ~3 months ago.  Since then, she hasn't been able to walk or navigate stairs without assistance from her husband who works during the day.  Pt  also reports hx SCI in 2004.  Pt with difficulty differentiating new vs old symptoms and demonstrates poor insight into her deficits.  This date, she transferred supine to sit with CGA and demonstrated fair sitting balance.  She transferred sit<>stand with CGA and was able to ambulate a short distance using RW with minA.   Pt is a fall risk and presents below her baseline level.  Recommending IP rehab upon discharge.        Current Level of Function Impacting Discharge (mobility/balance): minA for ambulation     Functional Outcome Measure:  The patient scored 4/56 on the Berg outcome measure which is indicative of high fall risk.      Other factors to consider for discharge: hx MS, hx SCI, high fall risk, reports financial difficulties getting equipment      Patient will benefit from skilled therapy intervention to address the above noted impairments.       PLAN :  Recommendations and Planned Interventions: bed mobility training, transfer training, gait training, therapeutic exercises, neuromuscular re-education, patient and family training/education, and therapeutic activities      Frequency/Duration: Patient will be followed by physical therapy:  5 times a week to address goals.    Recommendation for discharge: (in order for the patient to meet his/her long term goals)  Therapy 3 hours per day 5-7 days per week  If pt is to d/c  home, she will require home health PT, RW, gait belt, and BSC    This discharge recommendation:  Has not yet been discussed the attending provider and/or case management    IF patient discharges home will need the following DME: bedside commode, gait belt, and rolling walker         SUBJECTIVE:   Patient stated "I've just been holding it in until my husband gets home from work and can help me get to the bathroom."    OBJECTIVE DATA SUMMARY:   HISTORY:    Past Medical History:   Diagnosis Date    ADHD     Asthma     Depression     Fibromyalgia     Gastroenteritis     Hydradenitis      suppurativa    Osteoarthritis     Other ill-defined conditions(799.89)     chronic back pain    Psychiatric disorder      Past Surgical History:   Procedure Laterality Date    HX GYN      tubes tided     Home Situation  Home Environment: Apartment  One/Two Story Residence: Two story  Living Alone: No  Support Systems: Family member(s)  Patient Expects to be Discharged to:: Apartment  Current DME Used/Available at Home: Cane, straight    EXAMINATION/PRESENTATION/DECISION MAKING:   Critical Behavior:  Neurologic State: Alert  Orientation Level: Oriented X4  Cognition: Appropriate decision making, Appropriate for age attention/concentration, Appropriate safety awareness, Follows commands     Hearing:  Auditory  Auditory Impairment: None    Range Of Motion:  AROM: Generally decreased, functional           PROM: Within functional limits           Strength:    Strength: Generally decreased, functional                    Tone & Sensation:   Tone: Normal              Sensation: Impaired(R side diminished )               Coordination:  Coordination: Generally decreased, functional    Functional Mobility:  Bed Mobility:  Supine to Sit: Contact guard assistance  Sit to Supine: (up in chair)  Scooting: Contact guard assistance    Transfers:  Sit to Stand: Contact guard assistance  Stand to Sit: Contact guard assistance  Bed to Chair: Minimum assistance    Balance:   Sitting: Impaired  Sitting - Static: Fair (occasional)  Sitting - Dynamic: Fair (occasional)  Standing: Impaired;With support(RW)  Standing - Static: Fair;Constant support  Standing - Dynamic : Fair;Constant support    Ambulation/Gait Training:  Distance (ft): 25 Feet (ft)  Assistive Device: Gait belt;Walker, rolling  Ambulation - Level of Assistance: Minimal assistance;Assist x1  Gait Abnormalities: Antalgic;Decreased step clearance;Shuffling gait;Trunk sway increased  Base of Support: Narrowed  Stance: Left decreased;Right decreased  Speed/Cadence:  Slow;Shuffled  Step Length: Right shortened;Left shortened  Swing Pattern: Left asymmetrical;Right asymmetrical    Functional Measure:  Berg Balance Test:    Sitting to Standing: 1  Standing Unsupported: 0  Sitting with Back Unsupported: 3  Standing to Sitting: 0  Transfers: 0  Standing Unsupported with Eyes Closed: 0  Standing Unsupported with Feet Together: 0  Reach Forward with Outstretched Arm: 0  Pick Up Object: 0  Turn to Look Over Shoulders: 0  Turn 360 Degrees: 0  Alternate Foot  on Step/Stool: 0  Standing Unsupported One Foot in Front: 0  Stand on One Leg: 0  Total: 4/56         56=Maximum possible score;   0-20=High fall risk  21-40=Moderate fall risk   41-56=Low fall risk      Physical Therapy Evaluation Charge Determination   History Examination Presentation Decision-Making   HIGH Complexity :3+ comorbidities / personal factors will impact the outcome/ POC  HIGH Complexity : 4+ Standardized tests and measures addressing body structure, function, activity limitation and / or participation in recreation  MEDIUM Complexity : Evolving with changing characteristics  Other outcome measures Berg  HIGH       Based on the above components, the patient evaluation is determined to be of the following complexity level: MEDIUM    Pain Rating:  Reports pain in R hip and leg     Activity Tolerance:   Fair, fatigued quickly   Please refer to the flowsheet for vital signs taken during this treatment.    After treatment patient left in no apparent distress:   Sitting in chair, Call bell within reach, and Bed / chair alarm activated    COMMUNICATION/EDUCATION:   The patient's plan of care was discussed with: Registered nurse.     Fall prevention education was provided and the patient/caregiver indicated understanding., Patient/family have participated as able in goal setting and plan of care., and Patient/family agree to work toward stated goals and plan of care.    Thank you for this referral.  Delon Schneider, PT, DPT    Time Calculation: 36 mins

## 2019-03-13 NOTE — Progress Notes (Signed)
 Problem: Self Care Deficits Care Plan (Adult)  Goal: *Acute Goals and Plan of Care (Insert Text)  Description:   FUNCTIONAL STATUS PRIOR TO ADMISSION: Patient was modified independent-intermittent assist using a single point cane for functional mobility. Patient Min-Mod A for basic ADLs, dependent for IADLs, diagnosed with MS 4 months ago,  inconsistent report of symptoms with decreased understanding of dx. Fairly sedentary using urinal vs plastic bag for toileting, has to wait for husband to get home to have BM if able.     HOME SUPPORT: The patient lived with her significant other who works sometimes, leaving her home alone most days.    Occupational Therapy Goals  Initiated 03/13/2019  1.  Patient will perform grooming at the sink with supervision/set-up within 7 day(s).  2.  Patient will perform lower body dressing with standby assistance within 7 day(s).  3.  Patient will perform bathing with minimal assistance/contact guard assist within 7 day(s).  4.  Patient will perform toilet transfers with supervision/set-up within 7 day(s).  5.  Patient will perform all aspects of toileting with supervision/set-up within 7 day(s).  6.  Patient will participate in upper extremity therapeutic exercise/activities with supervision/set-up for 5 minutes within 7 day(s).    7.  Patient will utilize energy conservation techniques during functional activities with verbal cues within 7 day(s).           Outcome: Not Met     OCCUPATIONAL THERAPY EVALUATION  Patient: Anita Bell (48 y.o. female)  Date: 03/13/2019  Primary Diagnosis: Multiple sclerosis exacerbation (HCC) [G35]        Precautions: Fall    ASSESSMENT  Based on the objective data described below, the patient presents with impaired balance, coordination, sensation, strength, ROM, pain management, insight into deficits, standing tolerance, and problem solving s/p admission for MS exacerbation. PTA, patient requiring intermittent assist for ADLs and mobility with  SPC, inconsistent report provided, lives with significant other who works most days leaving her alone. She now presents well below her baseline, requiring Min-Mod A for ADLs, fair-poor standing balance noted with RW support. Patient reporting global numbness/tingling in BUEs & BLEs (R>L), 10+/10 pain reported, perseverating on needing pain medication. She has decreased sitting balance, donning her socks with a sidelying W sit, completed brief mobility in room with RLE dragging. She is a high fall & safety risk, does not have appropriate supports at home at this time. Recommend d/c to IPR to maximize safety & functional independence.    Current Level of Function Impacting Discharge (ADLs/self-care): Min-Mod A for ADLs, CGA-Min A for mobility with RW    Functional Outcome Measure:  The patient scored Total: 40/100 on the Barthel Index outcome measure which is indicative of 60% impaired ability to care for basic self needs/dependency on others; inferred 100% dependency on others for instrumental ADLs.        Other factors to consider for discharge: fall risk, PMH, PLOF, physical assist for all ADLs & mobility,  poor insight & education of MS diagnosis     Patient will benefit from skilled therapy intervention to address the above noted impairments.       PLAN :  Recommendations and Planned Interventions: self care training, functional mobility training, therapeutic exercise, balance training, therapeutic activities, endurance activities, neuromuscular re-education, patient education, home safety training, and family training/education    Frequency/Duration: Patient will be followed by occupational therapy 5 times a week to address goals.    Recommendation for discharge: (in order for the patient  to meet his/her long term goals)  Therapy 3 hours per day 5-7 days per week    This discharge recommendation:  Has been made in collaboration with the attending provider and/or case management    IF patient discharges home will  need the following DME: Gait belt, BSC (to double as shower chair), RW, and 24/7 supervision/physical assist         SUBJECTIVE:   Patient stated "Why is my right arm hurting? It's not supposed to hurt? Oh no, what is going on?."    OBJECTIVE DATA SUMMARY:   HISTORY:   Past Medical History:   Diagnosis Date    ADHD     Asthma     Depression     Fibromyalgia     Gastroenteritis     Hydradenitis     suppurativa    Osteoarthritis     Other ill-defined conditions(799.89)     chronic back pain    Psychiatric disorder      Past Surgical History:   Procedure Laterality Date    HX GYN      tubes tided       Expanded or extensive additional review of patient history:     Home Situation  Home Environment: Apartment  One/Two Story Residence: Two story  Living Alone: No  Support Systems: Family member(s)  Patient Expects to be Discharged to:: Apartment  Current DME Used/Available at Home: Cane, straight, Psychiatrist Type: Tub/Shower combination    Hand dominance: Right    EXAMINATION OF PERFORMANCE DEFICITS:  Cognitive/Behavioral Status:  Neurologic State: Alert  Orientation Level: Oriented X4  Cognition: Decreased attention/concentration;Follows commands;Poor safety awareness  Perception: Cues to maintain midline in standing(RLE dragging)  Perseveration: Perseverates during conversation(needs pain medication)  Safety/Judgement: Awareness of environment;Decreased awareness of need for assistance;Decreased awareness of need for safety;Decreased insight into deficits    Skin: Appears intact    Edema: None     Hearing:  Auditory  Auditory Impairment: None    Vision/Perceptual:    Tracking: Able to track stimulus in all quadrants w/o difficulty                      Acuity: Within Defined Limits         Range of Motion:  AROM: Generally decreased, functional  PROM: Generally decreased, functional(pain reported with attempted AAROM)                      Strength:  Strength: Generally decreased, functional                 Coordination:  Coordination: Generally decreased, functional  Fine Motor Skills-Upper: Left Intact;Right Intact    Gross Motor Skills-Upper: Left Intact;Right Intact    Tone & Sensation:  Tone: Normal  Sensation: Impaired(reports BUE & BLE numbness/tingling R>L)                      Balance:  Sitting: Impaired;Without support  Sitting - Static: Fair (occasional)  Sitting - Dynamic: Fair (occasional)  Standing: Impaired;With support(RW)  Standing - Static: Fair;Constant support  Standing - Dynamic : Fair;Constant support    Functional Mobility and Transfers for ADLs:  Bed Mobility:  Supine to Sit: Contact guard assistance  Sit to Supine: (up in chair)  Scooting: Contact guard assistance    Transfers:  Sit to Stand: Contact guard assistance  Stand to Sit: Contact guard assistance  Bed to Chair:  Minimum assistance  Assistive Device : Walker, rolling    ADL Assessment:  Feeding: Setup    Oral Facial Hygiene/Grooming: Minimum assistance    Bathing: Moderate assistance    Upper Body Dressing: Minimum assistance    Lower Body Dressing: Moderate assistance    Toileting: Moderate assistance                ADL Intervention and task modifications:                           Lower Body Dressing Assistance  Dressing Assistance: Moderate assistance  Pants With Elastic Waist: Moderate assistance(simulated, decr sit & stand balance)  Socks: Contact guard assistance(sidelying to don)  Leg Crossed Method Used: (W sit EOB)  Position Performed: Seated edge of bed(sidelying)  Cues: Physical assistance;Tactile cues provided;Verbal cues provided;Visual cues provided         Cognitive Retraining  Safety/Judgement: Awareness of environment;Decreased awareness of need for assistance;Decreased awareness of need for safety;Decreased insight into deficits    Therapeutic Exercise:  Sit<>stand with CGA-Min A and RW use, brief ambulation around the bed with Min A for stabilization, RLE dragging     Functional Measure:  Barthel  Index:    Bathing: 0  Bladder: 5  Bowels: 5  Grooming: 0  Dressing: 5  Feeding: 10  Mobility: 0  Stairs: 0  Toilet Use: 5  Transfer (Bed to Chair and Back): 10  Total: 40/100        The Barthel ADL Index: Guidelines  1. The index should be used as a record of what a patient does, not as a record of what a patient could do.  2. The main aim is to establish degree of independence from any help, physical or verbal, however minor and for whatever reason.  3. The need for supervision renders the patient not independent.  4. A patient's performance should be established using the best available evidence. Asking the patient, friends/relatives and nurses are the usual sources, but direct observation and common sense are also important. However direct testing is not needed.  5. Usually the patient's performance over the preceding 24-48 hours is important, but occasionally longer periods will be relevant.  6. Middle categories imply that the patient supplies over 50 per cent of the effort.  7. Use of aids to be independent is allowed.    Henriette Desanctis., Barthel, D.W. [phone]). Functional evaluation: the Barthel Index. Md State Med J (14)2.  Fleeta der [place], J.J.M.F, Roland, DAIVD., [place]., [place], [place]. (1999). Measuring the change indisability after inpatient rehabilitation; comparison of the responsiveness of the Barthel Index and Functional Independence Measure. Journal of Neurology, Neurosurgery, and Psychiatry, 66(4), [phone].  Fleeta [place], N.J.A, [place],  W.J.M, & [place]op[place]chap, M.A. (2004.) Assessment of post-stroke quality of life in cost-effectiveness studies: The usefulness of the Barthel Index and the EuroQoL-5D. Quality of Life Research, 40, 572-56         Occupational Therapy Evaluation Charge Determination   History Examination Decision-Making   LOW Complexity : Brief history review  LOW Complexity : 1-3 performance deficits relating to physical, cognitive , or psychosocial skils that result in activity  limitations and / or participation restrictions  LOW Complexity : No comorbidities that affect functional and no verbal or physical assistance needed to complete eval tasks       Based on the above components, the patient evaluation is determined to be of the following complexity level: LOW  Pain Rating:  10+/10 pain reported, unable to specify, RN aware    Activity Tolerance:   Fair  Please refer to the flowsheet for vital signs taken during this treatment.    After treatment patient left in no apparent distress:    Supine in bed, Call bell within reach, and Side rails x 3    COMMUNICATION/EDUCATION:   The patient's plan of care was discussed with: Physical therapist and Registered nurse.     Home safety education was provided and the patient/caregiver indicated understanding., Patient/family have participated as able in goal setting and plan of care., and Patient/family agree to work toward stated goals and plan of care.    This patient's plan of care is appropriate for delegation to OTA.    Thank you for this referral.  Vernell LOISE Ellen, OTD, OTR/L  Time Calculation: 18 mins

## 2019-03-13 NOTE — Progress Notes (Signed)
Progress Notes by Humphrey Rolls, MD at 03/13/19 1800                Author: Humphrey Rolls, MD  Service: Hospitalist  Author Type: Physician       Filed: 03/14/19 0507  Date of Service: 03/13/19 1800  Status: Signed          Editor: Humphrey Rolls, MD (Physician)                    [facility]ist [facility]                                                                                              Hospitalist Progress Note   Humphrey Rolls, MD   Answering service: [phone] OR [phone] from in house phone            Date of Service:  03/14/2019   NAME:  Anita Bell   DOB:  April 06, 1971   MRN:  [id number]           Admission Summary:        Anita Bell is a 48 y.o. female with h/o chronic back pain, ADHD, depression and MS diagnosed 3 years ago presents to the ED complaining of right lower extremity weakness for several  months but worse in the last 2 days. She also report slurred speech this morning but now resolved. She called her neurologist who recommended patient to come to the emergency room for evaluation and possible IV steroids as per her report. Patient has  never followed with a neurologist but was scheduled to see one.  She denies headache, visual disturbance, decreased sensation, SOB, chest pain, nausea, vomiting, abdominal pain or urinary symptoms.  Initial diagnosis of MS with some when she started  experiencing lower extremity weakness.  She states that she had MRI that showed MS (no record available).  Patient also has history of chronic back pain due to mechanical fall on Suboxone.  Patient has been experiencing worsening lower back pain bilaterally.   Pain does not radiate.  She denies any decreased sensation of the lower extremities.  Denies urinary retention or fecal incontinence.  No saddle paresthesia.  No recent trauma or injury.      Interval history / Subjective:        Patient has had numerous complaints throughout the day   Leg pain, nausea,  headache, back pain- wanting meds for symptomatic relief          Assessment & Plan:          1. Lower extremity weakness: unclear etiology   patient started on Methylprednisolone   - Neurology consulted   - MRI of brain was negative   Patient reports longstanding issues with lower back pain/injury   ??   2. Chronic back pain - on Suboxone. Patient requester to restart her Suboxone and gabapentin to be restarted today    cont  high-dose steroids which will help with inflammation in the lower back.     ??   3. Elevated blood pressure: no h/o  hypertension.    Likely triggered by pain   - will monitor, prn antihypertensives   ??   4.  Depression/insomnia:    cont Risperdal and mirtazapine.  .   ??   5.  Anemia:-Check iron panel, B12 and folate for now.   ??   Code status: FULL   DVT prophylaxis: lovenox      Care Plan discussed with: Patient/Family   Anticipated Disposition: Home w/Family   Anticipated Discharge: 24 hours to 48 hours           Hospital Problems   Never Reviewed                         Codes  Class  Noted  POA              Multiple sclerosis exacerbation ([place])  ICD-10-CM: G35   ICD-9-CM: 630    03/12/2019  Unknown                               Review of Systems:     A comprehensive review of systems was negative except for that written in the HPI.            Vital Signs:      Last 24hrs VS reviewed since prior progress note. Most recent are:   Visit Vitals      BP  100/50 (BP 1 Location: Left arm, BP Patient Position: At rest)     Pulse  66     Temp  97.8 ??F (36.6 ??C)     Resp  12     Wt  72.2 kg (159 lb 2.8 oz)     SpO2  97%        BMI  28.20 kg/m??           No intake or output data in the 24 hours ending 03/14/19 0501         Physical Examination:                     Constitutional:   No acute distress, cooperative, pleasant      ENT:   Oral mucosa moist, .      Resp:   CTA bilaterally. No wheezing/rhonchi/rales. No accessory muscle use     CV:   Regular rhythm, normal rate, no murmurs, gallops, rubs       GI:   Soft, non distended, non tender. normoactive bowel sounds, no hepatosplenomegaly       Musculoskeletal:   No edema, warm, 2+ pulses throughout         Neurologic:   mild right arm weakness, mild-moderate bialt leg weakness, decreased sensation of right leg       Psych:  anxious               Data Review:      Review and/or order of clinical lab test   Review and/or order of tests in the radiology section of CPT   Review and/or order of tests in the medicine section of CPT           Labs:          Recent Labs           03/12/19   1541     WBC  5.6     HGB  9.6*     HCT  30.5*  PLT  247          Recent Labs           03/12/19   1541     NA  136     K  3.4*     CL  105     CO2  26     BUN  14     CREA  1.01     GLU  82     CA  8.7        MG  2.1          Recent Labs           03/12/19   1541     SGOT  16     ALT  20     AP  52     TBILI  0.5     TP  7.5     ALB  3.4*        GLOB  4.1*        No results for input(s): INR, PTP, APTT, INREXT in the last 72 hours.    No results for input(s): FE, TIBC, PSAT, FERR in the last 72 hours.    No results found for: FOL, RBCF    No results for input(s): PH, PCO2, PO2 in the last 72 hours.   No results for input(s): CPK, CKNDX, TROIQ in the last 72 hours.      No lab exists for component: CPKMB   No results found for: CHOL, CHOLX, CHLST, CHOLV, HDL, HDLP, LDL, LDLC, DLDLP, TGLX, TRIGL, TRIGP, CHHD, CHHDX   No results found for: [facility]     Lab Results         Component  Value  Date/Time            Color  YELLOW/STRAW  03/13/2019 03:50 AM       Appearance  CLEAR  03/13/2019 03:50 AM       Specific gravity  1.015  03/13/2019 03:50 AM       Specific gravity  1.029  09/14/2009 08:45 PM       pH (UA)  6.5  03/13/2019 03:50 AM       Protein  Negative  03/13/2019 03:50 AM       Glucose  250 (A)  03/13/2019 03:50 AM       Ketone  Negative  03/13/2019 03:50 AM       Bilirubin  Negative  03/13/2019 03:50 AM       Urobilinogen  1.0  03/13/2019 03:50 AM       Nitrites  Negative   03/13/2019 03:50 AM       Leukocyte Esterase  Negative  03/13/2019 03:50 AM       Epithelial cells  10-20  09/14/2009 08:45 PM       Bacteria  1+ (A)  09/14/2009 08:45 PM       WBC  0-4  09/14/2009 08:45 PM            RBC  3-5  09/14/2009 08:45 PM                Medications Reviewed:          Current Facility-Administered Medications          Medication  Dose  Route  Frequency           ?  traMADoL (ULTRAM) tablet 50 mg   50 mg  Oral  Q6H PRN     ?  lidocaine (XYLOCAINE) 4 % cream     Topical  PRN     ?  buprenorphine-naloxone (SUBOXONE) 8-2mg  SL tablet   1 Tab  SubLINGual  TID     ?  ondansetron (ZOFRAN) injection 4 mg   4 mg  IntraVENous  Q4H PRN     ?  risperiDONE (RisperDAL) tablet 1 mg   1 mg  Oral  QHS     ?  meloxicam (MOBIC) tablet 7.5 mg   7.5 mg  Oral  BID WITH MEALS     ?  sodium chloride (NS) flush 5-40 mL   5-40 mL  IntraVENous  Q8H     ?  sodium chloride (NS) flush 5-40 mL   5-40 mL  IntraVENous  PRN     ?  DULoxetine (CYMBALTA) capsule 60 mg   60 mg  Oral  DAILY     ?  famotidine (PEPCID) tablet 20 mg   20 mg  Oral  BID           ?  gabapentin (NEURONTIN) capsule 800 mg   800 mg  Oral  TID        ______________________________________________________________________   EXPECTED LENGTH OF STAY: - - -   ACTUAL LENGTH OF STAY:          2                    Humphrey Rolls, MD

## 2019-03-13 NOTE — Progress Notes (Signed)
Problem: Risk for Spread of Infection  Goal: Prevent transmission of infectious organism to others  Description: Prevent the transmission of infectious organisms to other patients, staff members, and visitors.  Outcome: Progressing Towards Goal     Problem: Pain  Goal: *Control of Pain  Outcome: Progressing Towards Goal     Problem: General Medical Care Plan  Goal: *Vital signs within specified parameters  Outcome: Progressing Towards Goal  Goal: *Labs within defined limits  Outcome: Progressing Towards Goal  Goal: *Skin integrity maintained  Outcome: Progressing Towards Goal     Problem: Falls - Risk of  Goal: *Absence of Falls  Description: Document Schmid Fall Risk and appropriate interventions in the flowsheet.  Outcome: Progressing Towards Goal  Note: Fall Risk Interventions:            Medication Interventions: Bed/chair exit alarm, Evaluate medications/consider consulting pharmacy, Patient to call before getting OOB

## 2019-03-13 NOTE — Progress Notes (Signed)
TOC    Per DC [place] Crane with [facility] they are interested in accepting but need OT evaluation and working on authorization.Dr Johney Maine informed of the need for OT order.

## 2019-03-13 NOTE — Other (Signed)
Occupational Therapy Screening:  Services maybe indicated at this time.    An In[facility]asket screening referral was triggered for occupational therapy based on results obtained during the nursing admission assessment.  The patient???s chart was reviewed .  Please order a consult for occupational therapy if patient has had a decline in function from baseline and you would like an evaluation to be completed.  Thank you.

## 2019-03-13 NOTE — Progress Notes (Signed)
Problem: Self Care Deficits Care Plan (Adult)  Goal: *Acute Goals and Plan of Care (Insert Text)  Description:   FUNCTIONAL STATUS PRIOR TO ADMISSION: Patient was modified independent-intermittent assist using a single point cane for functional mobility. Patient Min-Mod A for basic ADLs, dependent for IADLs, diagnosed with MS 4 months ago,  inconsistent report of symptoms with decreased understanding of dx. Fairly sedentary using urinal vs plastic bag for toileting, has to wait for husband to get home to have BM if able.     HOME SUPPORT: The patient lived with her significant other who works "sometimes," leaving her home alone most days.    Occupational Therapy Goals  Initiated 03/13/2019  1.  Patient will perform grooming at the sink with supervision/set-up within 7 day(s).  2.  Patient will perform lower body dressing with standby assistance within 7 day(s).  3.  Patient will perform bathing with minimal assistance/contact guard assist within 7 day(s).  4.  Patient will perform toilet transfers with supervision/set-up within 7 day(s).  5.  Patient will perform all aspects of toileting with supervision/set-up within 7 day(s).  6.  Patient will participate in upper extremity therapeutic exercise/activities with supervision/set-up for 5 minutes within 7 day(s).    7.  Patient will utilize energy conservation techniques during functional activities with verbal cues within 7 day(s).           Outcome: Not Met     OCCUPATIONAL THERAPY EVALUATION  Patient: Anita Bell (48 y.o. female)  Date: 03/13/2019  Primary Diagnosis: Multiple sclerosis exacerbation ([place]) [G35]        Precautions: Fall    ASSESSMENT  Based on the objective data described below, the patient presents with impaired balance, coordination, sensation, strength, ROM, pain management, insight into deficits, standing tolerance, and problem solving s/p admission for MS exacerbation. PTA, patient requiring intermittent assist  for ADLs and mobility with SPC, inconsistent report provided, lives with significant other who works most days leaving her alone. She now presents well below her baseline, requiring Min-Mod A for ADLs, fair-poor standing balance noted with RW support. Patient reporting global numbness/tingling in BUEs & BLEs (R>L), 10+/10 pain reported, perseverating on needing pain medication. She has decreased sitting balance, donning her socks with a sidelying W sit, completed brief mobility in room with RLE dragging. She is a high fall & safety risk, does not have appropriate supports at home at this time. Recommend d/c to IPR to maximize safety & functional independence.    Current Level of Function Impacting Discharge (ADLs/self-care): Min-Mod A for ADLs, CGA-Min A for mobility with RW    Functional Outcome Measure:  The patient scored Total: 40/100 on the Barthel Index outcome measure which is indicative of 60% impaired ability to care for basic self needs/dependency on others; inferred 100% dependency on others for instrumental ADLs.        Other factors to consider for discharge: fall risk, PMH, PLOF, physical assist for all ADLs & mobility,  poor insight & education of MS diagnosis     Patient will benefit from skilled therapy intervention to address the above noted impairments.       PLAN :  Recommendations and Planned Interventions: self care training, functional mobility training, therapeutic exercise, balance training, therapeutic activities, endurance activities, neuromuscular re-education, patient education, home safety training, and family training/education    Frequency/Duration: Patient will be followed by occupational therapy 5 times a week to address goals.    Recommendation for discharge: (in order for the patient  to meet his/her long term goals)  Therapy 3 hours per day 5-7 days per week    This discharge recommendation:  Has been made in collaboration with the attending provider and/or case management     IF patient discharges home will need the following DME: Gait belt, BSC (to double as shower chair), RW, and 24/7 supervision/physical assist         SUBJECTIVE:   Patient stated ???Why is my right arm hurting? It's not supposed to hurt? Oh no, what is going on?.???    OBJECTIVE DATA SUMMARY:   HISTORY:   Past Medical History:   Diagnosis Date    ADHD     Asthma     Depression     Fibromyalgia     Gastroenteritis     Hydradenitis     suppurativa    Osteoarthritis     Other ill-defined conditions(799.89)     chronic back pain    Psychiatric disorder      Past Surgical History:   Procedure Laterality Date    HX GYN      tubes tided       Expanded or extensive additional review of patient history:     Home Situation  Home Environment: Apartment  One/Two Story Residence: Two story  Living Alone: No  Support Systems: Family member(s)  Patient Expects to be Discharged to:: Apartment  Current DME Used/Available at Home: Cane, straight, IT sales professional Type: Tub/Shower combination    Hand dominance: Right    EXAMINATION OF PERFORMANCE DEFICITS:  Cognitive/Behavioral Status:  Neurologic State: Alert  Orientation Level: Oriented X4  Cognition: Decreased attention/concentration;Follows commands;Poor safety awareness  Perception: Cues to maintain midline in standing(RLE dragging)  Perseveration: Perseverates during conversation(needs pain medication)  Safety/Judgement: Awareness of environment;Decreased awareness of need for assistance;Decreased awareness of need for safety;Decreased insight into deficits    Skin: Appears intact    Edema: None     Hearing:  Auditory  Auditory Impairment: None    Vision/Perceptual:    Tracking: Able to track stimulus in all quadrants w/o difficulty                      Acuity: Within Defined Limits         Range of Motion:  AROM: Generally decreased, functional  PROM: Generally decreased, functional(pain reported with attempted AAROM)                      Strength:   Strength: Generally decreased, functional                Coordination:  Coordination: Generally decreased, functional  Fine Motor Skills-Upper: Left Intact;Right Intact    Gross Motor Skills-Upper: Left Intact;Right Intact    Tone & Sensation:  Tone: Normal  Sensation: Impaired(reports BUE & BLE numbness/tingling R>L)                      Balance:  Sitting: Impaired;Without support  Sitting - Static: Fair (occasional)  Sitting - Dynamic: Fair (occasional)  Standing: Impaired;With support(RW)  Standing - Static: Fair;Constant support  Standing - Dynamic : Fair;Constant support    Functional Mobility and Transfers for ADLs:  Bed Mobility:  Supine to Sit: Contact guard assistance  Sit to Supine: (up in chair)  Scooting: Contact guard assistance    Transfers:  Sit to Stand: Contact guard assistance  Stand to Sit: Contact guard assistance  Bed to Chair:  Minimum assistance  Assistive Device : Walker, rolling    ADL Assessment:  Feeding: Setup    Oral Facial Hygiene/Grooming: Minimum assistance    Bathing: Moderate assistance    Upper Body Dressing: Minimum assistance    Lower Body Dressing: Moderate assistance    Toileting: Moderate assistance                ADL Intervention and task modifications:                           Lower Body Dressing Assistance  Dressing Assistance: Moderate assistance  Pants With Elastic Waist: Moderate assistance(simulated, decr sit & stand balance)  Socks: Contact guard assistance(sidelying to don)  Leg Crossed Method Used: (W sit EOB)  Position Performed: Seated edge of bed(sidelying)  Cues: Physical assistance;Tactile cues provided;Verbal cues provided;Visual cues provided         Cognitive Retraining  Safety/Judgement: Awareness of environment;Decreased awareness of need for assistance;Decreased awareness of need for safety;Decreased insight into deficits    Therapeutic Exercise:  Sit<>stand with CGA-Min A and RW use, brief ambulation around the bed with  Min A for stabilization, RLE dragging     Functional Measure:  Barthel Index:    Bathing: 0  Bladder: 5  Bowels: 5  Grooming: 0  Dressing: 5  Feeding: 10  Mobility: 0  Stairs: 0  Toilet Use: 5  Transfer (Bed to Chair and Back): 10  Total: 40/100        The Barthel ADL Index: Guidelines  1. The index should be used as a record of what a patient does, not as a record of what a patient could do.  2. The main aim is to establish degree of independence from any help, physical or verbal, however minor and for whatever reason.  3. The need for supervision renders the patient not independent.  4. A patient's performance should be established using the best available evidence. Asking the patient, friends/relatives and nurses are the usual sources, but direct observation and common sense are also important. However direct testing is not needed.  5. Usually the patient's performance over the preceding 24-48 hours is important, but occasionally longer periods will be relevant.  6. Middle categories imply that the patient supplies over 50 per cent of the effort.  7. Use of aids to be independent is allowed.    Daneen Schick., Barthel, D.W. [phone]). Functional evaluation: the Barthel Index. [place] (14)2.  Lucianne Lei der [place], J.J.M.F, [place], Diona Browner., Oris Drone., [place], [place] (1999). Measuring the change indisability after inpatient rehabilitation; comparison of the responsiveness of the Barthel Index and Functional Independence Measure. Journal of Neurology, Neurosurgery, and Psychiatry, 66(4), [phone].  Wilford Sports, N.J.A, [place],  W.J.M, & [place], M.A. (2004.) Assessment of post-stroke quality of life in cost-effectiveness studies: The usefulness of the Barthel Index and the EuroQoL-5D. Quality of Life Research, 63, [phone]         Occupational Therapy Evaluation Charge Determination   History Examination Decision-Making   LOW Complexity : Brief history review  LOW Complexity : 1-3 performance  deficits relating to physical, cognitive , or psychosocial skils that result in activity limitations and / or participation restrictions  LOW Complexity : No comorbidities that affect functional and no verbal or physical assistance needed to complete eval tasks       Based on the above components, the patient evaluation is determined to be of the following complexity level: LOW  Pain Rating:  10+/10 pain reported, unable to specify, RN aware    Activity Tolerance:   Fair  Please refer to the flowsheet for vital signs taken during this treatment.    After treatment patient left in no apparent distress:    Supine in bed, Call bell within reach, and Side rails x 3    COMMUNICATION/EDUCATION:   The patient???s plan of care was discussed with: Physical therapist and Registered nurse.     Home safety education was provided and the patient/caregiver indicated understanding., Patient/family have participated as able in goal setting and plan of care., and Patient/family agree to work toward stated goals and plan of care.    This patient???s plan of care is appropriate for delegation to OTA.    Thank you for this referral.  Tawni Millers, OTD, OTR/L  Time Calculation: 18 mins

## 2019-03-13 NOTE — Progress Notes (Signed)
Problem: Mobility Impaired (Adult and Pediatric)  Goal: *Acute Goals and Plan of Care (Insert Text)  Description: FUNCTIONAL STATUS PRIOR TO ADMISSION: Pt has hx of MS, diagnosed a few years ago, reports decline in mobility over last 3 months.  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  Pt lives with her husband in a two story apartment.  She was needing help for all mobility and some ADLs prior to admission.        HOME SUPPORT PRIOR TO ADMISSION: Needs help for all mobility but is home alone most of the day while her husband is at work    Physical Therapy Goals  Initiated 03/13/2019  1.  Patient will move from supine to sit and sit to supine , scoot up and down and roll side to side in bed with modified independence within 7 day(s).    2.  Patient will transfer from bed to chair and chair to bed with modified independence using the least restrictive device within 7 day(s).  3.  Patient will perform sit to stand with modified independence within 7 day(s).  4.  Patient will ambulate with modified independence for 50 feet with the least restrictive device within 7 day(s).   5.  Patient will ascend/descend 12 stairs with single handrail(s) with moderate assistance  within 7 day(s).  Outcome: Progressing Towards Goal  PHYSICAL THERAPY EVALUATION  Patient: Anita Bell (48 y.o. female)  Date: 03/13/2019  Primary Diagnosis: Multiple sclerosis exacerbation ([place]) [G35]        Precautions: Fall         ASSESSMENT  Based on the objective data described below, the patient presents with weakness (R>L), diminished sensation/coordination/AROM R side, impaired balance, RLE pain, poor insight into deficits, unsteady gait, and overall decline in functional mobility.  Per pt, she was diagnosed with MS a few years ago, but has never seen a neurologist.  She noticed decline in functional mobility ~3 months ago.  Since then, she hasn't been able to walk or navigate stairs without assistance from her husband who works  during the day.  Pt also reports hx SCI in 2004.  Pt with difficulty differentiating new vs old symptoms and demonstrates poor insight into her deficits.  This date, she transferred supine to sit with CGA and demonstrated fair sitting balance.  She transferred sit<>stand with CGA and was able to ambulate a short distance using RW with minA.   Pt is a fall risk and presents below her baseline level.  Recommending IP rehab upon discharge.        Current Level of Function Impacting Discharge (mobility/balance): minA for ambulation     Functional Outcome Measure:  The patient scored 4/56 on the Berg outcome measure which is indicative of high fall risk.      Other factors to consider for discharge: hx MS, hx SCI, high fall risk, reports financial difficulties getting equipment      Patient will benefit from skilled therapy intervention to address the above noted impairments.       PLAN :  Recommendations and Planned Interventions: bed mobility training, transfer training, gait training, therapeutic exercises, neuromuscular re-education, patient and family training/education, and therapeutic activities      Frequency/Duration: Patient will be followed by physical therapy:  5 times a week to address goals.    Recommendation for discharge: (in order for the patient to meet his/her long term goals)  Therapy 3 hours per day 5-7 days per week  If pt is to d/c  home, she will require home health PT, RW, gait belt, and BSC    This discharge recommendation:  Has not yet been discussed the attending provider and/or case management    IF patient discharges home will need the following DME: bedside commode, gait belt, and rolling walker         SUBJECTIVE:   Patient stated ???I've just been holding it in until my husband gets home from work and can help me get to the bathroom.???    OBJECTIVE DATA SUMMARY:   HISTORY:    Past Medical History:   Diagnosis Date    ADHD     Asthma     Depression     Fibromyalgia     Gastroenteritis      Hydradenitis     suppurativa    Osteoarthritis     Other ill-defined conditions(799.89)     chronic back pain    Psychiatric disorder      Past Surgical History:   Procedure Laterality Date    HX GYN      tubes tided     Home Situation  Home Environment: Apartment  One/Two Story Residence: Two story  Living Alone: No  Support Systems: Family member(s)  Patient Expects to be Discharged to:: Apartment  Current DME Used/Available at Home: Cane, straight    EXAMINATION/PRESENTATION/DECISION MAKING:   Critical Behavior:  Neurologic State: Alert  Orientation Level: Oriented X4  Cognition: Appropriate decision making, Appropriate for age attention/concentration, Appropriate safety awareness, Follows commands     Hearing:  Auditory  Auditory Impairment: None    Range Of Motion:  AROM: Generally decreased, functional           PROM: Within functional limits           Strength:    Strength: Generally decreased, functional                    Tone & Sensation:   Tone: Normal              Sensation: Impaired(R side diminished )               Coordination:  Coordination: Generally decreased, functional    Functional Mobility:  Bed Mobility:  Supine to Sit: Contact guard assistance  Sit to Supine: (up in chair)  Scooting: Contact guard assistance    Transfers:  Sit to Stand: Contact guard assistance  Stand to Sit: Contact guard assistance  Bed to Chair: Minimum assistance    Balance:   Sitting: Impaired  Sitting - Static: Fair (occasional)  Sitting - Dynamic: Fair (occasional)  Standing: Impaired;With support(RW)  Standing - Static: Fair;Constant support  Standing - Dynamic : Fair;Constant support    Ambulation/Gait Training:  Distance (ft): 25 Feet (ft)  Assistive Device: Gait belt;Walker, rolling  Ambulation - Level of Assistance: Minimal assistance;Assist x1  Gait Abnormalities: Antalgic;Decreased step clearance;Shuffling gait;Trunk sway increased  Base of Support: Narrowed  Stance: Left decreased;Right decreased   Speed/Cadence: Slow;Shuffled  Step Length: Right shortened;Left shortened  Swing Pattern: Left asymmetrical;Right asymmetrical    Functional Measure:  Berg Balance Test:    Sitting to Standing: 1  Standing Unsupported: 0  Sitting with Back Unsupported: 3  Standing to Sitting: 0  Transfers: 0  Standing Unsupported with Eyes Closed: 0  Standing Unsupported with Feet Together: 0  Reach Forward with Outstretched Arm: 0  Pick Up Object: 0  Turn to Look Over Shoulders: 0  Turn 360 Degrees: 0  Alternate Foot  on Step/Stool: 0  Standing Unsupported One Foot in Front: 0  Stand on One Leg: 0  Total: 4/56         56=Maximum possible score;   0-20=High fall risk  21-40=Moderate fall risk   41-56=Low fall risk      Physical Therapy Evaluation Charge Determination   History Examination Presentation Decision-Making   HIGH Complexity :3+ comorbidities / personal factors will impact the outcome/ POC  HIGH Complexity : 4+ Standardized tests and measures addressing body structure, function, activity limitation and / or participation in recreation  MEDIUM Complexity : Evolving with changing characteristics  Other outcome measures Berg  HIGH       Based on the above components, the patient evaluation is determined to be of the following complexity level: MEDIUM    Pain Rating:  Reports pain in R hip and leg     Activity Tolerance:   Fair, fatigued quickly   Please refer to the flowsheet for vital signs taken during this treatment.    After treatment patient left in no apparent distress:   Sitting in chair, Call bell within reach, and Bed / chair alarm activated    COMMUNICATION/EDUCATION:   The patient???s plan of care was discussed with: Registered nurse.     Fall prevention education was provided and the patient/caregiver indicated understanding., Patient/family have participated as able in goal setting and plan of care., and Patient/family agree to work toward stated goals and plan of care.    Thank you for this referral.   Richarda Blade, PT, DPT   Time Calculation: 36 mins

## 2019-03-13 NOTE — Progress Notes (Signed)
[facility]                                                                                          Hospitalist Progress Note  Humphrey Rolls, MD  Answering service: [phone] OR [phone] from in house phone        Date of Service:  03/14/2019  NAME:  Anita Bell  DOB:  Jun 18, 1971  MRN:  [id number]      Admission Summary:   Anita Bell is a 48 y.o. female with h/o chronic back pain, ADHD, depression and MS diagnosed 3 years ago presents to the ED complaining of right lower extremity weakness for several months but worse in the last 2 days. She also report slurred speech this morning but now resolved. She called her neurologist who recommended patient to come to the emergency room for evaluation and possible IV steroids as per her report. Patient has never followed with a neurologist but was scheduled to see one.  She denies headache, visual disturbance, decreased sensation, SOB, chest pain, nausea, vomiting, abdominal pain or urinary symptoms.  Initial diagnosis of MS with some when she started experiencing lower extremity weakness.  She states that she had MRI that showed MS (no record available).  Patient also has history of chronic back pain due to mechanical fall on Suboxone.  Patient has been experiencing worsening lower back pain bilaterally.  Pain does not radiate.  She denies any decreased sensation of the lower extremities.  Denies urinary retention or fecal incontinence.  No saddle paresthesia.  No recent trauma or injury.    Interval history / Subjective:   Patient has had numerous complaints throughout the day  Leg pain, nausea, headache, back pain- wanting meds for symptomatic relief     Assessment & Plan:     1. Lower extremity weakness: unclear etiology  patient started on Methylprednisolone  - Neurology consulted  - MRI of brain was negative  Patient reports longstanding issues with lower back pain/injury  ??   2. Chronic back pain - on Suboxone. Patient requester to restart her Suboxone and gabapentin to be restarted today   cont  high-dose steroids which will help with inflammation in the lower back.    ??  3. Elevated blood pressure: no h/o hypertension.   Likely triggered by pain  - will monitor, prn antihypertensives  ??  4.  Depression/insomnia:   cont Risperdal and mirtazapine.  .  ??  5.  Anemia:-Check iron panel, B12 and folate for now.  ??  Code status: FULL  DVT prophylaxis: lovenox    Care Plan discussed with: Patient/Family  Anticipated Disposition: Home w/Family  Anticipated Discharge: 24 hours to 48 hours     Hospital Problems  Never Reviewed          Codes Class Noted POA    Multiple sclerosis exacerbation ([place]) ICD-10-CM: G35  ICD-9-CM: 267  03/12/2019 Unknown                Review of Systems:   A comprehensive review of systems was negative except for that written in the HPI.  Vital Signs:    Last 24hrs VS reviewed since prior progress note. Most recent are:  Visit Vitals  BP 100/50 (BP 1 Location: Left arm, BP Patient Position: At rest)   Pulse 66   Temp 97.8 ??F (36.6 ??C)   Resp 12   Wt 72.2 kg (159 lb 2.8 oz)   SpO2 97%   BMI 28.20 kg/m??       No intake or output data in the 24 hours ending 03/14/19 0501     Physical Examination:             Constitutional:  No acute distress, cooperative, pleasant    ENT:  Oral mucosa moist, .    Resp:  CTA bilaterally. No wheezing/rhonchi/rales. No accessory muscle use   CV:  Regular rhythm, normal rate, no murmurs, gallops, rubs    GI:  Soft, non distended, non tender. normoactive bowel sounds, no hepatosplenomegaly     Musculoskeletal:  No edema, warm, 2+ pulses throughout    Neurologic:  mild right arm weakness, mild-moderate bialt leg weakness, decreased sensation of right leg     Psych:  anxious       Data Review:    Review and/or order of clinical lab test  Review and/or order of tests in the radiology section of CPT   Review and/or order of tests in the medicine section of CPT      Labs:     Recent Labs     03/12/19  1541   WBC 5.6   HGB 9.6*   HCT 30.5*   PLT 247     Recent Labs     03/12/19  1541   NA 136   K 3.4*   CL 105   CO2 26   BUN 14   CREA 1.01   GLU 82   CA 8.7   MG 2.1     Recent Labs     03/12/19  1541   SGOT 16   ALT 20   AP 52   TBILI 0.5   TP 7.5   ALB 3.4*   GLOB 4.1*     No results for input(s): INR, PTP, APTT, INREXT in the last 72 hours.   No results for input(s): FE, TIBC, PSAT, FERR in the last 72 hours.   No results found for: FOL, RBCF   No results for input(s): PH, PCO2, PO2 in the last 72 hours.  No results for input(s): CPK, CKNDX, TROIQ in the last 72 hours.    No lab exists for component: CPKMB  No results found for: CHOL, CHOLX, CHLST, CHOLV, HDL, HDLP, LDL, LDLC, DLDLP, TGLX, TRIGL, TRIGP, CHHD, CHHDX  No results found for: [facility]  Lab Results   Component Value Date/Time    Color YELLOW/STRAW 03/13/2019 03:50 AM    Appearance CLEAR 03/13/2019 03:50 AM    Specific gravity 1.015 03/13/2019 03:50 AM    Specific gravity 1.029 09/14/2009 08:45 PM    pH (UA) 6.5 03/13/2019 03:50 AM    Protein Negative 03/13/2019 03:50 AM    Glucose 250 (A) 03/13/2019 03:50 AM    Ketone Negative 03/13/2019 03:50 AM    Bilirubin Negative 03/13/2019 03:50 AM    Urobilinogen 1.0 03/13/2019 03:50 AM    Nitrites Negative 03/13/2019 03:50 AM    Leukocyte Esterase Negative 03/13/2019 03:50 AM    Epithelial cells 10-20 09/14/2009 08:45 PM    Bacteria 1+ (A) 09/14/2009 08:45 PM    WBC 0-4 09/14/2009 08:45 PM  RBC 3-5 09/14/2009 08:45 PM         Medications Reviewed:     Current Facility-Administered Medications   Medication Dose Route Frequency   ??? traMADoL (ULTRAM) tablet 50 mg  50 mg Oral Q6H PRN   ??? lidocaine (XYLOCAINE) 4 % cream   Topical PRN   ??? buprenorphine-naloxone (SUBOXONE) 8-2mg  SL tablet  1 Tab SubLINGual TID   ??? ondansetron (ZOFRAN) injection 4 mg  4 mg IntraVENous Q4H PRN    ??? risperiDONE (RisperDAL) tablet 1 mg  1 mg Oral QHS   ??? meloxicam (MOBIC) tablet 7.5 mg  7.5 mg Oral BID WITH MEALS   ??? sodium chloride (NS) flush 5-40 mL  5-40 mL IntraVENous Q8H   ??? sodium chloride (NS) flush 5-40 mL  5-40 mL IntraVENous PRN   ??? DULoxetine (CYMBALTA) capsule 60 mg  60 mg Oral DAILY   ??? famotidine (PEPCID) tablet 20 mg  20 mg Oral BID   ??? gabapentin (NEURONTIN) capsule 800 mg  800 mg Oral TID     ______________________________________________________________________  EXPECTED LENGTH OF STAY: - - -  ACTUAL LENGTH OF STAY:          2                 Humphrey Rolls, MD

## 2019-03-14 ENCOUNTER — Inpatient Hospital Stay: Admit: 2019-03-14 | Payer: PRIVATE HEALTH INSURANCE

## 2019-03-14 LAB — SEDIMENTATION RATE: Sed Rate: 31 mm/hr — ABNORMAL HIGH (ref 0–20)

## 2019-03-14 LAB — BASIC METABOLIC PANEL
Anion Gap: 6 mmol/L (ref 5–15)
BUN: 16 MG/DL (ref 6–20)
Bun/Cre Ratio: 16 (ref 12–20)
CO2: 25 mmol/L (ref 21–32)
Calcium: 8.7 MG/DL (ref 8.5–10.1)
Chloride: 107 mmol/L (ref 97–108)
Creatinine: 0.97 MG/DL (ref 0.55–1.02)
EGFR IF NonAfrican American: >60 mL/min/{1.73_m2} (ref >60)
GFR African American: >60 mL/min/{1.73_m2} (ref >60)
Glucose: 115 mg/dL — ABNORMAL HIGH (ref 65–100)
Potassium: 3.6 mmol/L (ref 3.5–5.1)
Sodium: 138 mmol/L (ref 136–145)

## 2019-03-14 LAB — IRON AND TIBC
Iron Saturation: 21 % (ref 20–50)
Iron: 53 ug/dL (ref 35–150)
TIBC: 257 ug/dL (ref 250–450)

## 2019-03-14 LAB — FOLATE
Folate: 5.4 ng/mL (ref 5.0–21.0)
Folate: 5.4 ng/mL (ref 5.0–21.0)

## 2019-03-14 LAB — POCT GLUCOSE: POC Glucose: 144 mg/dL — ABNORMAL HIGH (ref 65–100)

## 2019-03-14 LAB — VITAMIN B12
Vitamin B-12: 316 pg/mL (ref 193–986)
Vitamin B12: 316 pg/mL (ref 193–986)

## 2019-03-14 LAB — SED RATE (ESR): Sed rate, automated: 31 mm/hr — ABNORMAL HIGH (ref 0–20)

## 2019-03-14 LAB — METABOLIC PANEL, BASIC
Anion gap: 6 mmol/L (ref 5–15)
BUN/Creatinine ratio: 16 (ref 12–20)
BUN: 16 MG/DL (ref 6–20)
CO2: 25 mmol/L (ref 21–32)
Calcium: 8.7 MG/DL (ref 8.5–10.1)
Chloride: 107 mmol/L (ref 97–108)
Creatinine: 0.97 MG/DL (ref 0.55–1.02)
GFR est AA: >60 mL/min/{1.73_m2} (ref >60)
GFR est non-AA: >60 mL/min/{1.73_m2} (ref >60)
Glucose: 115 mg/dL — ABNORMAL HIGH (ref 65–100)
Potassium: 3.6 mmol/L (ref 3.5–5.1)
Sodium: 138 mmol/L (ref 136–145)

## 2019-03-14 LAB — IRON PROFILE
Iron % saturation: 21 % (ref 20–50)
Iron: 53 ug/dL (ref 35–150)
TIBC: 257 ug/dL (ref 250–450)

## 2019-03-14 LAB — GLUCOSE, POC: Glucose (POC): 144 mg/dL — ABNORMAL HIGH (ref 65–100)

## 2019-03-14 MED FILL — GABAPENTIN 400 MG CAP: 400 mg | ORAL | Qty: 2

## 2019-03-14 MED FILL — BUPRENORPHINE-NALOXONE 8 MG-2 MG SUBLINGUAL TAB: 8-2 mg | SUBLINGUAL | Qty: 1

## 2019-03-14 MED FILL — FAMOTIDINE 20 MG TAB: 20 mg | ORAL | Qty: 1

## 2019-03-14 MED FILL — RISPERIDONE 1 MG TAB: 1 mg | ORAL | Qty: 1

## 2019-03-14 MED FILL — NORMAL SALINE FLUSH 0.9 % INJECTION SYRINGE: INTRAMUSCULAR | Qty: 10

## 2019-03-14 MED FILL — MELOXICAM 7.5 MG TAB: 7.5 mg | ORAL | Qty: 1

## 2019-03-14 MED FILL — DULOXETINE 60 MG CAP, DELAYED RELEASE: 60 mg | ORAL | Qty: 1

## 2019-03-14 NOTE — Consults (Signed)
Neuro consult completed. Dictated note to follow. Pt reports a diagnosis of MS for which she is trying to get disability, yet has a normal MRI brain and does not have a neurologist.  She also reports a h/o "spinal cord injury" in 2004 and yet has no evidence of myelopathy on exam. 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  MRI brain is normal.  Exam riddled with non-physiological findings.  Agree with MRI C and L-spine.

## 2019-03-14 NOTE — Progress Notes (Signed)
Bedside and Verbal shift change report given to Evelena Peat, Therapist, sports (oncoming nurse) by Blanch Media, RN (offgoing nurse). Report included the following information SBAR, Kardex, MAR, Recent Results and Cardiac Rhythm NSR.

## 2019-03-14 NOTE — Progress Notes (Signed)
Progress Notes by Humphrey Rolls, MD at 03/14/19 [place]                Author: Humphrey Rolls, MD  Service: Hospitalist  Author Type: Physician       Filed: 03/14/19 1329  Date of Service: 03/14/19 1325  Status: Signed          Editor: Humphrey Rolls, MD (Physician)                    [facility]                                                                                              Hospitalist Progress Note   Humphrey Rolls, MD   Answering service: [phone] OR [phone] from in house phone            Date of Service:  03/14/2019   NAME:  Anita Bell   DOB:  [birth year]   MRN:  [id number]           Admission Summary:        Anita Bell is a 48 y.o. female with h/o chronic back pain, ADHD, depression and MS diagnosed 3 years ago presents to the ED complaining of right lower extremity weakness for several  months but worse in the last 2 days. She also report slurred speech this morning but now resolved. She called her neurologist who recommended patient to come to the emergency room for evaluation and possible IV steroids as per her report. Patient has  never followed with a neurologist but was scheduled to see one.  She denies headache, visual disturbance, decreased sensation, SOB, chest pain, nausea, vomiting, abdominal pain or urinary symptoms.  Initial diagnosis of MS with some when she started  experiencing lower extremity weakness.  She states that she had MRI that showed MS (no record available).  Patient also has history of chronic back pain due to mechanical fall on Suboxone.  Patient has been experiencing worsening lower back pain bilaterally.   Pain does not radiate.  She denies any decreased sensation of the lower extremities.  Denies urinary retention or fecal incontinence.  No saddle paresthesia.  No recent trauma or injury.      Interval history / Subjective:        Patient has had numerous complaints yesterday throughout the day   Leg pain,  nausea, headache, back pain- wanting meds for symptomatic relief   Today she is lethargic and not very interactive          Assessment & Plan:          1. Lower extremity weakness: unclear etiology   patient given one dose Methylprednisolone 1000mg  on admission   - Neurology consulted   - MRI of brain was negative   Patient reports longstanding issues with lower back pain/injury   MRI of cervical and lumbar spine today   ??   2. Chronic back pain - on Suboxone. Patient requester to restart her Suboxone and gabapentin to be restarted yesterday    Heating pad,  MRI of C and L spine ordered     ??   3. Elevated blood pressure: no h/o hypertension.    Likely triggered by pain   - will monitor, prn antihypertensives   ??   4.  Depression/insomnia:    cont Risperdal and mirtazapine.  .   ??   5.  Anemia:-Checked iron panel, B12 and folate - all with within the normal range but folate and B12 low normal. She will need GI eval and GYN eval outpatient for further anemia workup   ??   Code status: FULL   DVT prophylaxis: lovenox      Care Plan discussed with: Patient/Family   Anticipated Disposition: Home w/Family   Anticipated Discharge: 24 hours to 48 hours           Hospital Problems   Never Reviewed                         Codes  Class  Noted  POA              Multiple sclerosis exacerbation ([place])  ICD-10-CM: G35   ICD-9-CM: 427    03/12/2019  Unknown                               Review of Systems:     A comprehensive review of systems was negative except for that written in the HPI.            Vital Signs:      Last 24hrs VS reviewed since prior progress note. Most recent are:   Visit Vitals      BP  112/68     Pulse  68     Temp  97.8 ??F (36.6 ??C)     Resp  12     Wt  72.2 kg (159 lb 2.8 oz)     SpO2  98%        BMI  28.20 kg/m??           No intake or output data in the 24 hours ending 03/14/19 1325         Physical Examination:                     Constitutional:   No acute distress, cooperative, pleasant      ENT:   Oral  mucosa moist, .      Resp:   CTA bilaterally. No wheezing/rhonchi/rales. No accessory muscle use     CV:   Regular rhythm, normal rate, no murmurs, gallops, rubs      GI:   Soft, non distended, non tender. normoactive bowel sounds, no hepatosplenomegaly       Musculoskeletal:   No edema, warm, 2+ pulses throughout         Neurologic:   mild right arm weakness, mild-moderate bialt leg weakness (R>L) , decreased sensation of right leg       Psych:  Flat affect               Data Review:      Review and/or order of clinical lab test   Review and/or order of tests in the radiology section of CPT   Review and/or order of tests in the medicine section of CPT           Labs:          Recent  Labs           03/12/19   1541     WBC  5.6     HGB  9.6*     HCT  30.5*        PLT  247          Recent Labs            03/14/19   0547  03/12/19   1541     NA  138  136     K  3.6  3.4*     CL  107  105     CO2  25  26     BUN  16  14     CREA  0.97  1.01     GLU  115*  82     CA  8.7  8.7         MG   --   2.1          Recent Labs           03/12/19   1541     SGOT  16     ALT  20     AP  52     TBILI  0.5     TP  7.5     ALB  3.4*        GLOB  4.1*        No results for input(s): INR, PTP, APTT, INREXT, INREXT in the last 72 hours.      Recent Labs           03/14/19   0547     TIBC  257        PSAT  21           Lab Results         Component  Value  Date/Time            Folate  5.4  03/14/2019 05:47 AM         No results for input(s): PH, PCO2, PO2 in the last 72 hours.   No results for input(s): CPK, CKNDX, TROIQ in the last 72 hours.      No lab exists for component: CPKMB   No results found for: CHOL, CHOLX, CHLST, CHOLV, HDL, HDLP, LDL, LDLC, DLDLP, TGLX, TRIGL, TRIGP, CHHD, CHHDX     Lab Results         Component  Value  Date/Time            Glucose (POC)  144 (H)  03/14/2019 09:56 AM          Lab Results         Component  Value  Date/Time            Color  YELLOW/STRAW  03/13/2019 03:50 AM       Appearance  CLEAR  03/13/2019  03:50 AM       Specific gravity  1.015  03/13/2019 03:50 AM       Specific gravity  1.029  09/14/2009 08:45 PM       pH (UA)  6.5  03/13/2019 03:50 AM       Protein  Negative  03/13/2019 03:50 AM       Glucose  250 (A)  03/13/2019 03:50 AM       Ketone  Negative  03/13/2019 03:50 AM       Bilirubin  Negative  03/13/2019 03:50 AM  Urobilinogen  1.0  03/13/2019 03:50 AM       Nitrites  Negative  03/13/2019 03:50 AM       Leukocyte Esterase  Negative  03/13/2019 03:50 AM       Epithelial cells  10-20  09/14/2009 08:45 PM       Bacteria  1+ (A)  09/14/2009 08:45 PM       WBC  0-4  09/14/2009 08:45 PM            RBC  3-5  09/14/2009 08:45 PM                Medications Reviewed:          Current Facility-Administered Medications          Medication  Dose  Route  Frequency           ?  traMADoL (ULTRAM) tablet 50 mg   50 mg  Oral  Q6H PRN     ?  lidocaine (XYLOCAINE) 4 % cream     Topical  PRN     ?  buprenorphine-naloxone (SUBOXONE) 8-2mg  SL tablet   1 Tab  SubLINGual  TID     ?  ondansetron (ZOFRAN) injection 4 mg   4 mg  IntraVENous  Q4H PRN     ?  risperiDONE (RisperDAL) tablet 1 mg   1 mg  Oral  QHS     ?  meloxicam (MOBIC) tablet 7.5 mg   7.5 mg  Oral  BID WITH MEALS     ?  sodium chloride (NS) flush 5-40 mL   5-40 mL  IntraVENous  Q8H     ?  sodium chloride (NS) flush 5-40 mL   5-40 mL  IntraVENous  PRN     ?  DULoxetine (CYMBALTA) capsule 60 mg   60 mg  Oral  DAILY     ?  famotidine (PEPCID) tablet 20 mg   20 mg  Oral  BID           ?  gabapentin (NEURONTIN) capsule 800 mg   800 mg  Oral  TID        ______________________________________________________________________   EXPECTED LENGTH OF STAY: - - -   ACTUAL LENGTH OF STAY:          2                    Humphrey Rolls, MD

## 2019-03-14 NOTE — Progress Notes (Signed)
Problem: Pain  Goal: *Control of Pain  Outcome: Progressing Towards Goal     Problem: General Medical Care Plan  Goal: *Optimal pain control at patient's stated goal  Outcome: Progressing Towards Goal  Goal: *Anxiety reduced or absent  Outcome: Progressing Towards Goal     Problem: Falls - Risk of  Goal: *Absence of Falls  Description: Document Schmid Fall Risk and appropriate interventions in the flowsheet.  Outcome: Progressing Towards Goal  Note: Fall Risk Interventions:  Mobility Interventions: Communicate number of staff needed for ambulation/transfer, OT consult for ADLs, Patient to call before getting OOB, PT Consult for mobility concerns, PT Consult for assist device competence, Strengthening exercises (ROM-active/passive), Utilize walker, cane, or other assistive device         Medication Interventions: Patient to call before getting OOB, Teach patient to arise slowly

## 2019-03-14 NOTE — Progress Notes (Signed)
Problem: Risk for Spread of Infection  Goal: Prevent transmission of infectious organism to others  Description: Prevent the transmission of infectious organisms to other patients, staff members, and visitors.  Outcome: Progressing Towards Goal     Problem: Patient Education:  Go to Education Activity  Goal: Patient/Family Education  Outcome: Progressing Towards Goal     Problem: Pain  Goal: *Control of Pain  Outcome: Progressing Towards Goal  Goal: *PALLIATIVE CARE:  Alleviation of Pain  Outcome: Progressing Towards Goal     Problem: General Medical Care Plan  Goal: *Vital signs within specified parameters  Outcome: Progressing Towards Goal  Goal: *Labs within defined limits  Outcome: Progressing Towards Goal  Goal: *Absence of infection signs and symptoms  Outcome: Progressing Towards Goal  Goal: *Optimal pain control at patient's stated goal  Outcome: Progressing Towards Goal  Goal: *Skin integrity maintained  Outcome: Progressing Towards Goal  Goal: *Fluid volume balance  Outcome: Progressing Towards Goal  Goal: *Optimize nutritional status  Outcome: Progressing Towards Goal  Goal: *Anxiety reduced or absent  Outcome: Progressing Towards Goal  Goal: *Progressive mobility and function (eg: ADL's)  Outcome: Progressing Towards Goal     Problem: Falls - Risk of  Goal: *Absence of Falls  Description: Document Schmid Fall Risk and appropriate interventions in the flowsheet.  Outcome: Progressing Towards Goal  Note: Fall Risk Interventions:  Mobility Interventions: Communicate number of staff needed for ambulation/transfer, OT consult for ADLs, Patient to call before getting OOB, PT Consult for mobility concerns, PT Consult for assist device competence, Strengthening exercises (ROM-active/passive), Utilize walker, cane, or other assistive device         Medication Interventions: Patient to call before getting OOB, Teach patient to arise slowly                   Problem: Patient Education: Go to Patient Education  Activity  Goal: Patient/Family Education  Outcome: Progressing Towards Goal     Problem: Pressure Injury - Risk of  Goal: *Prevention of pressure injury  Description: Document Braden Scale and appropriate interventions in the flowsheet.  Outcome: Progressing Towards Goal  Note: Pressure Injury Interventions:  Sensory Interventions: Assess changes in LOC, Discuss PT/OT consult with provider, Float heels, Keep linens dry and wrinkle-free, Maintain/enhance activity level, Minimize linen layers         Activity Interventions: Increase time out of bed, Pressure redistribution bed/mattress(bed type), PT/OT evaluation    Mobility Interventions: HOB 30 degrees or less, Pressure redistribution bed/mattress (bed type), PT/OT evaluation    Nutrition Interventions: Document food/fluid/supplement intake, Offer support with meals,snacks and hydration                     Problem: Patient Education: Go to Patient Education Activity  Goal: Patient/Family Education  Outcome: Progressing Towards Goal

## 2019-03-14 NOTE — Consults (Signed)
[place]    Name:  RICHA, SHOR  MR#:  [id number]  DOB:  1970/10/30  ACCOUNT #:  [id number]  DATE OF SERVICE:  03/14/2019    NEUROLOGY CONSULTATION    HISTORY OF PRESENT ILLNESS:  A 79 right-handed female, who was admitted on 03/12/19 with complaints of right lower extremity weakness for several months, but worse in the previous 2 days, and slurred speech on the day of presentation.  The patient reports a history of multiple sclerosis, but it is not clear how this diagnosis was made and the patient has never followed up with a neurologist.  She reportedly has an upcoming appointment with a neurologist at [facility], called there on the day of admission, and they suggested she go to the emergency department.  The patient reports that she has chronic right lower extremity weakness, but it was worse in the last 4 months, and she has had right arm pain, numbness, and tingling for the last 2 months.  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  The patient also notes she has a history of "a spinal cord injury" after a fall while running through a field, landing on her back.  The patient has chronic low back pain not radiating to her legs since this fall in 05/2003, and is on Suboxone, Cymbalta, gabapentin, Remeron, and risperidone.  Her MRI of the brain wo contrast is already completed and is reviewed in PACS, there are no white matter lesions, this is a completely normal MRI of the brain.  MRI of the cervical and lumbar spine have been ordered and are pending.  Her lab evaluation included an ESR of 31, a B12 of 316.  A UA that is normal.  Metabolic panel with a slightly low potassium of 3.4, magnesium normal.  CBC with a hemoglobin of 9.6, otherwise unremarkable.    PAST MEDICAL HISTORY:  ADHD, asthma, fibromyalgia, depression,  hidradenitis suppurativa, osteoarthritis, chronic low back pain after a fall in 2004, bilateral tubal ligation.    REVIEW OF SYSTEMS:  As per past medical history and HPI, otherwise reviewed and negative.    MEDICATIONS AT HOME:  Suboxone, Cymbalta, Pepcid, gabapentin, Remeron, naproxen, risperidone.  She has been started on tramadol here.    ALLERGIES:  ASPIRIN AND IBUPROFEN.    SOCIAL HISTORY:  She lives in [place].  She is not working.  She is trying to get disability for her multiple sclerosis and spinal cord injury.  She denies any alcohol or tobacco use.  She previously used recreational drugs, when asked when the last time was, she stated, "let's just say its still a challenge."    FAMILY HISTORY:  No neurological diseases she is aware of, except a grandmother with stroke.    PHYSICAL EXAMINATION:  VITAL SIGNS:  Blood pressure is 117/66, pulse 74, respiratory rate 14, satting 100% on room air, temperature is 97.8.  BMI of 28.2.  GENERAL:  She is a well-nourished, well-developed, healthy-appearing female, sitting on the side of the bed, initially in no distress.  HEART:  Regular rate and rhythm without murmurs, gallops, or rubs.  Her carotids are 2+.  No bruits.  EXTREMITIES:  Warm, without clubbing, cyanosis, or edema.  NEUROLOGIC/MENTAL STATUS:  Alert, oriented x4.  Speech, language intact.  Attention, memory, and fund of knowledge appropriate.  Cranial nerve exam shows no  facial asymmetry or ptosis.  Her extraocular eye movements were intact without diplopia or nystagmus.  Her visual fields were full.  Pupils equally round and reactive.  Her tongue is midline and palate elevates symmetrically.  Strength, sensation, and hearing intact.  Trapezius 5/5 bilaterally.  With sternocleidomastoid testing, with her head turned to the right, she had giveaway weakness.  With her head to the left, she had 5/5 strength.  Her motor examination, the patient had  give-way weakness with right upper extremity testing, with encouragement, manifested 5/5 strength throughout, did not have any pronator drift.  No tremor.  Lower extremities with positive Hoover's on the right.  Sensory exam: reported decreased to pinprick in the right arm, intact in the leg.  Reflexes were symmetric, toes were downgoing.  Coordination was intact to finger-to-nose, heel-to-shin, RAM. Gait: Not assessed.    STUDIES AND REPORTS:  Reviewed above in the HPI.    ASSESSMENT AND PLAN:  This is a 48 year old right-handed female with reported history of multiple sclerosis and spinal cord injury, but who has a normal MRI of the brain without a single white matter lesion and no evidence of myelopathy on exam to suggest spinal cord injury, with non-physiological findings on exam making accurate evaluation of her strength difficult.  I agree with an MRI of the cervical spine and L-spine to completely exclude demyelinating disease or other structural etiology for her complaints.  If these are unremarkable, she would be stable for discharge from a neurologic standpoint.      Chauncey Reading, MD      [id number]  D:  03/14/2019 21:56  T:  03/15/2019 2:47  JOB #:  [id number]

## 2019-03-14 NOTE — Progress Notes (Signed)
[facility]                                                                                          Hospitalist Progress Note  Humphrey Rolls, MD  Answering service: [phone] OR [phone] from in house phone        Date of Service:  03/14/2019  NAME:  Anita Bell  DOB:  Jul 03, 1971  MRN:  [id number]      Admission Summary:   Anita Bell is a 48 y.o. female with h/o chronic back pain, ADHD, depression and MS diagnosed 3 years ago presents to the ED complaining of right lower extremity weakness for several months but worse in the last 2 days. She also report slurred speech this morning but now resolved. She called her neurologist who recommended patient to come to the emergency room for evaluation and possible IV steroids as per her report. Patient has never followed with a neurologist but was scheduled to see one.  She denies headache, visual disturbance, decreased sensation, SOB, chest pain, nausea, vomiting, abdominal pain or urinary symptoms.  Initial diagnosis of MS with some when she started experiencing lower extremity weakness.  She states that she had MRI that showed MS (no record available).  Patient also has history of chronic back pain due to mechanical fall on Suboxone.  Patient has been experiencing worsening lower back pain bilaterally.  Pain does not radiate.  She denies any decreased sensation of the lower extremities.  Denies urinary retention or fecal incontinence.  No saddle paresthesia.  No recent trauma or injury.    Interval history / Subjective:   Patient has had numerous complaints yesterday throughout the day  Leg pain, nausea, headache, back pain- wanting meds for symptomatic relief  Today she is lethargic and not very interactive     Assessment & Plan:     1. Lower extremity weakness: unclear etiology  patient given one dose Methylprednisolone 1000mg  on admission  - Neurology consulted  - MRI of brain was negative   Patient reports longstanding issues with lower back pain/injury  MRI of cervical and lumbar spine today  ??  2. Chronic back pain - on Suboxone. Patient requester to restart her Suboxone and gabapentin to be restarted yesterday   Heating pad, MRI of C and L spine ordered    ??  3. Elevated blood pressure: no h/o hypertension.   Likely triggered by pain  - will monitor, prn antihypertensives  ??  4.  Depression/insomnia:   cont Risperdal and mirtazapine.  .  ??  5.  Anemia:-Checked iron panel, B12 and folate - all with within the normal range but folate and B12 low normal. She will need GI eval and GYN eval outpatient for further anemia workup  ??  Code status: FULL  DVT prophylaxis: lovenox    Care Plan discussed with: Patient/Family  Anticipated Disposition: Home w/Family  Anticipated Discharge: 24 hours to 48 hours     Hospital Problems  Never Reviewed          Codes Class Noted POA    Multiple sclerosis exacerbation ([place]) ICD-10-CM:  G35  ICD-9-CM: 644  03/12/2019 Unknown                Review of Systems:   A comprehensive review of systems was negative except for that written in the HPI.       Vital Signs:    Last 24hrs VS reviewed since prior progress note. Most recent are:  Visit Vitals  BP 112/68   Pulse 68   Temp 97.8 ??F (36.6 ??C)   Resp 12   Wt 72.2 kg (159 lb 2.8 oz)   SpO2 98%   BMI 28.20 kg/m??       No intake or output data in the 24 hours ending 03/14/19 1325     Physical Examination:             Constitutional:  No acute distress, cooperative, pleasant    ENT:  Oral mucosa moist, .    Resp:  CTA bilaterally. No wheezing/rhonchi/rales. No accessory muscle use   CV:  Regular rhythm, normal rate, no murmurs, gallops, rubs    GI:  Soft, non distended, non tender. normoactive bowel sounds, no hepatosplenomegaly     Musculoskeletal:  No edema, warm, 2+ pulses throughout    Neurologic:  mild right arm weakness, mild-moderate bialt leg weakness (R>L) , decreased sensation of right leg     Psych:  Flat affect        Data Review:    Review and/or order of clinical lab test  Review and/or order of tests in the radiology section of CPT  Review and/or order of tests in the medicine section of CPT      Labs:     Recent Labs     03/12/19  1541   WBC 5.6   HGB 9.6*   HCT 30.5*   PLT 247     Recent Labs     03/14/19  0547 03/12/19  1541   NA 138 136   K 3.6 3.4*   CL 107 105   CO2 25 26   BUN 16 14   CREA 0.97 1.01   GLU 115* 82   CA 8.7 8.7   MG  --  2.1     Recent Labs     03/12/19  1541   SGOT 16   ALT 20   AP 52   TBILI 0.5   TP 7.5   ALB 3.4*   GLOB 4.1*     No results for input(s): INR, PTP, APTT, INREXT, INREXT in the last 72 hours.   Recent Labs     03/14/19  0547   TIBC 257   PSAT 21      Lab Results   Component Value Date/Time    Folate 5.4 03/14/2019 05:47 AM      No results for input(s): PH, PCO2, PO2 in the last 72 hours.  No results for input(s): CPK, CKNDX, TROIQ in the last 72 hours.    No lab exists for component: CPKMB  No results found for: CHOL, CHOLX, CHLST, CHOLV, HDL, HDLP, LDL, LDLC, DLDLP, TGLX, TRIGL, TRIGP, CHHD, CHHDX  Lab Results   Component Value Date/Time    Glucose (POC) 144 (H) 03/14/2019 09:56 AM     Lab Results   Component Value Date/Time    Color YELLOW/STRAW 03/13/2019 03:50 AM    Appearance CLEAR 03/13/2019 03:50 AM    Specific gravity 1.015 03/13/2019 03:50 AM    Specific gravity 1.029 09/14/2009 08:45 PM    pH (UA) 6.5  03/13/2019 03:50 AM    Protein Negative 03/13/2019 03:50 AM    Glucose 250 (A) 03/13/2019 03:50 AM    Ketone Negative 03/13/2019 03:50 AM    Bilirubin Negative 03/13/2019 03:50 AM    Urobilinogen 1.0 03/13/2019 03:50 AM    Nitrites Negative 03/13/2019 03:50 AM    Leukocyte Esterase Negative 03/13/2019 03:50 AM    Epithelial cells 10-20 09/14/2009 08:45 PM    Bacteria 1+ (A) 09/14/2009 08:45 PM    WBC 0-4 09/14/2009 08:45 PM    RBC 3-5 09/14/2009 08:45 PM         Medications Reviewed:     Current Facility-Administered Medications   Medication Dose Route Frequency    ??? traMADoL (ULTRAM) tablet 50 mg  50 mg Oral Q6H PRN   ??? lidocaine (XYLOCAINE) 4 % cream   Topical PRN   ??? buprenorphine-naloxone (SUBOXONE) 8-2mg  SL tablet  1 Tab SubLINGual TID   ??? ondansetron (ZOFRAN) injection 4 mg  4 mg IntraVENous Q4H PRN   ??? risperiDONE (RisperDAL) tablet 1 mg  1 mg Oral QHS   ??? meloxicam (MOBIC) tablet 7.5 mg  7.5 mg Oral BID WITH MEALS   ??? sodium chloride (NS) flush 5-40 mL  5-40 mL IntraVENous Q8H   ??? sodium chloride (NS) flush 5-40 mL  5-40 mL IntraVENous PRN   ??? DULoxetine (CYMBALTA) capsule 60 mg  60 mg Oral DAILY   ??? famotidine (PEPCID) tablet 20 mg  20 mg Oral BID   ??? gabapentin (NEURONTIN) capsule 800 mg  800 mg Oral TID     ______________________________________________________________________  EXPECTED LENGTH OF STAY: - - -  ACTUAL LENGTH OF STAY:          2                 Humphrey Rolls, MD

## 2019-03-14 NOTE — Progress Notes (Signed)
Problem: Risk for Spread of Infection  Goal: Prevent transmission of infectious organism to others  Description: Prevent the transmission of infectious organisms to other patients, staff members, and visitors.  Outcome: Progressing Towards Goal     Problem: Patient Education:  Go to Education Activity  Goal: Patient/Family Education  Outcome: Progressing Towards Goal     Problem: Pain  Goal: *Control of Pain  Outcome: Progressing Towards Goal  Goal: *PALLIATIVE CARE:  Alleviation of Pain  Outcome: Progressing Towards Goal     Problem: General Medical Care Plan  Goal: *Vital signs within specified parameters  Outcome: Progressing Towards Goal  Goal: *Labs within defined limits  Outcome: Progressing Towards Goal  Goal: *Absence of infection signs and symptoms  Outcome: Progressing Towards Goal  Goal: *Optimal pain control at patient's stated goal  Outcome: Progressing Towards Goal  Goal: *Skin integrity maintained  Outcome: Progressing Towards Goal  Goal: *Fluid volume balance  Outcome: Progressing Towards Goal  Goal: *Optimize nutritional status  Outcome: Progressing Towards Goal  Goal: *Anxiety reduced or absent  Outcome: Progressing Towards Goal  Goal: *Progressive mobility and function (eg: ADL's)  Outcome: Progressing Towards Goal     Problem: Falls - Risk of  Goal: *Absence of Falls  Description: Document Schmid Fall Risk and appropriate interventions in the flowsheet.  Outcome: Progressing Towards Goal  Note: Fall Risk Interventions:  Mobility Interventions: Communicate number of staff needed for ambulation/transfer, OT consult for ADLs, Patient to call before getting OOB, PT Consult for mobility concerns, PT Consult for assist device competence, Strengthening exercises (ROM-active/passive), Utilize walker, cane, or other assistive device         Medication Interventions: Patient to call before getting OOB, Teach patient to arise slowly                    Problem: Patient Education: Go to Patient Education Activity  Goal: Patient/Family Education  Outcome: Progressing Towards Goal     Problem: Pressure Injury - Risk of  Goal: *Prevention of pressure injury  Description: Document Braden Scale and appropriate interventions in the flowsheet.  Outcome: Progressing Towards Goal  Note: Pressure Injury Interventions:  Sensory Interventions: Assess changes in LOC, Discuss PT/OT consult with provider, Float heels, Keep linens dry and wrinkle-free, Maintain/enhance activity level, Minimize linen layers         Activity Interventions: Increase time out of bed, Pressure redistribution bed/mattress(bed type), PT/OT evaluation    Mobility Interventions: HOB 30 degrees or less, Pressure redistribution bed/mattress (bed type), PT/OT evaluation    Nutrition Interventions: Document food/fluid/supplement intake, Offer support with meals,snacks and hydration                     Problem: Patient Education: Go to Patient Education Activity  Goal: Patient/Family Education  Outcome: Progressing Towards Goal

## 2019-03-15 MED FILL — GABAPENTIN 400 MG CAP: 400 mg | ORAL | Qty: 2

## 2019-03-15 MED FILL — FAMOTIDINE 20 MG TAB: 20 mg | ORAL | Qty: 1

## 2019-03-15 MED FILL — BUPRENORPHINE-NALOXONE 8 MG-2 MG SUBLINGUAL TAB: 8-2 mg | SUBLINGUAL | Qty: 1

## 2019-03-15 MED FILL — RISPERIDONE 1 MG TAB: 1 mg | ORAL | Qty: 1

## 2019-03-15 MED FILL — DULOXETINE 60 MG CAP, DELAYED RELEASE: 60 mg | ORAL | Qty: 1

## 2019-03-15 MED FILL — NORMAL SALINE FLUSH 0.9 % INJECTION SYRINGE: INTRAMUSCULAR | Qty: 10

## 2019-03-15 MED FILL — MELOXICAM 7.5 MG TAB: 7.5 mg | ORAL | Qty: 1

## 2019-03-15 NOTE — Progress Notes (Signed)
Problem: Mobility Impaired (Adult and Pediatric)  Goal: *Acute Goals and Plan of Care (Insert Text)  Description: FUNCTIONAL STATUS PRIOR TO ADMISSION: Pt has hx of MS, diagnosed a few years ago, reports decline in mobility over last 3 months.  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  Pt lives with her husband in a two story apartment.  She was needing help for all mobility and some ADLs prior to admission.        HOME SUPPORT PRIOR TO ADMISSION: Needs help for all mobility but is home alone most of the day while her husband is at work    Physical Therapy Goals  Initiated 03/13/2019  1.  Patient will move from supine to sit and sit to supine , scoot up and down and roll side to side in bed with modified independence within 7 day(s).    2.  Patient will transfer from bed to chair and chair to bed with modified independence using the least restrictive device within 7 day(s).  3.  Patient will perform sit to stand with modified independence within 7 day(s).  4.  Patient will ambulate with modified independence for 50 feet with the least restrictive device within 7 day(s).   5.  Patient will ascend/descend 12 stairs with single handrail(s) with moderate assistance  within 7 day(s).     Outcome: Progressing Towards Goal       PHYSICAL THERAPY TREATMENT  Patient: Anita Bell (48 y.o. female)  Date: 03/15/2019  Diagnosis: Multiple sclerosis exacerbation ([place]) [G35]   <principal problem not specified>       Precautions: Fall  Chart, physical therapy assessment, plan of care and goals were reviewed.    ASSESSMENT  Patient continues with skilled PT services and is progressing towards goals. Pt has made good progression with her mobility. Pt is transferring at a Mod I level. And ambulating with rolling walker with supervision. Pt was able to complete steps for home access. Pt is too high level for inpt rehab. Pt could benefit from HHPT and assistance as needed for mobility.     Current Level of Function Impacting Discharge (mobility/balance): supervision     Other factors to consider for discharge: pain,         PLAN :  Patient continues to benefit from skilled intervention to address the above impairments.  Continue treatment per established plan of care.  to address goals.    Recommendation for discharge: (in order for the patient to meet his/her long term goals)  Physical therapy at least 2 days/week in the home AND ensure assist and/or supervision for safety with mobility as needed     This discharge recommendation:  Has not yet been discussed the attending provider and/or case management    IF patient discharges home will need the following DME: rolling walker       SUBJECTIVE:   Patient stated ???it is getting better.???    OBJECTIVE DATA SUMMARY:   Critical Behavior:  Neurologic State: Alert  Orientation Level: Oriented X4  Cognition: Appropriate decision making, Appropriate for age attention/concentration, Appropriate safety awareness  Safety/Judgement: Awareness of environment, Decreased awareness of need for assistance, Decreased awareness of need for safety, Decreased insight into deficits  Functional Mobility Training:  Bed Mobility:     Supine to Sit: Modified independent  Sit to Supine: Modified independent           Transfers:  Sit to Stand: Modified independent  Stand to Sit: Modified independent  Balance:  Sitting: Intact  Standing: Impaired  Standing - Static: Good  Standing - Dynamic : Good  Ambulation/Gait Training:  Distance (ft): 300 Feet (ft)  Assistive Device: Gait belt;Walker, rolling  Ambulation - Level of Assistance: Supervision        Gait Abnormalities: Antalgic;Decreased step clearance              Speed/Cadence: Pace decreased (<100 feet/min)  Step Length: Left shortened;Right shortened                    Stairs:  Number of Stairs Trained: 9  Stairs - Level of Assistance: Contact guard assistance   Rail Use: Left (sidestepping)     Therapeutic Exercises:     Pain Rating:  Back and left leg. Pt reports nothing new it is a continuous pain    Activity Tolerance:   limited  Please refer to the flowsheet for vital signs taken during this treatment.    After treatment patient left in no apparent distress:   Supine in bed and Call bell within reach    COMMUNICATION/COLLABORATION:   The patient???s plan of care was discussed with: Registered nurse.     Benetta Spar, PTA   Time Calculation: 25 mins

## 2019-03-15 NOTE — Progress Notes (Signed)
Care Management - Received call from Debbe Odea, liaison from [facility]. He is checking on patient. Explained patient to go home. If patient were to choose [facility], patient could transition to inpatient rehab seamlessly. Referral has already been sent to [facility]. Awaiting acceptance.     Collier Flowers, MSW

## 2019-03-15 NOTE — Progress Notes (Signed)
Bedside RN performed patient education and medication education. Discharge concerns initiated and discussed with patient, including clarification on "who" assists the patient at their home and instructions for when the home going patient should call their provider after discharge. Opportunity for questions and clarification was provided.      Patient receptive to education: YES  Patient stated: I will follow up  Barriers to Education: None  Diagnosis Education given:  YES    Length of stay: 3  Expected Day of Discharge: 03/15/2019  Ask if they have "Help at Home" & add to white board?  YES    Education Day #: 4    Medication Education Given:  YES  M in the box Medication name: N/A No new medications    Pt aware of [facility] survey: YES

## 2019-03-15 NOTE — Discharge Summary (Signed)
Discharge Summary by Humphrey Rolls, MD at 03/15/19 1409                Author: Humphrey Rolls, MD  Service: Hospitalist  Author Type: Physician       Filed: 03/15/19 1416  Date of Service: 03/15/19 1409  Status: Signed          Editor: Humphrey Rolls, MD (Physician)                       Discharge Summary           PATIENT ID: Anita Bell   MRN: [id number]    DATE OF BIRTH: January 04, 1971     DATE OF ADMISSION: 03/12/2019  2:07 PM     DATE OF DISCHARGE: 03/15/19 2pm    PRIMARY CARE PROVIDER: None       ATTENDING PHYSICIAN: Alaine Loughney   DISCHARGING PROVIDER: Humphrey Rolls, MD     To contact this individual call [phone] and ask the operator to page.  If unavailable ask to be transferred the Adult Hospitalist Department.      CONSULTATIONS: IP CONSULT TO NEUROLOGY      PROCEDURES/SURGERIES: * No surgery found *      ADMITTING DIAGNOSES & HOSPITAL COURSE:    Anita Bell??is a 48 y.o.??female??with h/o chronic back pain, ADHD, depression and MS diagnosed??3 years ago??presents to the ED complaining of right lower extremity  weakness for several months??but worse in the last 2 days. She also report slurred speech this morning but now resolved. She called her??neurologist who recommended patient to come to the emergency room for evaluation and possible IV steroids as per  her report. Patient has never followed with a neurologist but was scheduled to see one.????She denies headache, visual disturbance, decreased sensation, SOB, chest pain, nausea, vomiting, abdominal pain or urinary symptoms. ??Initial diagnosis of??MS  with some when she started experiencing lower extremity weakness. ??She states that she had MRI that showed MS (no record available). ??Patient also has history of chronic back pain due to mechanical fall on Suboxone. ??Patient has been experiencing  worsening lower back pain bilaterally. ??Pain does not radiate. ??She denies any decreased sensation of the lower extremities. ??Denies urinary retention or  fecal incontinence. ??No saddle paresthesia. ??No recent trauma or injury.        DISCHARGE DIAGNOSES / PLAN:           1.??Lower extremity weakness: unclear etiology   patient given one dose??Methylprednisolone 1000mg  on admission   - Neurology consulted   - MRI of brain was negative   Patient reports longstanding issues with lower back pain/injury   MRI of cervical and lumbar spine done showing DJD but no critical stenosis or acute issue   ??   2.??Chronic back pain - on Suboxone. Patient requester to restart her Suboxone and gabapentin to be restarted yesterday    Heating pad, MRI of C and L spine results noted??   3. Elevated blood pressure: no h/o hypertension.    Likely triggered by pain   - will monitor, prn antihypertensives   ??   4.????Depression/insomnia:    cont home meds. ??.   ??   5.????Anemia:-Checked iron panel, B12 and folate - all with within the normal range but folate and B12 low normal. She will need GI eval and GYN eval outpatient for further anemia workup        ADDITIONAL CARE RECOMMENDATIONS:    For back pain and  weakness related to degenerative joint disease in your neck and back   See rheumatology- [facility]   [address] [place] [address]   [phone]      See your primary care to discuss further workup for anemia- low hemoglobin levels   Your B12/folate/and iron levels were normal   You should get screened for gastrointestinal bleeding by your primary care MD   See OB/GYN for excessive uterine bleeding      Case manager to set up home PT   Working on getting patient a walker for home use on DC       PENDING TEST RESULTS:    At the time of discharge the following test results are still pending: ANA      FOLLOW UP APPOINTMENTS:       Follow-up Information               Follow up With  Specialties  Details  Why  Contact Info              None        None ([phone]) Patient stated that they have no PCP                       DIET: regular      ACTIVITY: Activity as  tolerated      WOUND CARE: NA      EQUIPMENT needed: walker         DISCHARGE MEDICATIONS:     Current Discharge Medication List              CONTINUE these medications which have NOT CHANGED          Details        buprenorphine-naloxone (SUBOXONE) 8-2 mg film sublingaul film  1 Film by SubLINGual route three (3) times daily.          Comments: .               DULoxetine (CYMBALTA) 60 mg capsule  Take 60 mg by mouth daily.               famotidine (PEPCID) 20 mg tablet  Take 20 mg by mouth two (2) times a day.               gabapentin (NEURONTIN) 800 mg tablet  Take 800 mg by mouth three (3) times daily.               mirtazapine (REMERON SOL-TAB) 15 mg disintegrating tablet  Take 15 mg by mouth nightly.               naproxen (NAPROSYN) 375 mg tablet  Take 375 mg by mouth two (2) times daily (with meals).               risperiDONE (RisperDAL) 1 mg tablet  Take 1 mg by mouth daily.                            NOTIFY YOUR PHYSICIAN FOR ANY OF THE FOLLOWING:    Fever over 101 degrees for 24 hours.    Chest pain, shortness of breath, fever, chills, nausea, vomiting, diarrhea, change in mentation, falling, weakness, bleeding. Severe pain or pain not relieved by medications.   Or, any other signs or symptoms that you may have questions about.      DISPOSITION:  Home With:     OT  X  PT    HH    RN                   Long term SNF/Inpatient Rehab     X  Independent living          Hospice          Other:           PATIENT CONDITION AT DISCHARGE:       Functional status         Poor        Deconditioned         X  Independent         Cognition       X   Lucid        Forgetful           Dementia         Catheters/lines (plus indication)         Foley        PICC        PEG         X  None         Code status       X   Full code           DNR         PHYSICAL EXAMINATION AT DISCHARGE:   Patient Vitals for the past 24 hrs:            Temp  Pulse  Resp  BP  SpO2            03/15/19 1006  98.6 ??F (37 ??C)  68  12  98/51   --            03/15/19 0603  98.3 ??F (36.8 ??C)  60  17  108/65  96 %     03/15/19 0217  98.3 ??F (36.8 ??C)  (!) 57  12  119/76  95 %     03/14/19 2148  98.6 ??F (37 ??C)  66  14  112/62  97 %            03/14/19 1801  98.4 ??F (36.9 ??C)  70  18  123/70  100 %                                                          Constitutional:   No acute distress, cooperative, pleasant??     ENT:   Oral mucosa moist, .      Resp:   CTA bilaterally. No wheezing/rhonchi/rales. No accessory muscle use     CV:   Regular rhythm, normal rate, no murmurs, gallops, rubs      GI:   Soft, non distended, non tender. normoactive bowel sounds, no hepatosplenomegaly       Musculoskeletal:   No edema, warm, 2+ pulses throughout      Neurologic:   mild right arm weakness, mild bilalt leg weakness (R>L) , decreased sensation of right  leg                           Psych:  normal mood and affect     ??  Recent Labs           03/12/19   1541     WBC  5.6     HGB  9.6*     HCT  30.5*     PLT  247     ??                  Recent Labs            03/14/19   0547  03/12/19   1541     NA  138  136     K  3.6  3.4*     CL  107  105     CO2  25  26     BUN  16  14     CREA  0.97  1.01     GLU  115*  82     CA  8.7  8.7     MG   --   2.1     ??               Recent Labs           03/12/19   1541     SGOT  16     ALT  20     AP  52     TBILI  0.5     TP  7.5     ALB  3.4*     GLOB  4.1*     ??   No results for input(s): INR, PTP, APTT, INREXT, INREXT in the last 72 hours.                Recent Labs           03/14/19   0547     TIBC  257     PSAT  21                           Lab Results         Component  Value  Date/Time          ??  Folate  5.4  03/14/2019 05:47 AM        EXAM: MRI LUMB SPINE WO CONT   Clinical history: Right leg weakness, back pain   INDICATION: Right leg weakness and back pain. Please include T11 and T12   ??   COMPARISON: Lumbar spine x-ray performed 01/23/2016   ??   TECHNIQUE: MR imaging of the lumbar spine was performed using the  following   sequences: sagittal T1, T2, STIR;  axial T1, T2.    ??   CONTRAST:  None.   ??   FINDINGS:   ??   Discogenic marrow degenerative marrow degenerative changes are greatest at   L4-L5. Loss of vertebral disc height at L4-L5. Acute fracture or dislocation.   There is no abnormality identified of T12 or T11. The superior margin of T11 is   not included on examination. No distal thoracic canal stenosis. Chronic severe   degenerative changes at L4-5.   ??   The conus medullaris terminates at T12-L1. Signal and caliber of the distal   spinal cord are within normal limits.    ??   The paraspinal soft tissues are within normal limits.   ??   Lower thoracic spine: No herniation or stenosis.   ??   L1-L2: No herniation or stenosis.   ??  L2-L3: No herniation or stenosis.   ??   L3-L4: No herniation or stenosis. Minimal facet arthropathy. The canal is   patent. Foramina are patent.   ??   L4-L5: Facet arthropathy. Chronic loss of vertebral body height. Disc   bulge/osteophyte. Canal is patent. Foramina are patent. Type I Modic changes.   ??   L5-S1: Minimal facet arthropathy. Canal and foramina are patent.    ??   ??   ??   IMPRESSION   IMPRESSION:   Chronic degenerative changes at L4-L5 without evidence of  canal or foraminal   compromise.    Discogenic marrow degenerative changes are most pronounced at L4-5.      EXAM: MRI CERV SPINE WO CONT   Clinical history: Right arm and leg weakness   INDICATION: eval right arm and leg weakness.   ??   COMPARISON: None   ??   TECHNIQUE: MR imaging of the cervical spine was performed using the following   sequences: sagittal T1, T2, STIR;  axial T2, T1.    ??   CONTRAST:  None.   ??   FINDINGS:   ??   No Chiari or syrinx. There is no fracture or dislocation. Disc protrusion at   C6-7 minimally effaces the ventral aspect of the cord without cord compression.   . Vertebral body heights are maintained. Marrow signal is normal.   ??   The craniocervical junction is intact. The course, caliber, and  signal intensity   of the spinal cord are normal.     ??   The paraspinal soft tissues are within normal limits.   ??   C2-C3: No herniation or stenosis.   ??   C3-C4: No herniation or stenosis.   ??   C4-C5: No herniation or stenosis. Minimal facet arthropathy. Disc desiccation.   Canal and foramina are patent.   ??   C5-C6: Minimal facet arthropathy. Disc desiccation. Canal and foramina are   patent.   ??   C6-C7: Mild central protrusion/osteophyte. Mild facet arthropathy. Minimal canal   and left foraminal stenosis. There is no right foraminal stenosis.       C7-T1: Disc bulge slightly asymmetric to the right. The canal and foramina are   patent.    IMPRESSION   IMPRESSION:   Minimal multilevel degenerative change with minimal canal and left foraminal   stenosis at C6-7.   There is no acute process identified.      EXAM: MRI BRAIN WO CONT   ??   INDICATION: rle weakness   ??   COMPARISON: CT 03/12/2019.   ??   CONTRAST: None.   ??   TECHNIQUE:     Multiplanar multisequence acquisition without contrast of the brain.   ??   FINDINGS:   The ventricles are normal in size and position. No significant white matter   disease. There is no acute infarct, hemorrhage, extra-axial fluid collection, or   mass effect. There is no cerebellar tonsillar herniation. Expected arterial   flow-voids are present.   ??   The paranasal sinuses, mastoid air cells, and middle ears are clear. The orbital   contents are within normal limits. No significant osseous or scalp lesions are   identified.   ??   IMPRESSION   IMPRESSION:    Unremarkable MRI of the brain.      CHRONIC MEDICAL DIAGNOSES:      Problem List as of 03/15/2019  Never Reviewed  Codes  Class  Noted - Resolved             Extrinsic asthma, unspecified  ICD-10-CM: J45.909   ICD-9-CM: 493.00    02/18/2009 - Present                       Myalgia and myositis, unspecified  ICD-10-CM: IMO0001   ICD-9-CM: 729.1    02/18/2009 - Present                       ADHD (attention  deficit hyperactivity disorder)  ICD-10-CM: F90.9   ICD-9-CM: 314.01    02/18/2009 - Present                          Greater than 30 minutes were spent with the patient on counseling and coordination of care      Signed:    Humphrey Rolls, MD   03/15/2019   2:09 PM

## 2019-03-15 NOTE — Progress Notes (Signed)
Hospital follow-up new PCP virtual  transitional care appointment has been scheduled with Myles Gip, NP  for Tuesday, 03/24/19 at 1:30 p.m.  Spoke with patient providing appointment information.  Mariane Masters, Care Management Specialist.

## 2019-03-15 NOTE — Progress Notes (Signed)
Care Management - Received call from Geralynn Ochs, liaison from [facility]. He is checking on patient. Explained patient to go home. If patient were to choose [facility], patient could transition to inpatient rehab seamlessly. Referral has already been sent to [facility]. Awaiting acceptance.     Lowella Dell, MSW

## 2019-03-15 NOTE — Progress Notes (Signed)
 Received handoff from CM regarding pt's decision to go home vs going to [facility].  [facility] has cancelled referral since chart indicates no active PCP. However, virtual follow up PCP new patient appointment scheduled for 6/2.    HH order for PT now sent to [facility] via allscripts.     Amy DELENA Lung, MSW    10:40am [facility] cannot accept pt's insurance.  CM spoke with pt by phone and reviewed that home health PT is not established yet, and she needs to discuss with her new provider PCP on 6/2 appointment. She voiced understanding.  She was also walking 300 feet on day of discharge.  Pt inquired about [facility] if needed and advised that she would also need to discuss with PCP.  She reports I'm doing well so far.  She does endorse still needing home health PT.    Spoke with [facility] liason Amy who understands above and will follow up with new PCP after new pt appointment 6/2. CM to route this note to new provider.         Greig DELENA Lung, MSW

## 2019-03-15 NOTE — Progress Notes (Signed)
Bedside and Verbal shift change report given to Brayton Caves, Charity fundraiser (oncoming nurse) by Alona Bene, RN (offgoing nurse). Report included the following information SBAR, Kardex, MAR, Recent Results and Cardiac Rhythm NSR.

## 2019-03-15 NOTE — Progress Notes (Signed)
 Problem: Mobility Impaired (Adult and Pediatric)  Goal: *Acute Goals and Plan of Care (Insert Text)  Description: FUNCTIONAL STATUS PRIOR TO ADMISSION: Pt has hx of MS, diagnosed a few years ago, reports decline in mobility over last 3 months.  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  Pt lives with her husband in a two story apartment.  She was needing help for all mobility and some ADLs prior to admission.        HOME SUPPORT PRIOR TO ADMISSION: Needs help for all mobility but is home alone most of the day while her husband is at work    Physical Therapy Goals  Initiated 03/13/2019  1.  Patient will move from supine to sit and sit to supine , scoot up and down and roll side to side in bed with modified independence within 7 day(s).    2.  Patient will transfer from bed to chair and chair to bed with modified independence using the least restrictive device within 7 day(s).  3.  Patient will perform sit to stand with modified independence within 7 day(s).  4.  Patient will ambulate with modified independence for 50 feet with the least restrictive device within 7 day(s).   5.  Patient will ascend/descend 12 stairs with single handrail(s) with moderate assistance  within 7 day(s).     Outcome: Progressing Towards Goal       PHYSICAL THERAPY TREATMENT  Patient: Anita Bell (48 y.o. female)  Date: 03/15/2019  Diagnosis: Multiple sclerosis exacerbation (HCC) [G35]   <principal problem not specified>       Precautions: Fall  Chart, physical therapy assessment, plan of care and goals were reviewed.    ASSESSMENT  Patient continues with skilled PT services and is progressing towards goals. Pt has made good progression with her mobility. Pt is transferring at a Mod I level. And ambulating with rolling walker with supervision. Pt was able to complete steps for home access. Pt is too high level for inpt rehab. Pt could benefit from HHPT and assistance as needed for mobility.    Current Level of Function Impacting Discharge  (mobility/balance): supervision     Other factors to consider for discharge: pain,         PLAN :  Patient continues to benefit from skilled intervention to address the above impairments.  Continue treatment per established plan of care.  to address goals.    Recommendation for discharge: (in order for the patient to meet his/her long term goals)  Physical therapy at least 2 days/week in the home AND ensure assist and/or supervision for safety with mobility as needed     This discharge recommendation:  Has not yet been discussed the attending provider and/or case management    IF patient discharges home will need the following DME: rolling walker       SUBJECTIVE:   Patient stated "it is getting better."    OBJECTIVE DATA SUMMARY:   Critical Behavior:  Neurologic State: Alert  Orientation Level: Oriented X4  Cognition: Appropriate decision making, Appropriate for age attention/concentration, Appropriate safety awareness  Safety/Judgement: Awareness of environment, Decreased awareness of need for assistance, Decreased awareness of need for safety, Decreased insight into deficits  Functional Mobility Training:  Bed Mobility:     Supine to Sit: Modified independent  Sit to Supine: Modified independent           Transfers:  Sit to Stand: Modified independent  Stand to Sit: Modified independent  Balance:  Sitting: Intact  Standing: Impaired  Standing - Static: Good  Standing - Dynamic : Good  Ambulation/Gait Training:  Distance (ft): 300 Feet (ft)  Assistive Device: Gait belt;Walker, rolling  Ambulation - Level of Assistance: Supervision        Gait Abnormalities: Antalgic;Decreased step clearance              Speed/Cadence: Pace decreased (<100 feet/min)  Step Length: Left shortened;Right shortened                    Stairs:  Number of Stairs Trained: 9  Stairs - Level of Assistance: Contact guard assistance   Rail Use: Left (sidestepping)    Therapeutic Exercises:     Pain Rating:  Back and  left leg. Pt reports nothing new it is a continuous pain    Activity Tolerance:   limited  Please refer to the flowsheet for vital signs taken during this treatment.    After treatment patient left in no apparent distress:   Supine in bed and Call bell within reach    COMMUNICATION/COLLABORATION:   The patient's plan of care was discussed with: Registered nurse.     L[place]aine JAYSON Eva, PTA   Time Calculation: 25 mins

## 2019-03-15 NOTE — Progress Notes (Signed)
 Transition of Care Plan  . RUR- Low- 7 %  . Disposition:  This CM sent request to assist patient in getting PCP though CM specialist.  Freedom of Choice explained for [facility].  Patient stated that she sees [facility] weekly.   [facility] can be reached at [phone] and physical address is [address] [place] [address].  SABRA Patient stated that this Clinic sees her for all medical and mental health concerns.      . This CM called this clinic, but being that this is a weekend no one answered.  From researching Clinic [facility].  Patient continued to explain that this is per PCP.  Discussed HH services and patient explained that she does not want to go to [facility] and wants HH.  Choices were explained to patient and patient selected HH with [facility].  Referral sent in CC link.  Explained to patient that PCP needs to be established for [facility] services.  Encouraged patient to follow up with clinic as requested, and explained that [facility] referral may not be accepted if patient does not have a PCP.  . Transport: Ly[employer]  . Continue Medical Care   . Follow up: PCP/Specialist(s)         Richardson CROME Maddox  2:23 PM

## 2019-03-15 NOTE — Progress Notes (Signed)
 Bedside RN performed patient education and medication education. Discharge concerns initiated and discussed with patient, including clarification on who assists the patient at their home and instructions for when the home going patient should call their provider after discharge. Opportunity for questions and clarification was provided.      Patient receptive to education: YES  Patient stated: I will follow up  Barriers to Education: None  Diagnosis Education given:  YES    Length of stay: 3  Expected Day of Discharge: 03/15/2019  Ask if they have Help at Home & add to white board?  YES    Education Day #: 4    Medication Education Given:  YES  M in the box Medication name: N/A No new medications    Pt aware of [facility] survey: YES

## 2019-03-15 NOTE — Discharge Summary (Signed)
Discharge Summary       PATIENT ID: Anita Bell  MRN: [id number]   DATE OF BIRTH: 1971-03-05    DATE OF ADMISSION: 03/12/2019  2:07 PM    DATE OF DISCHARGE: 03/15/19 2pm   PRIMARY CARE PROVIDER: None     ATTENDING PHYSICIAN: Sharlotte Baka  DISCHARGING PROVIDER: Humphrey Rolls, MD    To contact this individual call [phone] and ask the operator to page.  If unavailable ask to be transferred the Adult Hospitalist Department.    CONSULTATIONS: IP CONSULT TO NEUROLOGY    PROCEDURES/SURGERIES: * No surgery found *    ADMITTING DIAGNOSES & HOSPITAL COURSE:   Anita Bell??is a 48 y.o.??female??with h/o chronic back pain, ADHD, depression and MS diagnosed??3 years ago??presents to the ED complaining of right lower extremity weakness for several months??but worse in the last 2 days. She also report slurred speech this morning but now resolved. She called her??neurologist who recommended patient to come to the emergency room for evaluation and possible IV steroids as per her report. Patient has never followed with a neurologist but was scheduled to see one.????She denies headache, visual disturbance, decreased sensation, SOB, chest pain, nausea, vomiting, abdominal pain or urinary symptoms. ??Initial diagnosis of??MS with some when she started experiencing lower extremity weakness. ??She states that she had MRI that showed MS (no record available). ??Patient also has history of chronic back pain due to mechanical fall on Suboxone. ??Patient has been experiencing worsening lower back pain bilaterally. ??Pain does not radiate. ??She denies any decreased sensation of the lower extremities. ??Denies urinary retention or fecal incontinence. ??No saddle paresthesia. ??No recent trauma or injury.    DISCHARGE DIAGNOSES / PLAN:      1.??Lower extremity weakness: unclear etiology  patient given one dose??Methylprednisolone 1000mg  on admission  - Neurology consulted  - MRI of brain was negative   Patient reports longstanding issues with lower back pain/injury  MRI of cervical and lumbar spine done showing DJD but no critical stenosis or acute issue  ??  2.??Chronic back pain - on Suboxone. Patient requester to restart her Suboxone and gabapentin to be restarted yesterday   Heating pad, MRI of C and L spine results noted??  3. Elevated blood pressure: no h/o hypertension.   Likely triggered by pain  - will monitor, prn antihypertensives  ??  4.????Depression/insomnia:   cont home meds. ??.  ??  5.????Anemia:-Checked iron panel, B12 and folate - all with within the normal range but folate and B12 low normal. She will need GI eval and GYN eval outpatient for further anemia workup     ADDITIONAL CARE RECOMMENDATIONS:   For back pain and weakness related to degenerative joint disease in your neck and back  See rheumatology- [facility]  [address] [place] [address]  [phone]    See your primary care to discuss further workup for anemia- low hemoglobin levels  Your B12/folate/and iron levels were normal  You should get screened for gastrointestinal bleeding by your primary care MD  See OB/GYN for excessive uterine bleeding    Case manager to set up home PT  Working on getting patient a walker for home use on DC     PENDING TEST RESULTS:   At the time of discharge the following test results are still pending: ANA    FOLLOW UP APPOINTMENTS:    Follow-up Information     Follow up With Specialties Details Why Contact Info    None  None ([phone]) Patient stated that they have no PCP               DIET: regular    ACTIVITY: Activity as tolerated    WOUND CARE: NA    EQUIPMENT needed: walker      DISCHARGE MEDICATIONS:  Current Discharge Medication List      CONTINUE these medications which have NOT CHANGED    Details   buprenorphine-naloxone (SUBOXONE) 8-2 mg film sublingaul film 1 Film by SubLINGual route three (3) times daily.    Comments: .       DULoxetine (CYMBALTA) 60 mg capsule Take 60 mg by mouth daily.      famotidine (PEPCID) 20 mg tablet Take 20 mg by mouth two (2) times a day.      gabapentin (NEURONTIN) 800 mg tablet Take 800 mg by mouth three (3) times daily.      mirtazapine (REMERON SOL-TAB) 15 mg disintegrating tablet Take 15 mg by mouth nightly.      naproxen (NAPROSYN) 375 mg tablet Take 375 mg by mouth two (2) times daily (with meals).      risperiDONE (RisperDAL) 1 mg tablet Take 1 mg by mouth daily.               NOTIFY YOUR PHYSICIAN FOR ANY OF THE FOLLOWING:   Fever over 101 degrees for 24 hours.   Chest pain, shortness of breath, fever, chills, nausea, vomiting, diarrhea, change in mentation, falling, weakness, bleeding. Severe pain or pain not relieved by medications.  Or, any other signs or symptoms that you may have questions about.    DISPOSITION:    Home With:   OT X PT  HH  RN       Long term SNF/Inpatient Rehab   X Independent living    Hospice    Other:       PATIENT CONDITION AT DISCHARGE:     Functional status    Poor     Deconditioned    X Independent      Cognition   X  Lucid     Forgetful     Dementia      Catheters/lines (plus indication)    Foley     PICC     PEG    X None      Code status   X  Full code     DNR      PHYSICAL EXAMINATION AT DISCHARGE:  Patient Vitals for the past 24 hrs:   Temp Pulse Resp BP SpO2   03/15/19 1006 98.6 ??F (37 ??C) 68 12 98/51 ???   03/15/19 0603 98.3 ??F (36.8 ??C) 60 17 108/65 96 %   03/15/19 0217 98.3 ??F (36.8 ??C) (!) 57 12 119/76 95 %   03/14/19 2148 98.6 ??F (37 ??C) 66 14 112/62 97 %   03/14/19 1801 98.4 ??F (36.9 ??C) 70 18 123/70 100 %                                                   Constitutional:  No acute distress, cooperative, pleasant??   ENT:  Oral mucosa moist, .    Resp:  CTA bilaterally. No wheezing/rhonchi/rales. No accessory muscle use   CV:  Regular rhythm, normal rate, no murmurs, gallops, rubs    GI:  Soft, non distended,  non tender. normoactive bowel sounds, no  hepatosplenomegaly     Musculoskeletal:  No edema, warm, 2+ pulses throughout    Neurologic:  mild right arm weakness, mild bilalt leg weakness (R>L) , decreased sensation of right leg                         Psych:  normal mood and affect  ??         Recent Labs     03/12/19  1541   WBC 5.6   HGB 9.6*   HCT 30.5*   PLT 247   ??       Recent Labs     03/14/19  0547 03/12/19  1541   NA 138 136   K 3.6 3.4*   CL 107 105   CO2 25 26   BUN 16 14   CREA 0.97 1.01   GLU 115* 82   CA 8.7 8.7   MG  --  2.1   ??      Recent Labs     03/12/19  1541   SGOT 16   ALT 20   AP 52   TBILI 0.5   TP 7.5   ALB 3.4*   GLOB 4.1*   ??  No results for input(s): INR, PTP, APTT, INREXT, INREXT in the last 72 hours.       Recent Labs     03/14/19  0547   TIBC 257   PSAT 21            Lab Results   Component Value Date/Time   ?? Folate 5.4 03/14/2019 05:47 AM     EXAM: MRI LUMB SPINE WO CONT  Clinical history: Right leg weakness, back pain  INDICATION: Right leg weakness and back pain. Please include T11 and T12  ??  COMPARISON: Lumbar spine x-ray performed 01/23/2016  ??  TECHNIQUE: MR imaging of the lumbar spine was performed using the following  sequences: sagittal T1, T2, STIR;  axial T1, T2.   ??  CONTRAST:  None.  ??  FINDINGS:  ??  Discogenic marrow degenerative marrow degenerative changes are greatest at  L4-L5. Loss of vertebral disc height at L4-L5. Acute fracture or dislocation.  There is no abnormality identified of T12 or T11. The superior margin of T11 is  not included on examination. No distal thoracic canal stenosis. Chronic severe  degenerative changes at L4-5.  ??  The conus medullaris terminates at T12-L1. Signal and caliber of the distal  spinal cord are within normal limits.   ??  The paraspinal soft tissues are within normal limits.  ??  Lower thoracic spine: No herniation or stenosis.  ??  L1-L2: No herniation or stenosis.  ??  L2-L3: No herniation or stenosis.  ??   L3-L4: No herniation or stenosis. Minimal facet arthropathy. The canal is  patent. Foramina are patent.  ??  L4-L5: Facet arthropathy. Chronic loss of vertebral body height. Disc  bulge/osteophyte. Canal is patent. Foramina are patent. Type I Modic changes.  ??  L5-S1: Minimal facet arthropathy. Canal and foramina are patent.   ??  ??  ??  IMPRESSION  IMPRESSION:  Chronic degenerative changes at L4-L5 without evidence of  canal or foraminal  compromise.   Discogenic marrow degenerative changes are most pronounced at L4-5.    EXAM: MRI CERV SPINE WO CONT  Clinical history: Right arm and leg weakness  INDICATION: eval right arm and leg weakness.  ??  COMPARISON: None  ??  TECHNIQUE: MR imaging of the cervical spine was performed using the following  sequences: sagittal T1, T2, STIR;  axial T2, T1.   ??  CONTRAST:  None.  ??  FINDINGS:  ??  No Chiari or syrinx. There is no fracture or dislocation. Disc protrusion at  C6-7 minimally effaces the ventral aspect of the cord without cord compression.  . Vertebral body heights are maintained. Marrow signal is normal.  ??  The craniocervical junction is intact. The course, caliber, and signal intensity  of the spinal cord are normal.    ??  The paraspinal soft tissues are within normal limits.  ??  C2-C3: No herniation or stenosis.  ??  C3-C4: No herniation or stenosis.  ??  C4-C5: No herniation or stenosis. Minimal facet arthropathy. Disc desiccation.  Canal and foramina are patent.  ??  C5-C6: Minimal facet arthropathy. Disc desiccation. Canal and foramina are  patent.  ??  C6-C7: Mild central protrusion/osteophyte. Mild facet arthropathy. Minimal canal  and left foraminal stenosis. There is no right foraminal stenosis.     C7-T1: Disc bulge slightly asymmetric to the right. The canal and foramina are  patent.   IMPRESSION  IMPRESSION:  Minimal multilevel degenerative change with minimal canal and left foraminal  stenosis at C6-7.  There is no acute process identified.     EXAM: MRI BRAIN WO CONT  ??  INDICATION: rle weakness  ??  COMPARISON: CT 03/12/2019.  ??  CONTRAST: None.  ??  TECHNIQUE:    Multiplanar multisequence acquisition without contrast of the brain.  ??  FINDINGS:  The ventricles are normal in size and position. No significant white matter  disease. There is no acute infarct, hemorrhage, extra-axial fluid collection, or  mass effect. There is no cerebellar tonsillar herniation. Expected arterial  flow-voids are present.  ??  The paranasal sinuses, mastoid air cells, and middle ears are clear. The orbital  contents are within normal limits. No significant osseous or scalp lesions are  identified.  ??  IMPRESSION  IMPRESSION:   Unremarkable MRI of the brain.    CHRONIC MEDICAL DIAGNOSES:  Problem List as of 03/15/2019 Never Reviewed          Codes Class Noted - Resolved    Extrinsic asthma, unspecified ICD-10-CM: J45.909  ICD-9-CM: 493.00  02/18/2009 - Present        Myalgia and myositis, unspecified ICD-10-CM: IMO0001  ICD-9-CM: 729.1  02/18/2009 - Present        ADHD (attention deficit hyperactivity disorder) ICD-10-CM: F90.9  ICD-9-CM: 314.01  02/18/2009 - Present              Greater than 30 minutes were spent with the patient on counseling and coordination of care    Signed:   Humphrey Rolls, MD  03/15/2019  2:09 PM

## 2019-03-15 NOTE — Progress Notes (Signed)
Bedside and Verbal shift change report given to Evelena Peat, Therapist, sports (oncoming nurse) by Matt Holmes (offgoing nurse). Report included the following information SBAR, Kardex, MAR, Recent Results and Cardiac Rhythm NSR.

## 2019-03-15 NOTE — Progress Notes (Addendum)
Transition of Care Plan  ? RUR- Low- 7 %  ? Disposition:  This CM sent request to assist patient in getting PCP though CM specialist.  Freedom of Choice explained for [facility].  Patient stated that she sees [facility] weekly.   [facility] can be reached at [phone] and physical address is [address] [place] [address].  ? Patient stated that this Clinic sees her for all medical and mental health concerns.      ? This CM called this clinic, but being that this is a weekend no one answered.  From researching Clinic it appears to [facility] a [facility].  Patient continued to explain that this is per PCP.  Discussed [place] services and patient explained that she does not want to go to [facility] and wants [facility].  Choices were explained to patient and patient selected [facility] with [employer].  Referral sent in CC link.  Explained to patient that PCP needs to be established for [facility] services.  Encouraged patient to follow up with clinic as requested, and explained that [facility] referral may not be accepted if patient does not have a PCP.  ? Transport: Lyft  ? Continue Medical Care   ? Follow up: PCP/Specialist(s)         Margrett Rud Maddox  2:23 PM

## 2019-03-15 NOTE — Progress Notes (Addendum)
Received handoff from CM regarding pt's decision to go home vs going to [facility].  [facility] has cancelled referral since chart indicates no active PCP. However, virtual follow up PCP new patient appointment scheduled for 6/2.    HH order for PT now sent to [facility] via allscripts.     Camauri Craton Isa Rankin, MSW    10:40am [facility] cannot accept pt's insurance.  CM spoke with pt by phone and reviewed that home health PT is not established yet, and she needs to discuss with her new provider PCP on 6/2 appointment. She voiced understanding.  She was also walking 300 feet on day of discharge.  Pt inquired about [facility] if needed and advised that she would also need to discuss with PCP.  She reports "I'm doing well" so far.  She does endorse still needing home health PT.    Spoke with [place] who understands above and will follow up with new PCP after new pt appointment 6/2. CM to route this note to new provider.         Resa Miner, MSW

## 2019-03-16 NOTE — Telephone Encounter (Addendum)
03/16/19 1:15 PM--reached patient but she is on a long-distance call and unable to speak right now.  She is agreeable to a phone call tomorrow.    03/17/19 2:56 PM     Patient contacted regarding recent discharge and COVID-19 risk. Discussed COVID-19 related testing which was not done at this time. Test results were not done. Patient informed of results, if available? N/A    Care Transition Nurse/ Ambulatory Care Manager contacted the patient by telephone to perform post discharge assessment. Verified name and DOB with patient as identifiers.     Patient has following risk factors of: COPD and pneumonia. CTN/ACM reviewed discharge instructions, medical action plan and red flags related to discharge diagnosis. Did not review and educate them on any new and changed medications related to discharge diagnosis. She has no new medications and states she understands her current meds and declines med review with pharmacist at this time.  Through [facility], we scheduled a new pt virtual appt with [facility]'s Dr. Elyse Jarvis for 5/27 @ 9AM.     Education provided regarding infection prevention, and signs and symptoms of COVID-19 and when to seek medical attention with patient who verbalized understanding. Discussed exposure protocols and quarantine from [facility] Guidelines ???Are you at higher risk for severe illness 2019??? and given an opportunity for questions and concerns. The patient agrees to contact the COVID-19 hotline [phone] or PCP office for questions related to their healthcare. CTN/ACM provided contact information for future reference.    From CDC: Are you at higher risk for severe illness?    ??? Wash your hands often.  ??? Avoid close contact (6 feet, which is about two arm lengths) with people who are sick.  ??? Put distance between yourself and other people if COVID-19 is spreading in your community.  ??? Clean and disinfect frequently touched surfaces.  ??? Avoid all cruise travel and non-essential air travel.   ??? Call your healthcare professional if you have concerns about COVID-19 and your underlying condition or if you are sick.    For more information on steps you can take to protect yourself, see CDC's How to Protect Yourself      Encouraged patient to activate MyChart and she agreed to an email link--this was sent.  Advised I will send our COVID-19 information and resource phone #s to her via the pt portal once it is activated.    Updated healthcare decision makers and contact information together in patient's chart.    Patient/family/caregiver given information for [employer] and agrees to enroll yes  Patient's preferred e-mail:  [email or web address]  Patient's preferred phone number: [phone]  Based on Loop alert triggers, patient will be contacted by nurse care manager for worsening symptoms.    Pt will be further monitored by COVID Loop Team?? based on severity of symptoms and risk factors.

## 2019-03-17 LAB — ANA, DIRECT, W/REFLEX
ANA, Direct: NEGATIVE
ANA: NEGATIVE

## 2019-03-17 NOTE — Progress Notes (Signed)
 Received handoff from CM regarding pt's decision to go home vs going to [facility].  [facility] has cancelled referral since chart indicates no active PCP. However, virtual follow up PCP new patient appointment scheduled for 6/2.    HH order for PT now sent to [facility] via allscripts.     Greig DELENA Lung, MSW

## 2019-03-17 NOTE — Progress Notes (Signed)
Received handoff from CM regarding pt's decision to go home vs going to [facility].  [facility] has cancelled referral since chart indicates no active PCP. However, virtual follow up PCP new patient appointment scheduled for 6/2.  ??  HH order for PT now sent to [facility] via allscripts.   ??  Layza Summa Isa Rankin, MSW

## 2019-03-18 ENCOUNTER — Encounter: Attending: Rheumatology

## 2019-03-18 LAB — VITAMIN B1, WHOLE BLOOD
Vitamin B1,Whole Blood: 152.6 nmol/L (ref 66.5–200.0)
Vitamin B1: 152.6 nmol/L (ref 66.5–200.0)

## 2019-03-24 ENCOUNTER — Telehealth: Admit: 2019-03-24 | Discharge: 2019-03-24 | Payer: PRIVATE HEALTH INSURANCE | Attending: Family

## 2019-03-24 ENCOUNTER — Encounter: Attending: Family

## 2019-03-24 NOTE — Progress Notes (Signed)
 Pt is here for   Chief Complaint   Patient presents with   . Transitions Of Care     Hospital follow up [facility] for MS Exacerbation     Pt denies pain at this time    1. Have you been to the ER, urgent care clinic since your last visit?  Hospitalized since your last visit?Yes When: 03/12/2019 [facility] for MS    2. Have you seen or consulted any other health care providers outside of the [facility] since your last visit?  Include any pap smears or colon screening. No

## 2019-03-24 NOTE — Progress Notes (Signed)
Pt unable to log into either platform. Will reschedule for either in person visit or another virtual visit with VIDEO.

## 2019-03-24 NOTE — Progress Notes (Signed)
Pt is here for   Chief Complaint   Patient presents with   ??? [facility] follow up [facility] for MS Exacerbation     Pt denies pain at this time    1. Have you been to the ER, urgent care clinic since your last visit?  Hospitalized since your last visit?Yes When: 03/12/2019 [facility] for MS    2. Have you seen or consulted any other health care providers outside of the [facility] since your last visit?  Include any pap smears or colon screening. No

## 2019-03-30 NOTE — Telephone Encounter (Signed)
-----   Message from Myles Gip, NP sent at 03/24/2019  2:28 PM EDT -----  Regarding: NP visit  Hey ladies, this pt was NOT able to have her visit today due to technical difficulties. She can come on Monday however. Can one of you schedule her please?

## 2019-03-30 NOTE — Telephone Encounter (Signed)
I left message for patient to return my call.

## 2019-04-07 ENCOUNTER — Encounter: Attending: Rheumatology

## 2019-07-20 DIAGNOSIS — R509 Fever, unspecified: Principal | ICD-10-CM

## 2019-07-21 ENCOUNTER — Emergency Department: Admit: 2019-07-21 | Payer: MEDICAID

## 2019-07-21 ENCOUNTER — Inpatient Hospital Stay
Admit: 2019-07-21 | Discharge: 2019-07-25 | Disposition: A | Discharge status: Home / Self Care | Payer: MEDICAID | Attending: Internal Medicine | Admitting: Internal Medicine

## 2019-07-21 ENCOUNTER — Emergency Department: Payer: MEDICAID

## 2019-07-21 LAB — EKG 12-LEAD
Atrial Rate: 67 {beats}/min
Diagnosis: NORMAL
P Axis: 59 degrees
P-R Interval: 166 ms
Q-T Interval: 424 ms
QRS Duration: 82 ms
QTc Calculation (Bazett): 448 ms
R Axis: 5 degrees
T Axis: 0 degrees
Ventricular Rate: 67 {beats}/min

## 2019-07-21 LAB — CBC WITH AUTO DIFFERENTIAL
Band Neutrophils: 6 % (ref 0–6)
Basophils %: 0 % (ref 0–1)
Basophils Absolute: 0 10*3/uL (ref 0.0–0.1)
Eosinophils %: 0 % (ref 0–7)
Eosinophils Absolute: 0 10*3/uL (ref 0.0–0.4)
Granulocyte Absolute Count: 0 10*3/uL
Hematocrit: 34.2 % — ABNORMAL LOW (ref 35.0–47.0)
Hemoglobin: 10.8 g/dL — ABNORMAL LOW (ref 11.5–16.0)
Immature Granulocytes: 0 %
Lymphocytes %: 7 % — ABNORMAL LOW (ref 12–49)
Lymphocytes Absolute: 0.6 10*3/uL — ABNORMAL LOW (ref 0.8–3.5)
MCH: 27.3 PG (ref 26.0–34.0)
MCHC: 31.6 g/dL (ref 30.0–36.5)
MCV: 86.6 FL (ref 80.0–99.0)
MPV: 11.3 FL (ref 8.9–12.9)
Monocytes %: 2 % — ABNORMAL LOW (ref 5–13)
Monocytes Absolute: 0.2 10*3/uL (ref 0.0–1.0)
NRBC Absolute: 0 10*3/uL (ref 0.00–0.01)
Neutrophils %: 85 % — ABNORMAL HIGH (ref 32–75)
Neutrophils Absolute: 8.2 10*3/uL — ABNORMAL HIGH (ref 1.8–8.0)
Nucleated RBCs: 0 PER 100 WBC
Platelets: 220 10*3/uL (ref 150–400)
RBC: 3.95 M/uL (ref 3.80–5.20)
RDW: 14.9 % — ABNORMAL HIGH (ref 11.5–14.5)
WBC: 9 10*3/uL (ref 3.6–11.0)

## 2019-07-21 LAB — DRUG SCREEN, URINE
AMPHETAMINES: NEGATIVE
Amphetamine Screen, Urine: NEGATIVE
BARBITURATES: NEGATIVE
BENZODIAZEPINES: NEGATIVE
Barbiturate Screen, Urine: NEGATIVE
Benzodiazepine Screen, Urine: NEGATIVE
COCAINE: POSITIVE — AB
Cocaine Screen Urine: POSITIVE — AB
METHADONE: POSITIVE — AB
Methadone Screen, Urine: POSITIVE — AB
OPIATES: POSITIVE — AB
Opiate Screen, Urine: POSITIVE — AB
PCP Screen, Urine: NEGATIVE
PCP(PHENCYCLIDINE): NEGATIVE
THC (TH-CANNABINOL): POSITIVE — AB
THC Screen, Urine: POSITIVE — AB

## 2019-07-21 LAB — COMPREHENSIVE METABOLIC PANEL
ALT: 25 U/L (ref 12–78)
AST: 20 U/L (ref 15–37)
Albumin/Globulin Ratio: 0.8 — ABNORMAL LOW (ref 1.1–2.2)
Albumin: 3.6 g/dL (ref 3.5–5.0)
Alkaline Phosphatase: 78 U/L (ref 45–117)
Anion Gap: 8 mmol/L (ref 5–15)
BUN: 9 MG/DL (ref 6–20)
Bun/Cre Ratio: 8 — ABNORMAL LOW (ref 12–20)
CO2: 25 mmol/L (ref 21–32)
Calcium: 9.1 MG/DL (ref 8.5–10.1)
Chloride: 105 mmol/L (ref 97–108)
Creatinine: 1.06 MG/DL — ABNORMAL HIGH (ref 0.55–1.02)
EGFR IF NonAfrican American: 56 mL/min/{1.73_m2} — ABNORMAL LOW (ref >60)
GFR African American: >60 mL/min/{1.73_m2} (ref >60)
Globulin: 4.4 g/dL — ABNORMAL HIGH (ref 2.0–4.0)
Glucose: 114 mg/dL — ABNORMAL HIGH (ref 65–100)
Potassium: 3.2 mmol/L — ABNORMAL LOW (ref 3.5–5.1)
Sodium: 138 mmol/L (ref 136–145)
Total Bilirubin: 0.6 MG/DL (ref 0.2–1.0)
Total Protein: 8 g/dL (ref 6.4–8.2)

## 2019-07-21 LAB — APTT: aPTT: 26.8 s (ref 22.1–32.0)

## 2019-07-21 LAB — TROPONIN: Troponin I: <0.05 ng/mL (ref <0.05)

## 2019-07-21 LAB — URINALYSIS WITH MICROSCOPIC
BACTERIA, URINE: NEGATIVE /hpf
Bilirubin, Urine: NEGATIVE
Glucose, Ur: NEGATIVE mg/dL
Ketones, Urine: NEGATIVE mg/dL
Nitrite, Urine: NEGATIVE
Protein, UA: NEGATIVE mg/dL
Specific Gravity, UA: 1.008 (ref 1.003–1.030)
Urobilinogen, UA, POCT: 1 EU/dL (ref 0.2–1.0)
pH, UA: 7 (ref 5.0–8.0)

## 2019-07-21 LAB — PROTIME-INR
INR: 1.1 (ref 0.9–1.1)
Protime: 11.5 s — ABNORMAL HIGH (ref 9.0–11.1)

## 2019-07-21 LAB — FIBRINOGEN
Fibrinogen: 254 mg/dL (ref 200–475)
Fibrinogen: 254 mg/dL (ref 200–475)

## 2019-07-21 LAB — CK
CK: 125 U/L (ref 26–192)
Total CK: 125 U/L (ref 26–192)

## 2019-07-21 LAB — ETHYL ALCOHOL
ALCOHOL(ETHYL),SERUM: <10 MG/DL (ref <10)
Ethyl Alcohol: <10 MG/DL (ref <10)

## 2019-07-21 LAB — FERRITIN
Ferritin: 49 ng/mL (ref 26–388)
Ferritin: 49 NG/ML (ref 26–388)

## 2019-07-21 LAB — LACTATE DEHYDROGENASE: LD: 206 U/L (ref 81–246)

## 2019-07-21 LAB — LACTIC ACID
Lactic Acid: 1.1 MMOL/L (ref 0.4–2.0)
Lactic acid: 1.1 MMOL/L (ref 0.4–2.0)

## 2019-07-21 LAB — PROCALCITONIN
Procalcitonin: 19.55 ng/mL
Procalcitonin: 19.55 ng/mL

## 2019-07-21 LAB — D-DIMER, QUANTITATIVE: D-Dimer, Quant: 1.86 mg/L FEU — ABNORMAL HIGH (ref 0.00–0.65)

## 2019-07-21 LAB — URINALYSIS W/MICROSCOPIC
Bacteria: NEGATIVE /hpf
Bilirubin: NEGATIVE
Glucose: NEGATIVE mg/dL
Ketone: NEGATIVE mg/dL
Nitrites: NEGATIVE
Protein: NEGATIVE mg/dL
Specific gravity: 1.008 (ref 1.003–1.030)
Urobilinogen: 1 EU/dL (ref 0.2–1.0)
pH (UA): 7 (ref 5.0–8.0)

## 2019-07-21 LAB — CBC WITH AUTOMATED DIFF
ABS. BASOPHILS: 0 10*3/uL (ref 0.0–0.1)
ABS. EOSINOPHILS: 0 10*3/uL (ref 0.0–0.4)
ABS. IMM. GRANS.: 0 10*3/uL
ABS. LYMPHOCYTES: 0.6 10*3/uL — ABNORMAL LOW (ref 0.8–3.5)
ABS. MONOCYTES: 0.2 10*3/uL (ref 0.0–1.0)
ABS. NEUTROPHILS: 8.2 10*3/uL — ABNORMAL HIGH (ref 1.8–8.0)
ABSOLUTE NRBC: 0 10*3/uL (ref 0.00–0.01)
BAND NEUTROPHILS: 6 % (ref 0–6)
BASOPHILS: 0 % (ref 0–1)
EOSINOPHILS: 0 % (ref 0–7)
HCT: 34.2 % — ABNORMAL LOW (ref 35.0–47.0)
HGB: 10.8 g/dL — ABNORMAL LOW (ref 11.5–16.0)
IMMATURE GRANULOCYTES: 0 %
LYMPHOCYTES: 7 % — ABNORMAL LOW (ref 12–49)
MCH: 27.3 PG (ref 26.0–34.0)
MCHC: 31.6 g/dL (ref 30.0–36.5)
MCV: 86.6 FL (ref 80.0–99.0)
MONOCYTES: 2 % — ABNORMAL LOW (ref 5–13)
MPV: 11.3 FL (ref 8.9–12.9)
NEUTROPHILS: 85 % — ABNORMAL HIGH (ref 32–75)
NRBC: 0 PER 100 WBC
PLATELET: 220 10*3/uL (ref 150–400)
RBC: 3.95 M/uL (ref 3.80–5.20)
RDW: 14.9 % — ABNORMAL HIGH (ref 11.5–14.5)
WBC: 9 10*3/uL (ref 3.6–11.0)

## 2019-07-21 LAB — EKG, 12 LEAD, INITIAL
Atrial Rate: 67 {beats}/min
Calculated P Axis: 59 degrees
Calculated R Axis: 5 degrees
Calculated T Axis: 0 degrees
Diagnosis: NORMAL
P-R Interval: 166 ms
Q-T Interval: 424 ms
QRS Duration: 82 ms
QTC Calculation (Bezet): 448 ms
Ventricular Rate: 67 {beats}/min

## 2019-07-21 LAB — METABOLIC PANEL, COMPREHENSIVE
A-G Ratio: 0.8 — ABNORMAL LOW (ref 1.1–2.2)
ALT (SGPT): 25 U/L (ref 12–78)
AST (SGOT): 20 U/L (ref 15–37)
Albumin: 3.6 g/dL (ref 3.5–5.0)
Alk. phosphatase: 78 U/L (ref 45–117)
Anion gap: 8 mmol/L (ref 5–15)
BUN/Creatinine ratio: 8 — ABNORMAL LOW (ref 12–20)
BUN: 9 MG/DL (ref 6–20)
Bilirubin, total: 0.6 MG/DL (ref 0.2–1.0)
CO2: 25 mmol/L (ref 21–32)
Calcium: 9.1 MG/DL (ref 8.5–10.1)
Chloride: 105 mmol/L (ref 97–108)
Creatinine: 1.06 MG/DL — ABNORMAL HIGH (ref 0.55–1.02)
GFR est AA: >60 mL/min/{1.73_m2} (ref >60)
GFR est non-AA: 56 mL/min/{1.73_m2} — ABNORMAL LOW (ref >60)
Globulin: 4.4 g/dL — ABNORMAL HIGH (ref 2.0–4.0)
Glucose: 114 mg/dL — ABNORMAL HIGH (ref 65–100)
Potassium: 3.2 mmol/L — ABNORMAL LOW (ref 3.5–5.1)
Protein, total: 8 g/dL (ref 6.4–8.2)
Sodium: 138 mmol/L (ref 136–145)

## 2019-07-21 LAB — PTT: aPTT: 26.8 s (ref 22.1–32.0)

## 2019-07-21 LAB — PROTHROMBIN TIME + INR
INR: 1.1 (ref 0.9–1.1)
Prothrombin time: 11.5 s — ABNORMAL HIGH (ref 9.0–11.1)

## 2019-07-21 LAB — SAMPLES BEING HELD

## 2019-07-21 LAB — D DIMER: D-dimer: 1.86 mg/L FEU — ABNORMAL HIGH (ref 0.00–0.65)

## 2019-07-21 LAB — TROPONIN I: Troponin-I, Qt.: <0.05 ng/mL (ref <0.05)

## 2019-07-21 LAB — URINE CULTURE HOLD SAMPLE

## 2019-07-21 LAB — LD: LD: 206 U/L (ref 81–246)

## 2019-07-21 MED ORDER — VANCOMYCIN IN 0.9 % SODIUM CHLORIDE 1.25 GRAM/250 ML IV
1.25 gram/250 mL | INTRAVENOUS | Status: DC
Start: 2019-07-21 — End: 2019-07-22
  Administered 2019-07-22 (×2): via INTRAVENOUS

## 2019-07-21 MED ORDER — ACETAMINOPHEN 325 MG TABLET
325 mg | Freq: Four times a day (QID) | ORAL | Status: DC | PRN
Start: 2019-07-21 — End: 2019-07-25

## 2019-07-21 MED ORDER — ENOXAPARIN 40 MG/0.4 ML SUB-Q SYRINGE
40 mg/0.4 mL | Freq: Every day | SUBCUTANEOUS | Status: DC
Start: 2019-07-21 — End: 2019-07-25
  Administered 2019-07-21 – 2019-07-25 (×5): via SUBCUTANEOUS

## 2019-07-21 MED ORDER — VANCOMYCIN IN 0.9 % SODIUM CHLORIDE 1.75 GRAM/500 ML IV
1.75 gram/500 mL | Freq: Once | INTRAVENOUS | Status: AC
Start: 2019-07-21 — End: 2019-07-21
  Administered 2019-07-21: 11:00:00 via INTRAVENOUS

## 2019-07-21 MED ORDER — ACETAMINOPHEN 650 MG RECTAL SUPPOSITORY
650 mg | Freq: Four times a day (QID) | RECTAL | Status: DC | PRN
Start: 2019-07-21 — End: 2019-07-25

## 2019-07-21 MED ORDER — MIRTAZAPINE 15 MG TAB
15 mg | Freq: Every evening | ORAL | Status: DC
Start: 2019-07-21 — End: 2019-07-25
  Administered 2019-07-22 – 2019-07-25 (×4): via ORAL

## 2019-07-21 MED ORDER — .PHARMACY TO SUBSTITUTE PER PROTOCOL
Status: DC | PRN
Start: 2019-07-21 — End: 2019-07-22

## 2019-07-21 MED ORDER — SODIUM CHLORIDE 0.9 % IJ SYRG
INTRAMUSCULAR | Status: DC | PRN
Start: 2019-07-21 — End: 2019-07-25

## 2019-07-21 MED ORDER — NALOXONE 1 MG/ML IJ SYRG(AKA NARCAN)
1 mg/mL | INTRAMUSCULAR | Status: AC
Start: 2019-07-21 — End: 2019-07-21

## 2019-07-21 MED ORDER — ADV ADDAPTOR
3.375 gram | Freq: Three times a day (TID) | Status: DC
Start: 2019-07-21 — End: 2019-07-23
  Administered 2019-07-21 – 2019-07-23 (×8): via INTRAVENOUS

## 2019-07-21 MED ORDER — PROMETHAZINE 25 MG TAB
25 mg | Freq: Four times a day (QID) | ORAL | Status: DC | PRN
Start: 2019-07-21 — End: 2019-07-22

## 2019-07-21 MED ORDER — POLYETHYLENE GLYCOL 3350 17 GRAM (100 %) ORAL POWDER PACKET
17 gram | Freq: Every day | ORAL | Status: DC | PRN
Start: 2019-07-21 — End: 2019-07-25

## 2019-07-21 MED ORDER — ADV ADDAPTOR
3.375 gram | Freq: Once | Status: AC
Start: 2019-07-21 — End: 2019-07-21
  Administered 2019-07-21: 14:00:00 via INTRAVENOUS

## 2019-07-21 MED ORDER — ONDANSETRON (PF) 4 MG/2 ML INJECTION
4 mg/2 mL | Freq: Four times a day (QID) | INTRAMUSCULAR | Status: DC | PRN
Start: 2019-07-21 — End: 2019-07-22

## 2019-07-21 MED ORDER — SODIUM CHLORIDE 0.9 % IJ SYRG
Freq: Three times a day (TID) | INTRAMUSCULAR | Status: DC
Start: 2019-07-21 — End: 2019-07-25
  Administered 2019-07-21 – 2019-07-25 (×11): via INTRAVENOUS

## 2019-07-21 MED ORDER — GABAPENTIN 400 MG CAP
400 mg | Freq: Three times a day (TID) | ORAL | Status: DC
Start: 2019-07-21 — End: 2019-07-25
  Administered 2019-07-21 – 2019-07-25 (×12): via ORAL

## 2019-07-21 MED ORDER — SODIUM CHLORIDE 0.9% BOLUS IV
0.9 % | Freq: Once | INTRAVENOUS | Status: AC
Start: 2019-07-21 — End: 2019-07-21
  Administered 2019-07-21: 06:00:00 via INTRAVENOUS

## 2019-07-21 MED ORDER — MELOXICAM 7.5 MG TAB
7.5 mg | Freq: Every day | ORAL | Status: DC
Start: 2019-07-21 — End: 2019-07-25
  Administered 2019-07-21 – 2019-07-25 (×5): via ORAL

## 2019-07-21 MED ORDER — PHARMACY VANCOMYCIN NOTE
Status: DC
Start: 2019-07-21 — End: 2019-07-22

## 2019-07-21 MED FILL — MONOJECT PREFILL ADVANCED 0.9 % SODIUM CHLORIDE INJECTION SYRINGE: INTRAMUSCULAR | Qty: 40

## 2019-07-21 MED FILL — VANCOMYCIN 10 GRAM IV SOLR: 10 gram | INTRAVENOUS | Qty: 1750

## 2019-07-21 MED FILL — ENOXAPARIN 40 MG/0.4 ML SUB-Q SYRINGE: 40 mg/0.4 mL | SUBCUTANEOUS | Qty: 0.4

## 2019-07-21 MED FILL — GABAPENTIN 400 MG CAP: 400 mg | ORAL | Qty: 2

## 2019-07-21 MED FILL — VANCOMYCIN 10 GRAM IV SOLR: 10 gram | INTRAVENOUS | Qty: 1250

## 2019-07-21 MED FILL — MIRTAZAPINE 15 MG TAB: 15 mg | ORAL | Qty: 1

## 2019-07-21 MED FILL — PHARMACY VANCOMYCIN NOTE: Qty: 1

## 2019-07-21 MED FILL — NALOXONE 1 MG/ML IJ SYRG(AKA NARCAN): 1 mg/mL | INTRAMUSCULAR | Qty: 2

## 2019-07-21 MED FILL — PIPERACILLIN-TAZOBACTAM 3.375 GRAM IV SOLR: 3.375 gram | INTRAVENOUS | Qty: 3.38

## 2019-07-21 MED FILL — SODIUM CHLORIDE 0.9 % IV: INTRAVENOUS | Qty: 1000

## 2019-07-21 MED FILL — MELOXICAM 7.5 MG TAB: 7.5 mg | ORAL | Qty: 1

## 2019-07-21 NOTE — ED Notes (Signed)
TRANSFER - OUT REPORT:    Verbal report given to Melanie, RN(name) on [employer]  being transferred to [facility]) for routine progression of care       Report consisted of patient???s Situation, Background, Assessment and   Recommendations(SBAR).     Information from the following report(s) SBAR, ED Summary, Procedure Summary, Intake/Output, MAR, Recent Results and Cardiac Rhythm NSR-SB was reviewed with the receiving nurse.    Lines:   Peripheral IV 07/21/19 Left Hand (Active)   Site Assessment Clean, dry, & intact 07/21/19 1630   Phlebitis Assessment 0 07/21/19 1630   Infiltration Assessment 0 07/21/19 1630   Dressing Status Clean, dry, & intact 07/21/19 1630   Dressing Type Transparent;Tape 07/21/19 1630   Hub Color/Line Status Yellow;Infusing;Patent 07/21/19 1630   Action Taken Open ports on tubing capped 07/21/19 1230   Alcohol Cap Used No 07/21/19 1630        Opportunity for questions and clarification was provided.      Patient transported with:   Registered Nurse

## 2019-07-21 NOTE — ED Notes (Signed)
Report given to Kathleen, RN

## 2019-07-21 NOTE — Progress Notes (Signed)
Bedside and Verbal shift change report given to Carrie,RN (Soil scientist) by Felipa Evener (offgoing nurse). Report included the following information SBAR, Kardex, ED Summary, Procedure Summary, Intake/Output, MAR, Recent Results and Cardiac Rhythm NSR.

## 2019-07-21 NOTE — ED Notes (Signed)
Assumed care of patient who is resting in bed without c/o at this time. Call bell within reach.

## 2019-07-21 NOTE — ED Notes (Signed)
ED EKG interpretation:  Rhythm: normal sinus rhythm; and regular . Rate (approx.): 67; Axis: normal; P wave: normal; QRS interval: normal ; ST/T wave: non-specific changes; i This EKG was interpreted by Zollie Pee, MD,ED Provider.

## 2019-07-21 NOTE — Progress Notes (Signed)
 Pharmacist Note - Vancomycin Dosing    Consult provided for this 48 y.o. female for indication of Fever (POA): known etiology. concerned for bacteremia/endocarditis given h/o IV drug use and new murmur vs viral infection  Antibiotic regimen(s): vancomycin, Zosyn  Patient on vancomycin PTA? NO     Recent Labs     07/21/19  0127   WBC 9.0   CREA 1.06*   BUN 9     Frequency of BMP: Daily x 3 days  Height: 160 cm  Weight: 84 kg  Est CrCl: 68 ml/min; UO: unmeasured  Temp (24hrs), Avg:100 F (37.8 C), Min:99.1 F (37.3 C), Max:100.9 F (38.3 C)    Cultures:  09/29 Blood, paired - pending  09/29 U/A - nitrite neg, LE trace, bacteria neg  09/29 Urine - on hold    Goal trough = 15 - 20 mcg/mL    Therapy will be initiated with a loading dose of 1750 mg IV x 1 (09/29 @ 9367) to be followed by a maintenance dose of 1250 mg IV every 16 hours.  Pharmacy to follow patient daily and order levels / make dose adjustments as appropriate.

## 2019-07-21 NOTE — Progress Notes (Signed)
2041: TRANSFER - IN REPORT:    Verbal report received from carrie RN(name) on Raymona Sparlin  being received from ED(unit) for routine progression of care      Report consisted of patient's Situation, Background, Assessment and   Recommendations(SBAR).     Information from the following report(s) SBAR, ED Summary, Intake/Output, MAR, and Cardiac Rhythm NSR  was reviewed with the receiving nurse.    Opportunity for questions and clarification was provided.      Assessment completed upon patient's arrival to unit and care assumed.     2113: Primary Nurse Cyndy Freeze and Haynes Bast, RN performed a dual skin assessment on this patient No impairment noted  Braden score is 20.    2242: This RN called and left message with [facility] to call back and verify methadone dosage.        Problem: Risk for Spread of Infection  Goal: Prevent transmission of infectious organism to others  Description: Prevent the transmission of infectious organisms to other patients, staff members, and visitors.  Outcome: Progressing Towards Goal     Problem: Falls - Risk of  Goal: *Absence of Falls  Description: Document Bridgette Habermann Fall Risk and appropriate interventions in the flowsheet.  Outcome: Progressing Towards Goal  Note: Fall Risk Interventions:    Elimination Interventions: Call light in reach

## 2019-07-21 NOTE — Progress Notes (Signed)
Progress  Notes by Cheryll Cockayne, MD at 07/21/19 1502                Author: Cheryll Cockayne, MD  Service: Hospitalist  Author Type: Physician       Filed: 07/21/19 1514  Date of Service: 07/21/19 1502  Status: Addendum          Editor: Cheryll Cockayne, MD (Physician)          Related Notes: Original Note by Cheryll Cockayne, MD (Physician) filed at 07/21/19 [facility] summary: Anita Bell is a 48 y.o. female past medical history significant for polysubstance abuse, psychiatric disorders presented emergency room complaining on a fever.  She stated  that today she started having fevers and chills at home.  Unknown how high it was temperature at home.  She reports using cocaine and heroin around 7 PM and afterward she started feeling feverish.  She denies any symptoms such as headache, cough, shortness  of breath, chest pain, sore throat, runny nose, nausea, vomiting, diarrhea, urinary symptom.  On arrival to the emergency room patient was found to be febrile with a temperature of 100.9 lethargic but arousable.  Work-up overall unremarkable.  Given  her risk factor admission was requested.  Patient awake and alert at this time 07/21/2019         Assessment/Plan:   Fever (POA):??known etiology.??   - concern for bacteremia/endocarditis given hx IV drug use and new murmur vs viral infection.    -COVID 19 test pending    -Blood culture pending    - normal wbc, lactic. Elevated  pro cal 19.55    - CXR: no acute process    - empiric treatment with IV Vanco and zosyn   ??   ??Systolic Murmur: new finding as per pt report. ? Endocarditis   - Echocardiogram ordered   ??   Polysubstance abuse:   - UDS: positive for cocaine, methadone, opiates and THC   - Monitor for signs of withdrawal. Supportive care      Hx Chronic back pain    - on Suboxone and gabapentin          Code status: Full   DVT prophylaxis: Lovenox    Disposition: TBD. Home  likely when ready   ----------------------------------------------      CC: Fever      S: Patient seen and examined at bedside this morning in ED. She c/w back pain. requesting methadone       Review of Systems:   A comprehensive review of systems was negative.      O:   Visit Vitals      BP  100/83 (BP 1 Location: Left arm, BP Patient Position: At rest)        Pulse  60     Temp  98.7 ??F (37.1 ??C)     Resp  20     Ht  '5\' 3"'$  (1.6 m)     Wt  84 kg (185 lb 3 oz)     SpO2  95%        BMI  32.80 kg/m??  PHYSICAL EXAM:   Gen: NAD   HEENT: anicteric sclerae, normal conjunctiva, oropharynx clear, MM moist   Neck: supple, trachea midline, no adenopathy   Heart: RRR   Lungs: CTA b/l, non-labored respirations   Abd: soft, NT, ND, BS+, no organomegaly   Extr: warm   Skin: dry, no rash   Neuro:  normal speech, moves all extremities         No intake or output data in the 24 hours ending 07/21/19 1503       Recent labs & imaging reviewed:     Recent Results (from the past 24 hour(s))     CBC WITH AUTOMATED DIFF          Collection Time: 07/21/19  1:27 AM         Result  Value  Ref Range            WBC  9.0  3.6 - 11.0 K/uL       RBC  3.95  3.80 - 5.20 M/uL       HGB  10.8 (L)  11.5 - 16.0 g/dL       HCT  34.2 (L)  35.0 - 47.0 %       MCV  86.6  80.0 - 99.0 FL       MCH  27.3  26.0 - 34.0 PG       MCHC  31.6  30.0 - 36.5 g/dL       RDW  14.9 (H)  11.5 - 14.5 %       PLATELET  220  150 - 400 K/uL       MPV  11.3  8.9 - 12.9 FL       NRBC  0.0  0 PER 100 WBC       ABSOLUTE NRBC  0.00  0.00 - 0.01 K/uL       NEUTROPHILS  85 (H)  32 - 75 %       BAND NEUTROPHILS  6  0 - 6 %       LYMPHOCYTES  7 (L)  12 - 49 %       MONOCYTES  2 (L)  5 - 13 %       EOSINOPHILS  0  0 - 7 %       BASOPHILS  0  0 - 1 %       IMMATURE GRANULOCYTES  0  %       ABS. NEUTROPHILS  8.2 (H)  1.8 - 8.0 K/UL       ABS. LYMPHOCYTES  0.6 (L)  0.8 - 3.5 K/UL       ABS. MONOCYTES  0.2  0.0 - 1.0 K/UL       ABS. EOSINOPHILS  0.0  0.0 - 0.4 K/UL       ABS.  BASOPHILS  0.0  0.0 - 0.1 K/UL       ABS. IMM. GRANS.  0.0  K/UL       DF  MANUAL          PLATELET COMMENTS  Large Platelets          RBC COMMENTS  ANISOCYTOSIS   1+             RBC COMMENTS  VACUOLATED POLYS   OVALOCYTES   1+             METABOLIC PANEL, COMPREHENSIVE          Collection Time: 07/21/19  1:27 AM         Result  Value  Ref Range            Sodium  138  136 - 145 mmol/L       Potassium  3.2 (L)  3.5 - 5.1 mmol/L       Chloride  105  97 - 108 mmol/L       CO2  25  21 - 32 mmol/L       Anion gap  8  5 - 15 mmol/L       Glucose  114 (H)  65 - 100 mg/dL       BUN  9  6 - 20 MG/DL       Creatinine  1.06 (H)  0.55 - 1.02 MG/DL       BUN/Creatinine ratio  8 (L)  12 - 20         GFR est AA  >60  >60 ml/min/1.65m       GFR est non-AA  56 (L)  >60 ml/min/1.764m      Calcium  9.1  8.5 - 10.1 MG/DL       Bilirubin, total  0.6  0.2 - 1.0 MG/DL       ALT (SGPT)  25  12 - 78 U/L       AST (SGOT)  20  15 - 37 U/L       Alk. phosphatase  78  45 - 117 U/L       Protein, total  8.0  6.4 - 8.2 g/dL       Albumin  3.6  3.5 - 5.0 g/dL       Globulin  4.4 (H)  2.0 - 4.0 g/dL       A-G Ratio  0.8 (L)  1.1 - 2.2         CK          Collection Time: 07/21/19  1:27 AM         Result  Value  Ref Range            CK  125  26 - 192 U/L       TROPONIN I          Collection Time: 07/21/19  1:27 AM         Result  Value  Ref Range            Troponin-I, Qt.  <0.05  <0.05 ng/mL       ETHYL ALCOHOL          Collection Time: 07/21/19  1:50 AM         Result  Value  Ref Range            ALCOHOL(ETHYL),SERUM  <10  <10 MG/DL       EKG, 12 LEAD, INITIAL          Collection Time: 07/21/19  2:18 AM         Result  Value  Ref Range            Ventricular Rate  67  BPM       Atrial Rate  67  BPM       P-R Interval  166  ms       QRS Duration  82  ms       Q-T Interval  424  ms       QTC Calculation (Bezet)  448  ms  Calculated P Axis  59  degrees       Calculated R Axis  5  degrees       Calculated T Axis  0  degrees       Diagnosis                  Normal sinus rhythm   Nonspecific T wave abnormality   No previous ECGs available   Confirmed by Larey Seat, M.D., [name] [phone]) on 07/21/2019 11:17:15 AM          URINALYSIS W/MICROSCOPIC          Collection Time: 07/21/19  3:44 AM         Result  Value  Ref Range            Color  YELLOW/STRAW          Appearance  CLEAR  CLEAR         Specific gravity  1.008  1.003 - 1.030         pH (UA)  7.0  5.0 - 8.0         Protein  Negative  NEG mg/dL       Glucose  Negative  NEG mg/dL       Ketone  Negative  NEG mg/dL       Bilirubin  Negative  NEG         Blood  TRACE (A)  NEG         Urobilinogen  1.0  0.2 - 1.0 EU/dL       Nitrites  Negative  NEG         Leukocyte Esterase  TRACE (A)  NEG         WBC  0-4  0 - 4 /hpf       RBC  0-5  0 - 5 /hpf       Epithelial cells  FEW  FEW /lpf       Bacteria  Negative  NEG /hpf       URINE CULTURE HOLD SAMPLE          Collection Time: 07/21/19  3:44 AM       Specimen: Serum; Urine         Result  Value  Ref Range            Urine culture hold                  Urine on hold in Microbiology dept for 2 days.  If unpreserved urine is submitted, it cannot be used for addtional testing after 24 hours, recollection  will be required.       DRUG SCREEN, URINE          Collection Time: 07/21/19  3:44 AM         Result  Value  Ref Range            AMPHETAMINES  Negative  NEG         BARBITURATES  Negative  NEG         BENZODIAZEPINES  Negative  NEG         COCAINE  Positive (A)  NEG         METHADONE  Positive (A)  NEG         OPIATES  Positive (A)  NEG         PCP(PHENCYCLIDINE)  Negative  NEG         THC (TH-CANNABINOL)  Positive (  A)  NEG         Drug screen comment  (NOTE)         LACTIC ACID          Collection Time: 07/21/19  4:22 AM         Result  Value  Ref Range            Lactic acid  1.1  0.4 - 2.0 MMOL/L       PROTHROMBIN TIME + INR          Collection Time: 07/21/19  6:19 AM         Result  Value  Ref Range            INR  1.1  0.9 - 1.1         Prothrombin time  11.5 (H)   9.0 - 11.1 sec       PTT          Collection Time: 07/21/19  6:19 AM         Result  Value  Ref Range            aPTT  26.8  22.1 - 32.0 sec       aPTT, therapeutic range       58.0 - 77.0 SECS       FIBRINOGEN          Collection Time: 07/21/19  6:19 AM         Result  Value  Ref Range            Fibrinogen  254  200 - 475 mg/dL       LD          Collection Time: 07/21/19  6:19 AM         Result  Value  Ref Range            LD  206  81 - 246 U/L       FERRITIN          Collection Time: 07/21/19  6:19 AM         Result  Value  Ref Range            Ferritin  49  26 - 388 NG/ML       D DIMER          Collection Time: 07/21/19  6:19 AM         Result  Value  Ref Range            D-dimer  1.86 (H)  0.00 - 0.65 mg/L FEU       PROCALCITONIN          Collection Time: 07/21/19  6:19 AM         Result  Value  Ref Range            Procalcitonin  19.55  ng/mL       SARS-COV-2          Collection Time: 07/21/19  6:19 AM         Result  Value  Ref Range            Specimen source  Nasopharyngeal          SARS-CoV-2  PENDING         SARS-CoV-2  PENDING         Specimen source  Nasopharyngeal          COVID-19 rapid test  PENDING  Specimen type  NP Swab          Health status  PENDING         COVID-19  PENDING         SAMPLES BEING HELD          Collection Time: 07/21/19  6:24 AM         Result  Value  Ref Range            SAMPLES BEING HELD  1LAV         COMMENT                  Add-on orders for these samples will be processed based on acceptable specimen integrity and analyte stability, which may vary by analyte.          Recent Labs           07/21/19   0127     WBC  9.0     HGB  10.8*     HCT  34.2*        PLT  220          Recent Labs           07/21/19   0127     NA  138     K  3.2*     CL  105     CO2  25     BUN  9     CREA  1.06*     GLU  114*        CA  9.1          Recent Labs           07/21/19   0127     ALT  25     AP  78     TBILI  0.6     TP  8.0     ALB  3.6        GLOB  4.4*          Recent Labs            07/21/19   0619     INR  1.1     PTP  11.5*        APTT  26.8           Recent Labs           07/21/19   0619        FERR  42           Lab Results         Component  Value  Date/Time            Folate  5.4  03/14/2019 05:47 AM         No results for input(s): PH, PCO2, PO2 in the last 72 hours.     Recent Labs           07/21/19   0127     CPK  125        TROIQ  <0.05        No results found for: CHOL, CHOLX, CHLST, CHOLV, HDL, HDLP, LDL, LDLC, DLDLP, TGLX, TRIGL, TRIGP, CHHD, CHHDX     Lab Results         Component  Value  Date/Time            Glucose (POC)  144 (H)  03/14/2019 09:56 AM  Lab Results         Component  Value  Date/Time            Color  YELLOW/STRAW  07/21/2019 03:44 AM       Appearance  CLEAR  07/21/2019 03:44 AM       Specific gravity  1.008  07/21/2019 03:44 AM       Specific gravity  1.029  09/14/2009 08:45 PM       pH (UA)  7.0  07/21/2019 03:44 AM       Protein  Negative  07/21/2019 03:44 AM       Glucose  Negative  07/21/2019 03:44 AM       Ketone  Negative  07/21/2019 03:44 AM       Bilirubin  Negative  07/21/2019 03:44 AM       Urobilinogen  1.0  07/21/2019 03:44 AM       Nitrites  Negative  07/21/2019 03:44 AM       Leukocyte Esterase  TRACE (A)  07/21/2019 03:44 AM       Epithelial cells  FEW  07/21/2019 03:44 AM       Bacteria  Negative  07/21/2019 03:44 AM       WBC  0-4  07/21/2019 03:44 AM            RBC  0-5  07/21/2019 03:44 AM           Med list reviewed     Current Facility-Administered Medications          Medication  Dose  Route  Frequency           ?  sodium chloride (NS) flush 5-40 mL   5-40 mL  IntraVENous  Q8H     ?  sodium chloride (NS) flush 5-40 mL   5-40 mL  IntraVENous  PRN     ?  acetaminophen (TYLENOL) tablet 650 mg   650 mg  Oral  Q6H PRN          Or           ?  acetaminophen (TYLENOL) suppository 650 mg   650 mg  Rectal  Q6H PRN     ?  polyethylene glycol (MIRALAX) packet 17 g   17 g  Oral  DAILY PRN     ?  promethazine (PHENERGAN) tablet 12.5 mg   12.5 mg   Oral  Q6H PRN          Or           ?  ondansetron (ZOFRAN) injection 4 mg   4 mg  IntraVENous  Q6H PRN     ?  enoxaparin (LOVENOX) injection 40 mg   40 mg  SubCUTAneous  DAILY     ?  piperacillin-tazobactam (ZOSYN) 3.375 g in 0.9% sodium chloride (MBP/ADV) 100 mL   3.375 g  IntraVENous  Q8H     ?  vancomycin pharmacy dosing     Other  Rx Dosing/Monitoring     ?  vancomycin (VANCOCIN) 1250 mg in NS 250 ml infusion   1,250 mg  IntraVENous  Q16H           ?  Marland KitchenPHARMACY TO SUBSTITUTE PER PROTOCOL (Reordered from: buprenorphine-naloxone (SUBOXONE) 8-2 mg film sublingaul film)       Per Protocol           ?  gabapentin (NEURONTIN) capsule 800 mg   800 mg  Oral  TID     ?  meloxicam (MOBIC) tablet 7.5 mg   7.5 mg  Oral  DAILY           ?  mirtazapine (REMERON SOL-TAB) disintegrating tablet 15 mg   15 mg  Oral  QHS          Current Outpatient Medications        Medication  Sig         ?  meloxicam (MOBIC) 7.5 mg tablet  Indications: received today     ?  rizatriptan (MAXALT-MLT) 10 mg disintegrating tablet       ?  topiramate (TOPAMAX) 50 mg tablet       ?  buprenorphine-naloxone (SUBOXONE) 8-2 mg film sublingaul film  1 Film by SubLINGual route three (3) times daily.     ?  DULoxetine (CYMBALTA) 60 mg capsule  Take 60 mg by mouth daily.     ?  famotidine (PEPCID) 20 mg tablet  Take 20 mg by mouth two (2) times a day.     ?  gabapentin (NEURONTIN) 800 mg tablet  Take 800 mg by mouth three (3) times daily.     ?  mirtazapine (REMERON SOL-TAB) 15 mg disintegrating tablet  Take 15 mg by mouth nightly.     ?  naproxen (NAPROSYN) 375 mg tablet  Take 375 mg by mouth two (2) times daily (with meals).         ?  risperiDONE (RisperDAL) 1 mg tablet  Take 1 mg by mouth daily.           Care Plan discussed with:   Patient/Family and Nurse      Cheryll Cockayne, MD   Internal Medicine   Date of Service: 07/21/2019

## 2019-07-21 NOTE — ED Provider Notes (Signed)
ED  Provider Notes by Luanna Cole, NP at 07/21/19 0109                Author: Luanna Cole, NP  Service: Emergency Medicine  Author Type: Nurse Practitioner       Filed: 07/21/19 0249  Date of Service: 07/21/19 0109  Status: Attested           Editor: Luanna Cole, NP (Nurse Practitioner)  Cosigner: Zollie Pee, MD at 07/21/19 0533          Attestation signed by Zollie Pee, MD at 07/21/19 0533          I personally saw and examined the patient.  I have reviewed and agree with the MLP's findings, including all diagnostic interpretations, and plans as written.    I was present during the key portions of separately billed procedures.        IV drug user with fever and faint murmur on exam.  Labs/vitals are all ok. Will admit for endocarditis work up.          Zollie Pee, MD   Perfect Serve Consult for Admission   5:31 AM      ED Room Number: ER08/08   Patient Name and age:  Anita Bell 48 y.o.  female   Working Diagnosis:    1.  Substance abuse ([place])       2.  Fever, unspecified fever cause       3.  Heart murmur                COVID-19 Suspicion:  Other   Sepsis present:  no  Reassessment needed: no   Code Status:  Full Code   Readmission: no   Isolation Requirements:  Other   Recommended Level of Care:  telemetry   Department:[facility] ED - [phone]   Other:  48 year old female, IV drug abuser, presents with fever 101. No obvious source, does have faint murmur on exam which she states she's never been told she has.  Vitals and lactate are ok.Concern if for endocarditis- I have not started antibiotics,   blood cultures sent.                                     This is a 48 year old female with no reported past medical history (see documentated PMH below) who presents the  ER today for chief complaint of fever.  Patient states that she first noticed her fever several hours ago after waking up from a nap this evening.   Patient noted to be very drowsy and minimally responsive, and therefore cannot  provide much information  related to the HPI and ROS.  She reports using cocaine and IV heroin around 7 PM this evening.     She denies any other illicit drug use or alcohol use.  Patient also denies suicidal ideations.                  Past Medical History:        Diagnosis  Date         ?  ADHD       ?  Asthma       ?  Depression       ?  Fibromyalgia       ?  Gastroenteritis       ?  Hydradenitis  suppurativa         ?  Osteoarthritis       ?  Other ill-defined conditions(799.89)            chronic back pain         ?  Psychiatric disorder               Past Surgical History:         Procedure  Laterality  Date          ?  HX GYN              tubes tided               Family History:         Problem  Relation  Age of Onset          ?  Alcohol abuse  Neg Hx       ?  Arthritis-rheumatoid  Neg Hx       ?  Asthma  Neg Hx       ?  Bleeding Prob  Neg Hx       ?  Cancer  Neg Hx       ?  Diabetes  Neg Hx       ?  Elevated Lipids  Neg Hx       ?  Headache  Neg Hx       ?  Heart Disease  Neg Hx       ?  Hypertension  Neg Hx       ?  Lung Disease  Neg Hx       ?  Migraines  Neg Hx       ?  Psychiatric Disorder  Neg Hx       ?  Stroke  Neg Hx            ?  Mental Retardation  Neg Hx               Social History          Socioeconomic History         ?  Marital status:  MARRIED              Spouse name:  Not on file         ?  Number of children:  Not on file     ?  Years of education:  Not on file     ?  Highest education level:  Not on file       Occupational History        ?  Not on file       Social Needs         ?  Financial resource strain:  Not on file        ?  Food insecurity              Worry:  Not on file         Inability:  Not on file        ?  Transportation needs              Medical:  Not on file         Non-medical:  Not on file       Tobacco Use         ?  Smoking status:  Never Smoker     ?  Smokeless tobacco:  Never Used  Substance and Sexual Activity         ?  Alcohol use:  No     ?  Drug  use:  Yes              Types:  Prescription, Opiates, Cocaine, Heroin         ?  Sexual activity:  Not on file       Lifestyle        ?  Physical activity              Days per week:  Not on file         Minutes per session:  Not on file         ?  Stress:  Not on file       Relationships        ?  Social Health visitor on phone:  Not on file         Gets together:  Not on file         Attends religious service:  Not on file         Active member of club or organization:  Not on file         Attends meetings of clubs or organizations:  Not on file         Relationship status:  Not on file        ?  Intimate partner violence              Fear of current or ex partner:  Not on file         Emotionally abused:  Not on file         Physically abused:  Not on file         Forced sexual activity:  Not on file        Other Topics  Concern        ?  Not on file       Social History Narrative          ** Merged History Encounter **                         ALLERGIES: Aspirin; Aspirin; Ibuprofen; and Ibuprofen      Review of Systems    Constitutional: Positive for chills and fever .    HENT: Negative for sore throat.     Eyes: Negative for visual disturbance.    Respiratory: Negative for cough and shortness of breath.     Cardiovascular: Negative for palpitations.    Gastrointestinal: Negative for vomiting.    Genitourinary: Negative for dysuria.    Musculoskeletal: Negative for myalgias.    Skin: Negative for rash.    Neurological: Negative for dizziness.    Psychiatric/Behavioral: Negative for dysphoric mood.            Vitals:           07/21/19 0014  07/21/19 0200         BP:  (!) 154/92  119/69     Pulse:  98  67     Resp:  16  12     Temp:  (!) 100.9 ??F (38.3 ??C)       SpO2:  100%  98%     Weight:  84 kg (185 lb 3 oz)  Height:  '5\' 3"'$  (1.6 m)                  Physical Exam   Vitals signs and nursing note reviewed.   Constitutional:        General: She is not in acute distress.     Appearance:  Normal appearance. She is not ill-appearing.    HENT:       Head: Normocephalic and atraumatic.      Comments: Pupils 1 - 2 mm and reactive bilaterally     Right Ear: External ear normal.       Left Ear: External ear normal.      Nose: Nose normal.      Mouth/Throat:      Mouth: Mucous membranes are dry.      Pharynx:  Oropharynx is clear.   Eyes :       General: No scleral icterus.     Extraocular Movements: Extraocular movements intact.      Conjunctiva/sclera: Conjunctivae normal.      Pupils: Pupils are equal, round, and reactive to light.   Neck:       Musculoskeletal: Normal range of motion and neck supple. No neck rigidity or muscular tenderness.    Cardiovascular:       Rate and Rhythm: Normal rate and regular rhythm.      Pulses: Normal pulses.            Radial pulses are 2+ on the right side and 2+  on the left side.      Heart sounds: Murmur present.   Pulmonary:       Effort: Bradypnea present.      Breath sounds: Normal breath sounds .    Abdominal:      General: Abdomen is flat. Bowel sounds are normal.      Palpations: Abdomen is soft.     Musculoskeletal: Normal range of motion.    Skin:      General: Skin is warm and dry.      Coloration: Skin is not pale.    Neurological:       General: No focal deficit present.      Mental Status: She is lethargic and  disoriented.      GCS: GCS eye subscore is 3. GCS verbal subscore is  4. GCS motor subscore is 6.   Psychiatric:         Mood and Affect: Mood normal.         Behavior: Behavior  normal.         Thought Content: Thought content normal.         Judgment: Judgment normal.              MDM        VITAL SIGNS:   Patient Vitals for the past 4 hrs:            Temp  Pulse  Resp  BP  SpO2            07/21/19 0200  --  67  12  119/69  98 %            07/21/19 0014  (!) 100.9 ??F (38.3 ??C)  98  16  (!) 154/92  100 %             LABS:     Recent Results (from the past 6 hour(s))     CBC WITH AUTOMATED DIFF  Collection Time: 07/21/19  1:27 AM          Result  Value  Ref Range            WBC  9.0  3.6 - 11.0 K/uL       RBC  3.95  3.80 - 5.20 M/uL       HGB  10.8 (L)  11.5 - 16.0 g/dL       HCT  34.2 (L)  35.0 - 47.0 %       MCV  86.6  80.0 - 99.0 FL       MCH  27.3  26.0 - 34.0 PG       MCHC  31.6  30.0 - 36.5 g/dL       RDW  14.9 (H)  11.5 - 14.5 %       PLATELET  220  150 - 400 K/uL       MPV  11.3  8.9 - 12.9 FL       NRBC  0.0  0 PER 100 WBC       ABSOLUTE NRBC  0.00  0.00 - 0.01 K/uL       NEUTROPHILS  85 (H)  32 - 75 %       BAND NEUTROPHILS  6  0 - 6 %       LYMPHOCYTES  7 (L)  12 - 49 %       MONOCYTES  2 (L)  5 - 13 %       EOSINOPHILS  0  0 - 7 %       BASOPHILS  0  0 - 1 %       IMMATURE GRANULOCYTES  0  %       ABS. NEUTROPHILS  8.2 (H)  1.8 - 8.0 K/UL       ABS. LYMPHOCYTES  0.6 (L)  0.8 - 3.5 K/UL       ABS. MONOCYTES  0.2  0.0 - 1.0 K/UL       ABS. EOSINOPHILS  0.0  0.0 - 0.4 K/UL       ABS. BASOPHILS  0.0  0.0 - 0.1 K/UL       ABS. IMM. GRANS.  0.0  K/UL       DF  MANUAL          PLATELET COMMENTS  Large Platelets          RBC COMMENTS  ANISOCYTOSIS   1+             RBC COMMENTS  VACUOLATED POLYS   OVALOCYTES   1+             METABOLIC PANEL, COMPREHENSIVE          Collection Time: 07/21/19  1:27 AM         Result  Value  Ref Range            Sodium  138  136 - 145 mmol/L       Potassium  3.2 (L)  3.5 - 5.1 mmol/L       Chloride  105  97 - 108 mmol/L       CO2  25  21 - 32 mmol/L       Anion gap  8  5 - 15 mmol/L       Glucose  114 (H)  65 - 100 mg/dL       BUN  9  6 - 20 MG/DL  Creatinine  1.06 (H)  0.55 - 1.02 MG/DL       BUN/Creatinine ratio  8 (L)  12 - 20         GFR est AA  >60  >60 ml/min/1.28m       GFR est non-AA  56 (L)  >60 ml/min/1.778m      Calcium  9.1  8.5 - 10.1 MG/DL       Bilirubin, total  0.6  0.2 - 1.0 MG/DL       ALT (SGPT)  25  12 - 78 U/L       AST (SGOT)  20  15 - 37 U/L       Alk. phosphatase  78  45 - 117 U/L       Protein, total  8.0  6.4 - 8.2 g/dL       Albumin  3.6  3.5 - 5.0 g/dL       Globulin  4.4 (H)  2.0 -  4.0 g/dL       A-G Ratio  0.8 (L)  1.1 - 2.2         CK          Collection Time: 07/21/19  1:27 AM         Result  Value  Ref Range            CK  125  26 - 192 U/L       EKG, 12 LEAD, INITIAL          Collection Time: 07/21/19  2:18 AM         Result  Value  Ref Range            Ventricular Rate  67  BPM       Atrial Rate  67  BPM       P-R Interval  166  ms       QRS Duration  82  ms       Q-T Interval  424  ms       QTC Calculation (Bezet)  448  ms       Calculated P Axis  59  degrees       Calculated R Axis  5  degrees       Calculated T Axis  0  degrees       Diagnosis                 Normal sinus rhythm   Nonspecific T wave abnormality   No previous ECGs available               IMAGING:     XR CHEST PORT       Final Result     IMPRESSION:     No acute process.                     Medications During Visit:     Medications       sodium chloride 0.9 % bolus infusion 1,000 mL (1,000 mL IntraVENous New Bag 07/21/19 0156)       naloxone (NARCAN) injection 0.4 mg (has no administration in time range)              DECISION MAKING:   Airyanna JeMackiewiczs a 89.o.  female who comes in as above.    2:49 AM   Patient remains very drowsy and provides very little information related to her clinical presentation.  She last used cocaine and urine this evening around 7 PM. She is alert and oriented x3, but is very lethargic and slow to respond.            IMPRESSION:      1.  Substance abuse ([place])            DISPOSITION:              Current Discharge Medication List                     Follow-up Information               Follow up With  Specialties  Details  Why  Contact Info              See list of free clinics for follow up care                           The patient is asked to follow-up with their primary care provider in the next several days.  They are to call tomorrow for an appointment.  The patient is asked to return promptly  for any increased concerns or worsening of symptoms.  They can return to this emergency  department or any other emergency department.

## 2019-07-21 NOTE — H&P (Signed)
H&P by Revonda Standard,  MD at 07/21/19 [phone]                Author: Revonda Standard, MD  Service: Internal Medicine  Author Type: Physician       Filed: 07/21/19 0737  Date of Service: 07/21/19 0621  Status: Signed          Editor: Revonda Standard, MD (Physician)                    [facility] Adult  Hospitalist Group   History and Physical      Primary Care Provider: None   Date of Service:  07/21/2019        Subjective:        Anita Bell is a 48 y.o.  female past medical history significant for polysubstance abuse, psychiatric disorders presented emergency room complaining on a fever.  She stated that today she started having fevers and chills at  home.  Unknown how high it was temperature at home.  She reports using cocaine and heroin around 7 PM and afterward she started feeling feverish.  She denies any symptoms such as headache, cough, shortness of breath, chest pain, sore throat, runny nose,  nausea, vomiting, diarrhea, urinary symptom.  On arrival to the emergency room patient was found to be febrile with a temperature of 100.9 lethargic but arousable.  Work-up overall unremarkable.  Given her risk factor admission was requested.  Patient  awake and alert at this time.         Review of Systems:      Review of Systems    Constitutional: Positive for chills and fever . Negative for malaise/fatigue and weight loss.    HENT: Negative for congestion, ear discharge and hearing loss.     Eyes: Negative for blurred vision and double vision.    Respiratory: Negative for cough, sputum production, shortness of breath and wheezing.     Cardiovascular: Negative for chest pain, palpitations, orthopnea, leg swelling and PND.    Gastrointestinal: Negative for abdominal pain, constipation, diarrhea, melena, nausea and vomiting.    Genitourinary: Negative for dysuria, frequency and urgency.    Musculoskeletal: Negative for back pain, joint pain and myalgias.    Skin: Negative for itching and rash.     Neurological: Negative for dizziness, sensory change, speech change, focal weakness, weakness and headaches.    Endo/Heme/Allergies: Negative for polydipsia. Does not bruise/bleed easily.            Past Medical History:        Diagnosis  Date         ?  ADHD       ?  Asthma       ?  Depression       ?  Fibromyalgia       ?  Gastroenteritis       ?  Hydradenitis            suppurativa         ?  Osteoarthritis       ?  Other ill-defined conditions(799.89)            chronic back pain         ?  Psychiatric disorder             Past Surgical History:         Procedure  Laterality  Date          ?  HX GYN              tubes tided          Prior to Admission medications             Medication  Sig  Start Date  End Date  Taking?  Authorizing Provider            meloxicam (MOBIC) 7.5 mg tablet  Indications: received today  03/24/19      Provider, Historical     rizatriptan (MAXALT-MLT) 10 mg disintegrating tablet    03/24/19      Provider, Historical     topiramate (TOPAMAX) 50 mg tablet    03/24/19      Provider, Historical     buprenorphine-naloxone (SUBOXONE) 8-2 mg film sublingaul film  1 Film by SubLINGual route three (3) times daily.        Provider, Historical     DULoxetine (CYMBALTA) 60 mg capsule  Take 60 mg by mouth daily.        Provider, Historical     famotidine (PEPCID) 20 mg tablet  Take 20 mg by mouth two (2) times a day.        Provider, Historical     gabapentin (NEURONTIN) 800 mg tablet  Take 800 mg by mouth three (3) times daily.        Provider, Historical     mirtazapine (REMERON SOL-TAB) 15 mg disintegrating tablet  Take 15 mg by mouth nightly.        Provider, Historical     naproxen (NAPROSYN) 375 mg tablet  Take 375 mg by mouth two (2) times daily (with meals).        Provider, Historical            risperiDONE (RisperDAL) 1 mg tablet  Take 1 mg by mouth daily.        Provider, Historical          Allergies        Allergen  Reactions         ?  Aspirin  Nausea and Vomiting     ?  Aspirin  Hives     ?   Ibuprofen  Other (comments)             Upsets her stomach         ?  Ibuprofen  Other (comments)             Abdominal pain              Family History         Problem  Relation  Age of Onset          ?  Alcohol abuse  Neg Hx       ?  Arthritis-rheumatoid  Neg Hx       ?  Asthma  Neg Hx       ?  Bleeding Prob  Neg Hx       ?  Cancer  Neg Hx       ?  Diabetes  Neg Hx       ?  Elevated Lipids  Neg Hx       ?  Headache  Neg Hx       ?  Heart Disease  Neg Hx       ?  Hypertension  Neg Hx       ?  Lung Disease  Neg Hx       ?  Migraines  Neg Hx       ?  Psychiatric Disorder  Neg Hx       ?  Stroke  Neg Hx            ?  Mental Retardation  Neg Hx              SOCIAL HISTORY:   Patient resides at home   Patient ambulates independently    Smoking history: none   Alcohol history: occasional   Illicit drug use: cocaine, marijuana, heroine              Objective:           Physical Exam:    Patient Vitals for the past 12 hrs:            Temp  Pulse  Resp  BP  SpO2            07/21/19 0415  --  66  25  (!) 105/57  96 %            07/21/19 0400  --  67  22  109/62  96 %     07/21/19 0345  --  83  15  136/86  97 %     07/21/19 0330  --  74  11  125/79  96 %     07/21/19 0315  --  68  12  116/66  96 %     07/21/19 0300  --  70  15  127/73  98 %     07/21/19 0245  99.1 ??F (37.3 ??C)  64  12  118/75  100 %     07/21/19 0230  --  65  11  (!) 111/59  95 %     07/21/19 0215  --  71  11  123/72  97 %     07/21/19 0200  --  67  12  119/69  98 %            07/21/19 0014  (!) 100.9 ??F (38.3 ??C)  98  16  (!) 154/92  100 %        GEN APPEARANCE: Patient resting in bed in NAD   HEENT: Conjunctiva Clear   CVS: RRR, 2/6 systolic murmur best heard left sternal border   LUNGS: CTAB; No Wheezes; No Rhonchi: No rales   ABD: Soft; No TTP; + Normoactive BS   EXT: WWP, no edema    Skin exam: No gross lesions noted on exposed skin surfaces   MENTAL STATUS: Answers questions appropriately, responds to commands.    NEURO: Cranial nerve exam: EOMI, CN V  sensory/motor intact, no facial asymmetry noted, patient able to hearl, uvula midline, able to move tongue left/right. No gross motor or sensory deficits         Data Review:      Recent Results (from the past 24 hour(s))     CBC WITH AUTOMATED DIFF          Collection Time: 07/21/19  1:27 AM         Result  Value  Ref Range            WBC  9.0  3.6 - 11.0 K/uL       RBC  3.95  3.80 - 5.20 M/uL       HGB  10.8 (L)  11.5 - 16.0 g/dL       HCT  34.2 (L)  35.0 -  47.0 %       MCV  86.6  80.0 - 99.0 FL       MCH  27.3  26.0 - 34.0 PG       MCHC  31.6  30.0 - 36.5 g/dL       RDW  14.9 (H)  11.5 - 14.5 %       PLATELET  220  150 - 400 K/uL       MPV  11.3  8.9 - 12.9 FL       NRBC  0.0  0 PER 100 WBC       ABSOLUTE NRBC  0.00  0.00 - 0.01 K/uL       NEUTROPHILS  85 (H)  32 - 75 %       BAND NEUTROPHILS  6  0 - 6 %       LYMPHOCYTES  7 (L)  12 - 49 %       MONOCYTES  2 (L)  5 - 13 %       EOSINOPHILS  0  0 - 7 %       BASOPHILS  0  0 - 1 %       IMMATURE GRANULOCYTES  0  %       ABS. NEUTROPHILS  8.2 (H)  1.8 - 8.0 K/UL       ABS. LYMPHOCYTES  0.6 (L)  0.8 - 3.5 K/UL       ABS. MONOCYTES  0.2  0.0 - 1.0 K/UL       ABS. EOSINOPHILS  0.0  0.0 - 0.4 K/UL       ABS. BASOPHILS  0.0  0.0 - 0.1 K/UL       ABS. IMM. GRANS.  0.0  K/UL       DF  MANUAL          PLATELET COMMENTS  Large Platelets          RBC COMMENTS  ANISOCYTOSIS   1+             RBC COMMENTS  VACUOLATED POLYS   OVALOCYTES   1+             METABOLIC PANEL, COMPREHENSIVE          Collection Time: 07/21/19  1:27 AM         Result  Value  Ref Range            Sodium  138  136 - 145 mmol/L       Potassium  3.2 (L)  3.5 - 5.1 mmol/L       Chloride  105  97 - 108 mmol/L       CO2  25  21 - 32 mmol/L       Anion gap  8  5 - 15 mmol/L       Glucose  114 (H)  65 - 100 mg/dL       BUN  9  6 - 20 MG/DL       Creatinine  1.06 (H)  0.55 - 1.02 MG/DL       BUN/Creatinine ratio  8 (L)  12 - 20         GFR est AA  >60  >60 ml/min/1.19m       GFR est non-AA  56 (L)  >60  ml/min/1.719m      Calcium  9.1  8.5 - 10.1 MG/DL  Bilirubin, total  0.6  0.2 - 1.0 MG/DL       ALT (SGPT)  25  12 - 78 U/L       AST (SGOT)  20  15 - 37 U/L       Alk. phosphatase  78  45 - 117 U/L       Protein, total  8.0  6.4 - 8.2 g/dL       Albumin  3.6  3.5 - 5.0 g/dL       Globulin  4.4 (H)  2.0 - 4.0 g/dL       A-G Ratio  0.8 (L)  1.1 - 2.2         CK          Collection Time: 07/21/19  1:27 AM         Result  Value  Ref Range            CK  125  26 - 192 U/L       TROPONIN I          Collection Time: 07/21/19  1:27 AM         Result  Value  Ref Range            Troponin-I, Qt.  <0.05  <0.05 ng/mL       ETHYL ALCOHOL          Collection Time: 07/21/19  1:50 AM         Result  Value  Ref Range            ALCOHOL(ETHYL),SERUM  <10  <10 MG/DL       EKG, 12 LEAD, INITIAL          Collection Time: 07/21/19  2:18 AM         Result  Value  Ref Range            Ventricular Rate  67  BPM       Atrial Rate  67  BPM       P-R Interval  166  ms       QRS Duration  82  ms       Q-T Interval  424  ms       QTC Calculation (Bezet)  448  ms       Calculated P Axis  59  degrees       Calculated R Axis  5  degrees       Calculated T Axis  0  degrees       Diagnosis                 Normal sinus rhythm   Nonspecific T wave abnormality   No previous ECGs available          URINALYSIS W/MICROSCOPIC          Collection Time: 07/21/19  3:44 AM         Result  Value  Ref Range            Color  YELLOW/STRAW          Appearance  CLEAR  CLEAR         Specific gravity  1.008  1.003 - 1.030         pH (UA)  7.0  5.0 - 8.0         Protein  Negative  NEG mg/dL       Glucose  Negative  NEG mg/dL  Ketone  Negative  NEG mg/dL       Bilirubin  Negative  NEG         Blood  TRACE (A)  NEG         Urobilinogen  1.0  0.2 - 1.0 EU/dL       Nitrites  Negative  NEG         Leukocyte Esterase  TRACE (A)  NEG         WBC  0-4  0 - 4 /hpf       RBC  0-5  0 - 5 /hpf       Epithelial cells  FEW  FEW /lpf       Bacteria  Negative  NEG /hpf        URINE CULTURE HOLD SAMPLE          Collection Time: 07/21/19  3:44 AM       Specimen: Serum; Urine         Result  Value  Ref Range            Urine culture hold                  Urine on hold in Microbiology dept for 2 days.  If unpreserved urine is submitted, it cannot be used for addtional testing after 24 hours, recollection  will be required.       DRUG SCREEN, URINE          Collection Time: 07/21/19  3:44 AM         Result  Value  Ref Range            AMPHETAMINES  Negative  NEG         BARBITURATES  Negative  NEG         BENZODIAZEPINES  Negative  NEG         COCAINE  Positive (A)  NEG         METHADONE  Positive (A)  NEG         OPIATES  Positive (A)  NEG         PCP(PHENCYCLIDINE)  Negative  NEG         THC (TH-CANNABINOL)  Positive (A)  NEG         Drug screen comment  (NOTE)         LACTIC ACID          Collection Time: 07/21/19  4:22 AM         Result  Value  Ref Range            Lactic acid  1.1  0.4 - 2.0 MMOL/L       SARS-COV-2          Collection Time: 07/21/19  6:19 AM         Result  Value  Ref Range            Specimen source  Nasopharyngeal          SARS-CoV-2  PENDING         SARS-CoV-2  PENDING         Specimen source  Nasopharyngeal          COVID-19 rapid test  PENDING         Specimen type  NP Swab          Health status  PENDING         COVID-19  PENDING  SAMPLES BEING HELD          Collection Time: 07/21/19  6:24 AM         Result  Value  Ref Range            SAMPLES BEING HELD  1LAV         COMMENT                  Add-on orders for these samples will be processed based on acceptable specimen integrity and analyte stability, which may vary by analyte.        Xr Chest Port      Result Date: 07/21/2019   IMPRESSION: No acute process.           Assessment:        Active Problems:     Fever (07/21/2019)         48 year old female with past medical history significant for polysubstance abuse presented emergency room for fever of unknown origin.        Plan:        1. Fever (POA): known  etiology. concerned for bacteremia/endocarditis given h/o IV drug use and new murmur vs viral infection.    -COVID 19 test sent   -Check respiratory panel   -Follow-up blood culture.   - empiric treatment with Vanco IVand zosyn IV   - Sepsis assessment completed.  Patient does not meet sepsis criteria at this time.   -Placed??on droplet precaution plus contact         2. Systolic Murmur: new finding as per pt report. ? Endocarditis   - Echocardiogram ordered      3. Polysubstance abuse:   - Monitor for signs of withdrawal. Supportive care   - On methadone. Dose needs to be verified.       DVT prophy: lovenox    Code status: full code         FUNCTIONAL STATUS PRIOR TO HOSPITALIZATION Ambulates Independently         Signed By:  Revonda Standard, MD           July 21, 2019

## 2019-07-21 NOTE — ED Notes (Signed)
 TRANSFER - OUT REPORT:    Verbal report given to Melanie, RN(name) on [employer]  being transferred to [facility]) for routine progression of care       Report consisted of patient's Situation, Background, Assessment and   Recommendations(SBAR).     Information from the following report(s) SBAR, ED Summary, Procedure Summary, Intake/Output, MAR, Recent Results and Cardiac Rhythm NSR-SB was reviewed with the receiving nurse.    Lines:   Peripheral IV 07/21/19 Left Hand (Active)   Site Assessment Clean, dry, & intact 07/21/19 1630   Phlebitis Assessment 0 07/21/19 1630   Infiltration Assessment 0 07/21/19 1630   Dressing Status Clean, dry, & intact 07/21/19 1630   Dressing Type Transparent;Tape 07/21/19 1630   Hub Color/Line Status Yellow;Infusing;Patent 07/21/19 1630   Action Taken Open ports on tubing capped 07/21/19 1230   Alcohol Cap Used No 07/21/19 1630        Opportunity for questions and clarification was provided.      Patient transported with:   Registered Nurse

## 2019-07-21 NOTE — ED Notes (Signed)
Pt. Arrived from home via EMS with c/o of body aches and fever after using Cocaine ad Heroin at 1900 9/28. Pt. Very drowsy in triage but states this is because she worked all day and is tired. Pt. States her husband said she vomited but she doesn't remember. Pt. States she is on Methadone.

## 2019-07-21 NOTE — Progress Notes (Addendum)
Hospitalist Progress Note      Hospital summary: Anita Bell is a 48 y.o. female past medical history significant for polysubstance abuse, psychiatric disorders presented emergency room complaining on a fever.  She stated that today she started having fevers and chills at home.  Unknown how high it was temperature at home.  She reports using cocaine and heroin around 7 PM and afterward she started feeling feverish.  She denies any symptoms such as headache, cough, shortness of breath, chest pain, sore throat, runny nose, nausea, vomiting, diarrhea, urinary symptom.  On arrival to the emergency room patient was found to be febrile with a temperature of 100.9 lethargic but arousable.  Work-up overall unremarkable.  Given her risk factor admission was requested.  Patient awake and alert at this time 07/21/2019      Assessment/Plan:  Fever (POA):??known etiology.??  - concern for bacteremia/endocarditis given hx IV drug use and new murmur vs viral infection.   ???COVID 19 test pending   ???Blood culture pending   - normal wbc, lactic. Elevated  pro cal 19.55   - CXR: no acute process   ??? empiric treatment with IV Vanco and zosyn  ??  ??Systolic Murmur: new finding as per pt report. ? Endocarditis  - Echocardiogram ordered  ??  Polysubstance abuse:  - UDS: positive for cocaine, methadone, opiates and THC  - Monitor for signs of withdrawal. Supportive care    Hx Chronic back pain   - on Suboxone and gabapentin       Code status: Full  DVT prophylaxis: Lovenox   Disposition: TBD. Home likely when ready  ----------------------------------------------    CC: Fever    S: Patient seen and examined at bedside this morning in ED. She c/w back pain. requesting methadone     Review of Systems:  A comprehensive review of systems was negative.    O:  Visit Vitals  BP 100/83 (BP 1 Location: Left arm, BP Patient Position: At rest)   Pulse 60   Temp 98.7 ??F (37.1 ??C)   Resp 20   Ht 5' 3"  (1.6 m)    Wt 84 kg (185 lb 3 oz)   SpO2 95%   BMI 32.80 kg/m??       PHYSICAL EXAM:  Gen: NAD  HEENT: anicteric sclerae, normal conjunctiva, oropharynx clear, MM moist  Neck: supple, trachea midline, no adenopathy  Heart: RRR  Lungs: CTA b/l, non-labored respirations  Abd: soft, NT, ND, BS+, no organomegaly  Extr: warm  Skin: dry, no rash  Neuro:  normal speech, moves all extremities      No intake or output data in the 24 hours ending 07/21/19 1503     Recent labs & imaging reviewed:  Recent Results (from the past 24 hour(s))   CBC WITH AUTOMATED DIFF    Collection Time: 07/21/19  1:27 AM   Result Value Ref Range    WBC 9.0 3.6 - 11.0 K/uL    RBC 3.95 3.80 - 5.20 M/uL    HGB 10.8 (L) 11.5 - 16.0 g/dL    HCT 34.2 (L) 35.0 - 47.0 %    MCV 86.6 80.0 - 99.0 FL    MCH 27.3 26.0 - 34.0 PG    MCHC 31.6 30.0 - 36.5 g/dL    RDW 14.9 (H) 11.5 - 14.5 %    PLATELET 220 150 - 400 K/uL    MPV 11.3 8.9 - 12.9 FL    NRBC 0.0 0 PER 100 WBC  ABSOLUTE NRBC 0.00 0.00 - 0.01 K/uL    NEUTROPHILS 85 (H) 32 - 75 %    BAND NEUTROPHILS 6 0 - 6 %    LYMPHOCYTES 7 (L) 12 - 49 %    MONOCYTES 2 (L) 5 - 13 %    EOSINOPHILS 0 0 - 7 %    BASOPHILS 0 0 - 1 %    IMMATURE GRANULOCYTES 0 %    ABS. NEUTROPHILS 8.2 (H) 1.8 - 8.0 K/UL    ABS. LYMPHOCYTES 0.6 (L) 0.8 - 3.5 K/UL    ABS. MONOCYTES 0.2 0.0 - 1.0 K/UL    ABS. EOSINOPHILS 0.0 0.0 - 0.4 K/UL    ABS. BASOPHILS 0.0 0.0 - 0.1 K/UL    ABS. IMM. GRANS. 0.0 K/UL    DF MANUAL      PLATELET COMMENTS Large Platelets      RBC COMMENTS ANISOCYTOSIS  1+        RBC COMMENTS VACUOLATED POLYS  OVALOCYTES  1+       METABOLIC PANEL, COMPREHENSIVE    Collection Time: 07/21/19  1:27 AM   Result Value Ref Range    Sodium 138 136 - 145 mmol/L    Potassium 3.2 (L) 3.5 - 5.1 mmol/L    Chloride 105 97 - 108 mmol/L    CO2 25 21 - 32 mmol/L    Anion gap 8 5 - 15 mmol/L    Glucose 114 (H) 65 - 100 mg/dL    BUN 9 6 - 20 MG/DL    Creatinine 1.06 (H) 0.55 - 1.02 MG/DL    BUN/Creatinine ratio 8 (L) 12 - 20       GFR est AA >60 >60 ml/min/1.17m    GFR est non-AA 56 (L) >60 ml/min/1.781m   Calcium 9.1 8.5 - 10.1 MG/DL    Bilirubin, total 0.6 0.2 - 1.0 MG/DL    ALT (SGPT) 25 12 - 78 U/L    AST (SGOT) 20 15 - 37 U/L    Alk. phosphatase 78 45 - 117 U/L    Protein, total 8.0 6.4 - 8.2 g/dL    Albumin 3.6 3.5 - 5.0 g/dL    Globulin 4.4 (H) 2.0 - 4.0 g/dL    A-G Ratio 0.8 (L) 1.1 - 2.2     CK    Collection Time: 07/21/19  1:27 AM   Result Value Ref Range    CK 125 26 - 192 U/L   TROPONIN I    Collection Time: 07/21/19  1:27 AM   Result Value Ref Range    Troponin-I, Qt. <0.05 <0.05 ng/mL   ETHYL ALCOHOL    Collection Time: 07/21/19  1:50 AM   Result Value Ref Range    ALCOHOL(ETHYL),SERUM <10 <10 MG/DL   EKG, 12 LEAD, INITIAL    Collection Time: 07/21/19  2:18 AM   Result Value Ref Range    Ventricular Rate 67 BPM    Atrial Rate 67 BPM    P-R Interval 166 ms    QRS Duration 82 ms    Q-T Interval 424 ms    QTC Calculation (Bezet) 448 ms    Calculated P Axis 59 degrees    Calculated R Axis 5 degrees    Calculated T Axis 0 degrees    Diagnosis       Normal sinus rhythm  Nonspecific T wave abnormality  No previous ECGs available  Confirmed by CrLarey SeatM.D., [name] ([phone] 07/21/2019 11:17:15 AM     URINALYSIS  W/MICROSCOPIC    Collection Time: 07/21/19  3:44 AM   Result Value Ref Range    Color YELLOW/STRAW      Appearance CLEAR CLEAR      Specific gravity 1.008 1.003 - 1.030      pH (UA) 7.0 5.0 - 8.0      Protein Negative NEG mg/dL    Glucose Negative NEG mg/dL    Ketone Negative NEG mg/dL    Bilirubin Negative NEG      Blood TRACE (A) NEG      Urobilinogen 1.0 0.2 - 1.0 EU/dL    Nitrites Negative NEG      Leukocyte Esterase TRACE (A) NEG      WBC 0-4 0 - 4 /hpf    RBC 0-5 0 - 5 /hpf    Epithelial cells FEW FEW /lpf    Bacteria Negative NEG /hpf   URINE CULTURE HOLD SAMPLE    Collection Time: 07/21/19  3:44 AM    Specimen: Serum; Urine   Result Value Ref Range    Urine culture hold         Urine on hold in Microbiology dept for 2 days.  If unpreserved urine is submitted, it cannot be used for addtional testing after 24 hours, recollection will be required.   DRUG SCREEN, URINE    Collection Time: 07/21/19  3:44 AM   Result Value Ref Range    AMPHETAMINES Negative NEG      BARBITURATES Negative NEG      BENZODIAZEPINES Negative NEG      COCAINE Positive (A) NEG      METHADONE Positive (A) NEG      OPIATES Positive (A) NEG      PCP(PHENCYCLIDINE) Negative NEG      THC (TH-CANNABINOL) Positive (A) NEG      Drug screen comment (NOTE)    LACTIC ACID    Collection Time: 07/21/19  4:22 AM   Result Value Ref Range    Lactic acid 1.1 0.4 - 2.0 MMOL/L   PROTHROMBIN TIME + INR    Collection Time: 07/21/19  6:19 AM   Result Value Ref Range    INR 1.1 0.9 - 1.1      Prothrombin time 11.5 (H) 9.0 - 11.1 sec   PTT    Collection Time: 07/21/19  6:19 AM   Result Value Ref Range    aPTT 26.8 22.1 - 32.0 sec    aPTT, therapeutic range     58.0 - 77.0 SECS   FIBRINOGEN    Collection Time: 07/21/19  6:19 AM   Result Value Ref Range    Fibrinogen 254 200 - 475 mg/dL   LD    Collection Time: 07/21/19  6:19 AM   Result Value Ref Range    LD 206 81 - 246 U/L   FERRITIN    Collection Time: 07/21/19  6:19 AM   Result Value Ref Range    Ferritin 49 26 - 388 NG/ML   D DIMER    Collection Time: 07/21/19  6:19 AM   Result Value Ref Range    D-dimer 1.86 (H) 0.00 - 0.65 mg/L FEU   PROCALCITONIN    Collection Time: 07/21/19  6:19 AM   Result Value Ref Range    Procalcitonin 19.55 ng/mL   SARS-COV-2    Collection Time: 07/21/19  6:19 AM   Result Value Ref Range    Specimen source Nasopharyngeal      SARS-CoV-2 PENDING  SARS-CoV-2 PENDING     Specimen source Nasopharyngeal      COVID-19 rapid test PENDING     Specimen type NP Swab      Health status PENDING     COVID-19 PENDING    SAMPLES BEING HELD    Collection Time: 07/21/19  6:24 AM   Result Value Ref Range    SAMPLES BEING HELD 1LAV     COMMENT         Add-on orders for these samples will be processed based on acceptable specimen integrity and analyte stability, which may vary by analyte.     Recent Labs     07/21/19  0127   WBC 9.0   HGB 10.8*   HCT 34.2*   PLT 220     Recent Labs     07/21/19  0127   NA 138   K 3.2*   CL 105   CO2 25   BUN 9   CREA 1.06*   GLU 114*   CA 9.1     Recent Labs     07/21/19  0127   ALT 25   AP 78   TBILI 0.6   TP 8.0   ALB 3.6   GLOB 4.4*     Recent Labs     07/21/19  0619   INR 1.1   PTP 11.5*   APTT 26.8      Recent Labs     07/21/19  0619   FERR 24      Lab Results   Component Value Date/Time    Folate 5.4 03/14/2019 05:47 AM      No results for input(s): PH, PCO2, PO2 in the last 72 hours.  Recent Labs     07/21/19  0127   CPK 125   TROIQ <0.05     No results found for: CHOL, CHOLX, CHLST, CHOLV, HDL, HDLP, LDL, LDLC, DLDLP, TGLX, TRIGL, TRIGP, CHHD, CHHDX  Lab Results   Component Value Date/Time    Glucose (POC) 144 (H) 03/14/2019 09:56 AM     Lab Results   Component Value Date/Time    Color YELLOW/STRAW 07/21/2019 03:44 AM    Appearance CLEAR 07/21/2019 03:44 AM    Specific gravity 1.008 07/21/2019 03:44 AM    Specific gravity 1.029 09/14/2009 08:45 PM    pH (UA) 7.0 07/21/2019 03:44 AM    Protein Negative 07/21/2019 03:44 AM    Glucose Negative 07/21/2019 03:44 AM    Ketone Negative 07/21/2019 03:44 AM    Bilirubin Negative 07/21/2019 03:44 AM    Urobilinogen 1.0 07/21/2019 03:44 AM    Nitrites Negative 07/21/2019 03:44 AM    Leukocyte Esterase TRACE (A) 07/21/2019 03:44 AM    Epithelial cells FEW 07/21/2019 03:44 AM    Bacteria Negative 07/21/2019 03:44 AM    WBC 0-4 07/21/2019 03:44 AM    RBC 0-5 07/21/2019 03:44 AM       Med list reviewed  Current Facility-Administered Medications   Medication Dose Route Frequency   ??? sodium chloride (NS) flush 5-40 mL  5-40 mL IntraVENous Q8H   ??? sodium chloride (NS) flush 5-40 mL  5-40 mL IntraVENous PRN   ??? acetaminophen (TYLENOL) tablet 650 mg  650 mg Oral Q6H PRN    Or    ??? acetaminophen (TYLENOL) suppository 650 mg  650 mg Rectal Q6H PRN   ??? polyethylene glycol (MIRALAX) packet 17 g  17 g Oral DAILY PRN   ??? promethazine (PHENERGAN) tablet 12.5 mg  12.5 mg Oral Q6H PRN    Or   ??? ondansetron (ZOFRAN) injection 4 mg  4 mg IntraVENous Q6H PRN   ??? enoxaparin (LOVENOX) injection 40 mg  40 mg SubCUTAneous DAILY   ??? piperacillin-tazobactam (ZOSYN) 3.375 g in 0.9% sodium chloride (MBP/ADV) 100 mL  3.375 g IntraVENous Q8H   ??? vancomycin pharmacy dosing   Other Rx Dosing/Monitoring   ??? vancomycin (VANCOCIN) 1250 mg in NS 250 ml infusion  1,250 mg IntraVENous Q16H   ??? .PHARMACY TO SUBSTITUTE PER PROTOCOL (Reordered from: buprenorphine-naloxone (SUBOXONE) 8-2 mg film sublingaul film)    Per Protocol   ??? gabapentin (NEURONTIN) capsule 800 mg  800 mg Oral TID   ??? meloxicam (MOBIC) tablet 7.5 mg  7.5 mg Oral DAILY   ??? mirtazapine (REMERON SOL-TAB) disintegrating tablet 15 mg  15 mg Oral QHS     Current Outpatient Medications   Medication Sig   ??? meloxicam (MOBIC) 7.5 mg tablet Indications: received today   ??? rizatriptan (MAXALT-MLT) 10 mg disintegrating tablet    ??? topiramate (TOPAMAX) 50 mg tablet    ??? buprenorphine-naloxone (SUBOXONE) 8-2 mg film sublingaul film 1 Film by SubLINGual route three (3) times daily.   ??? DULoxetine (CYMBALTA) 60 mg capsule Take 60 mg by mouth daily.   ??? famotidine (PEPCID) 20 mg tablet Take 20 mg by mouth two (2) times a day.   ??? gabapentin (NEURONTIN) 800 mg tablet Take 800 mg by mouth three (3) times daily.   ??? mirtazapine (REMERON SOL-TAB) 15 mg disintegrating tablet Take 15 mg by mouth nightly.   ??? naproxen (NAPROSYN) 375 mg tablet Take 375 mg by mouth two (2) times daily (with meals).   ??? risperiDONE (RisperDAL) 1 mg tablet Take 1 mg by mouth daily.       Care Plan discussed with:  Patient/Family and Nurse    Cheryll Cockayne, MD  Internal Medicine  Date of Service: 07/21/2019

## 2019-07-21 NOTE — Progress Notes (Addendum)
2041: TRANSFER - IN REPORT:    Verbal report received from carrie RN(name) on Anita Bell  being received from ED(unit) for routine progression of care      Report consisted of patient???s Situation, Background, Assessment and   Recommendations(SBAR).     Information from the following report(s) SBAR, ED Summary, Intake/Output, MAR, and Cardiac Rhythm NSR  was reviewed with the receiving nurse.    Opportunity for questions and clarification was provided.      Assessment completed upon patient???s arrival to unit and care assumed.     2113: Primary Nurse Geanie Logan and Sharyne Richters, RN performed a dual skin assessment on this patient No impairment noted  Braden score is 20.    2242: This RN called and left message with [facility] to call back and verify methadone dosage.        Problem: Risk for Spread of Infection  Goal: Prevent transmission of infectious organism to others  Description: Prevent the transmission of infectious organisms to other patients, staff members, and visitors.  Outcome: Progressing Towards Goal     Problem: Falls - Risk of  Goal: *Absence of Falls  Description: Document [name] Fall Risk and appropriate interventions in the flowsheet.  Outcome: Progressing Towards Goal  Note: Fall Risk Interventions:    Elimination Interventions: Call light in reach

## 2019-07-21 NOTE — ED Provider Notes (Signed)
This is a 48 year old female with no reported past medical history (see documentated PMH below) who presents the ER today for chief complaint of fever.  Patient states that she first noticed her fever several hours ago after waking up from a nap this evening.   Patient noted to be very drowsy and minimally responsive, and therefore cannot provide much information related to the HPI and ROS.  She reports using cocaine and IV heroin around 7 PM this evening.    She denies any other illicit drug use or alcohol use.  Patient also denies suicidal ideations.           Past Medical History:   Diagnosis Date   ??? ADHD    ??? Asthma    ??? Depression    ??? Fibromyalgia    ??? Gastroenteritis    ??? Hydradenitis     suppurativa   ??? Osteoarthritis    ??? Other ill-defined conditions(799.89)     chronic back pain   ??? Psychiatric disorder        Past Surgical History:   Procedure Laterality Date   ??? HX GYN      tubes tided         Family History:   Problem Relation Age of Onset   ??? Alcohol abuse Neg Hx    ??? Arthritis-rheumatoid Neg Hx    ??? Asthma Neg Hx    ??? Bleeding Prob Neg Hx    ??? Cancer Neg Hx    ??? Diabetes Neg Hx    ??? Elevated Lipids Neg Hx    ??? Headache Neg Hx    ??? Heart Disease Neg Hx    ??? Hypertension Neg Hx    ??? Lung Disease Neg Hx    ??? Migraines Neg Hx    ??? Psychiatric Disorder Neg Hx    ??? Stroke Neg Hx    ??? Mental Retardation Neg Hx        Social History     Socioeconomic History   ??? Marital status: MARRIED     Spouse name: Not on file   ??? Number of children: Not on file   ??? Years of education: Not on file   ??? Highest education level: Not on file   Occupational History   ??? Not on file   Social Needs   ??? Financial resource strain: Not on file   ??? Food insecurity     Worry: Not on file     Inability: Not on file   ??? Transportation needs     Medical: Not on file     Non-medical: Not on file   Tobacco Use   ??? Smoking status: Never Smoker   ??? Smokeless tobacco: Never Used   Substance and Sexual Activity   ??? Alcohol use: No    ??? Drug use: Yes     Types: Prescription, Opiates, Cocaine, Heroin   ??? Sexual activity: Not on file   Lifestyle   ??? Physical activity     Days per week: Not on file     Minutes per session: Not on file   ??? Stress: Not on file   Relationships   ??? Social Product manager on phone: Not on file     Gets together: Not on file     Attends religious service: Not on file     Active member of club or organization: Not on file     Attends meetings of clubs or organizations:  Not on file     Relationship status: Not on file   ??? Intimate partner violence     Fear of current or ex partner: Not on file     Emotionally abused: Not on file     Physically abused: Not on file     Forced sexual activity: Not on file   Other Topics Concern   ??? Not on file   Social History Narrative    ** Merged History Encounter **              ALLERGIES: Aspirin; Aspirin; Ibuprofen; and Ibuprofen    Review of Systems   Constitutional: Positive for chills and fever.   HENT: Negative for sore throat.    Eyes: Negative for visual disturbance.   Respiratory: Negative for cough and shortness of breath.    Cardiovascular: Negative for palpitations.   Gastrointestinal: Negative for vomiting.   Genitourinary: Negative for dysuria.   Musculoskeletal: Negative for myalgias.   Skin: Negative for rash.   Neurological: Negative for dizziness.   Psychiatric/Behavioral: Negative for dysphoric mood.       Vitals:    07/21/19 0014 07/21/19 0200   BP: (!) 154/92 119/69   Pulse: 98 67   Resp: 16 12   Temp: (!) 100.9 ??F (38.3 ??C)    SpO2: 100% 98%   Weight: 84 kg (185 lb 3 oz)    Height: 5' 3"  (1.6 m)             Physical Exam  Vitals signs and nursing note reviewed.   Constitutional:       General: She is not in acute distress.     Appearance: Normal appearance. She is not ill-appearing.   HENT:      Head: Normocephalic and atraumatic.      Comments: Pupils 1 - 2 mm and reactive bilaterally     Right Ear: External ear normal.      Left Ear: External ear normal.       Nose: Nose normal.      Mouth/Throat:      Mouth: Mucous membranes are dry.      Pharynx: Oropharynx is clear.   Eyes:      General: No scleral icterus.     Extraocular Movements: Extraocular movements intact.      Conjunctiva/sclera: Conjunctivae normal.      Pupils: Pupils are equal, round, and reactive to light.   Neck:      Musculoskeletal: Normal range of motion and neck supple. No neck rigidity or muscular tenderness.   Cardiovascular:      Rate and Rhythm: Normal rate and regular rhythm.      Pulses: Normal pulses.           Radial pulses are 2+ on the right side and 2+ on the left side.      Heart sounds: Murmur present.   Pulmonary:      Effort: Bradypnea present.      Breath sounds: Normal breath sounds.   Abdominal:      General: Abdomen is flat. Bowel sounds are normal.      Palpations: Abdomen is soft.   Musculoskeletal: Normal range of motion.   Skin:     General: Skin is warm and dry.      Coloration: Skin is not pale.   Neurological:      General: No focal deficit present.      Mental Status: She is lethargic and disoriented.  GCS: GCS eye subscore is 3. GCS verbal subscore is 4. GCS motor subscore is 6.   Psychiatric:         Mood and Affect: Mood normal.         Behavior: Behavior normal.         Thought Content: Thought content normal.         Judgment: Judgment normal.          MDM      VITAL SIGNS:  Patient Vitals for the past 4 hrs:   Temp Pulse Resp BP SpO2   07/21/19 0200 ??? 67 12 119/69 98 %   07/21/19 0014 (!) 100.9 ??F (38.3 ??C) 98 16 (!) 154/92 100 %         LABS:  Recent Results (from the past 6 hour(s))   CBC WITH AUTOMATED DIFF    Collection Time: 07/21/19  1:27 AM   Result Value Ref Range    WBC 9.0 3.6 - 11.0 K/uL    RBC 3.95 3.80 - 5.20 M/uL    HGB 10.8 (L) 11.5 - 16.0 g/dL    HCT 34.2 (L) 35.0 - 47.0 %    MCV 86.6 80.0 - 99.0 FL    MCH 27.3 26.0 - 34.0 PG    MCHC 31.6 30.0 - 36.5 g/dL    RDW 14.9 (H) 11.5 - 14.5 %    PLATELET 220 150 - 400 K/uL    MPV 11.3 8.9 - 12.9 FL     NRBC 0.0 0 PER 100 WBC    ABSOLUTE NRBC 0.00 0.00 - 0.01 K/uL    NEUTROPHILS 85 (H) 32 - 75 %    BAND NEUTROPHILS 6 0 - 6 %    LYMPHOCYTES 7 (L) 12 - 49 %    MONOCYTES 2 (L) 5 - 13 %    EOSINOPHILS 0 0 - 7 %    BASOPHILS 0 0 - 1 %    IMMATURE GRANULOCYTES 0 %    ABS. NEUTROPHILS 8.2 (H) 1.8 - 8.0 K/UL    ABS. LYMPHOCYTES 0.6 (L) 0.8 - 3.5 K/UL    ABS. MONOCYTES 0.2 0.0 - 1.0 K/UL    ABS. EOSINOPHILS 0.0 0.0 - 0.4 K/UL    ABS. BASOPHILS 0.0 0.0 - 0.1 K/UL    ABS. IMM. GRANS. 0.0 K/UL    DF MANUAL      PLATELET COMMENTS Large Platelets      RBC COMMENTS ANISOCYTOSIS  1+        RBC COMMENTS VACUOLATED POLYS  OVALOCYTES  1+       METABOLIC PANEL, COMPREHENSIVE    Collection Time: 07/21/19  1:27 AM   Result Value Ref Range    Sodium 138 136 - 145 mmol/L    Potassium 3.2 (L) 3.5 - 5.1 mmol/L    Chloride 105 97 - 108 mmol/L    CO2 25 21 - 32 mmol/L    Anion gap 8 5 - 15 mmol/L    Glucose 114 (H) 65 - 100 mg/dL    BUN 9 6 - 20 MG/DL    Creatinine 1.06 (H) 0.55 - 1.02 MG/DL    BUN/Creatinine ratio 8 (L) 12 - 20      GFR est AA >60 >60 ml/min/1.76m    GFR est non-AA 56 (L) >60 ml/min/1.747m   Calcium 9.1 8.5 - 10.1 MG/DL    Bilirubin, total 0.6 0.2 - 1.0 MG/DL    ALT (SGPT) 25 12 - 78 U/L  AST (SGOT) 20 15 - 37 U/L    Alk. phosphatase 78 45 - 117 U/L    Protein, total 8.0 6.4 - 8.2 g/dL    Albumin 3.6 3.5 - 5.0 g/dL    Globulin 4.4 (H) 2.0 - 4.0 g/dL    A-G Ratio 0.8 (L) 1.1 - 2.2     CK    Collection Time: 07/21/19  1:27 AM   Result Value Ref Range    CK 125 26 - 192 U/L   EKG, 12 LEAD, INITIAL    Collection Time: 07/21/19  2:18 AM   Result Value Ref Range    Ventricular Rate 67 BPM    Atrial Rate 67 BPM    P-R Interval 166 ms    QRS Duration 82 ms    Q-T Interval 424 ms    QTC Calculation (Bezet) 448 ms    Calculated P Axis 59 degrees    Calculated R Axis 5 degrees    Calculated T Axis 0 degrees    Diagnosis       Normal sinus rhythm  Nonspecific T wave abnormality  No previous ECGs available          IMAGING:   XR CHEST PORT   Final Result   IMPRESSION:   No acute process.            Medications During Visit:  Medications   sodium chloride 0.9 % bolus infusion 1,000 mL (1,000 mL IntraVENous New Bag 07/21/19 0156)   naloxone (NARCAN) injection 0.4 mg (has no administration in time range)         DECISION MAKING:  Samiksha Stetler is a 48 y.o. female who comes in as above.   2:49 AM  Patient remains very drowsy and provides very little information related to her clinical presentation.  She last used cocaine and urine this evening around 7 PM. She is alert and oriented x3, but is very lethargic and slow to respond.        IMPRESSION:  1. Substance abuse ([place])        DISPOSITION:        Current Discharge Medication List           Follow-up Information     Follow up With Specialties Details Why Contact Info    See list of free clinics for follow up care                The patient is asked to follow-up with their primary care provider in the next several days.  They are to call tomorrow for an appointment.  The patient is asked to return promptly for any increased concerns or worsening of symptoms.  They can return to this emergency department or any other emergency department.

## 2019-07-21 NOTE — H&P (Signed)
[place]  History and Physical    Primary Care Provider: None  Date of Service:  07/21/2019    Subjective:     Anita Bell is a 48 y.o. female past medical history significant for polysubstance abuse, psychiatric disorders presented emergency room complaining on a fever.  She stated that today she started having fevers and chills at home.  Unknown how high it was temperature at home.  She reports using cocaine and heroin around 7 PM and afterward she started feeling feverish.  She denies any symptoms such as headache, cough, shortness of breath, chest pain, sore throat, runny nose, nausea, vomiting, diarrhea, urinary symptom.  On arrival to the emergency room patient was found to be febrile with a temperature of 100.9 lethargic but arousable.  Work-up overall unremarkable.  Given her risk factor admission was requested.  Patient awake and alert at this time.      Review of Systems:    Review of Systems   Constitutional: Positive for chills and fever. Negative for malaise/fatigue and weight loss.   HENT: Negative for congestion, ear discharge and hearing loss.    Eyes: Negative for blurred vision and double vision.   Respiratory: Negative for cough, sputum production, shortness of breath and wheezing.    Cardiovascular: Negative for chest pain, palpitations, orthopnea, leg swelling and PND.   Gastrointestinal: Negative for abdominal pain, constipation, diarrhea, melena, nausea and vomiting.   Genitourinary: Negative for dysuria, frequency and urgency.   Musculoskeletal: Negative for back pain, joint pain and myalgias.   Skin: Negative for itching and rash.   Neurological: Negative for dizziness, sensory change, speech change, focal weakness, weakness and headaches.   Endo/Heme/Allergies: Negative for polydipsia. Does not bruise/bleed easily.       Past Medical History:   Diagnosis Date   ??? ADHD    ??? Asthma    ??? Depression    ??? Fibromyalgia    ??? Gastroenteritis     ??? Hydradenitis     suppurativa   ??? Osteoarthritis    ??? Other ill-defined conditions(799.89)     chronic back pain   ??? Psychiatric disorder       Past Surgical History:   Procedure Laterality Date   ??? HX GYN      tubes tided     Prior to Admission medications    Medication Sig Start Date End Date Taking? Authorizing Provider   meloxicam (MOBIC) 7.5 mg tablet Indications: received today 03/24/19   Provider, Historical   rizatriptan (MAXALT-MLT) 10 mg disintegrating tablet  03/24/19   Provider, Historical   topiramate (TOPAMAX) 50 mg tablet  03/24/19   Provider, Historical   buprenorphine-naloxone (SUBOXONE) 8-2 mg film sublingaul film 1 Film by SubLINGual route three (3) times daily.    Provider, Historical   DULoxetine (CYMBALTA) 60 mg capsule Take 60 mg by mouth daily.    Provider, Historical   famotidine (PEPCID) 20 mg tablet Take 20 mg by mouth two (2) times a day.    Provider, Historical   gabapentin (NEURONTIN) 800 mg tablet Take 800 mg by mouth three (3) times daily.    Provider, Historical   mirtazapine (REMERON SOL-TAB) 15 mg disintegrating tablet Take 15 mg by mouth nightly.    Provider, Historical   naproxen (NAPROSYN) 375 mg tablet Take 375 mg by mouth two (2) times daily (with meals).    Provider, Historical   risperiDONE (RisperDAL) 1 mg tablet Take 1 mg by mouth daily.  Provider, Historical     Allergies   Allergen Reactions   ??? Aspirin Nausea and Vomiting   ??? Aspirin Hives   ??? Ibuprofen Other (comments)     Upsets her stomach   ??? Ibuprofen Other (comments)     Abdominal pain        Family History   Problem Relation Age of Onset   ??? Alcohol abuse Neg Hx    ??? Arthritis-rheumatoid Neg Hx    ??? Asthma Neg Hx    ??? Bleeding Prob Neg Hx    ??? Cancer Neg Hx    ??? Diabetes Neg Hx    ??? Elevated Lipids Neg Hx    ??? Headache Neg Hx    ??? Heart Disease Neg Hx    ??? Hypertension Neg Hx    ??? Lung Disease Neg Hx    ??? Migraines Neg Hx    ??? Psychiatric Disorder Neg Hx    ??? Stroke Neg Hx    ??? Mental Retardation Neg Hx          SOCIAL HISTORY:  Patient resides at home  Patient ambulates independently   Smoking history: none  Alcohol history: occasional  Illicit drug use: cocaine, marijuana, heroine        Objective:       Physical Exam:   Patient Vitals for the past 12 hrs:   Temp Pulse Resp BP SpO2   07/21/19 0415 ??? 66 25 (!) 105/57 96 %   07/21/19 0400 ??? 67 22 109/62 96 %   07/21/19 0345 ??? 83 15 136/86 97 %   07/21/19 0330 ??? 74 11 125/79 96 %   07/21/19 0315 ??? 68 12 116/66 96 %   07/21/19 0300 ??? 70 15 127/73 98 %   07/21/19 0245 99.1 ??F (37.3 ??C) 64 12 118/75 100 %   07/21/19 0230 ??? 65 11 (!) 111/59 95 %   07/21/19 0215 ??? 71 11 123/72 97 %   07/21/19 0200 ??? 67 12 119/69 98 %   07/21/19 0014 (!) 100.9 ??F (38.3 ??C) 98 16 (!) 154/92 100 %     GEN APPEARANCE: Patient resting in bed in NAD  HEENT: Conjunctiva Clear  CVS: RRR, 2/6 systolic murmur best heard left sternal border  LUNGS: CTAB; No Wheezes; No Rhonchi: No rales  ABD: Soft; No TTP; + Normoactive BS  EXT: WWP, no edema   Skin exam: No gross lesions noted on exposed skin surfaces  MENTAL STATUS: Answers questions appropriately, responds to commands.   NEURO: Cranial nerve exam: EOMI, CN V sensory/motor intact, no facial asymmetry noted, patient able to hearl, uvula midline, able to move tongue left/right. No gross motor or sensory deficits      Data Review:   Recent Results (from the past 24 hour(s))   CBC WITH AUTOMATED DIFF    Collection Time: 07/21/19  1:27 AM   Result Value Ref Range    WBC 9.0 3.6 - 11.0 K/uL    RBC 3.95 3.80 - 5.20 M/uL    HGB 10.8 (L) 11.5 - 16.0 g/dL    HCT 34.2 (L) 35.0 - 47.0 %    MCV 86.6 80.0 - 99.0 FL    MCH 27.3 26.0 - 34.0 PG    MCHC 31.6 30.0 - 36.5 g/dL    RDW 14.9 (H) 11.5 - 14.5 %    PLATELET 220 150 - 400 K/uL    MPV 11.3 8.9 - 12.9 FL    NRBC 0.0  0 PER 100 WBC    ABSOLUTE NRBC 0.00 0.00 - 0.01 K/uL    NEUTROPHILS 85 (H) 32 - 75 %    BAND NEUTROPHILS 6 0 - 6 %    LYMPHOCYTES 7 (L) 12 - 49 %    MONOCYTES 2 (L) 5 - 13 %    EOSINOPHILS 0 0 - 7 %     BASOPHILS 0 0 - 1 %    IMMATURE GRANULOCYTES 0 %    ABS. NEUTROPHILS 8.2 (H) 1.8 - 8.0 K/UL    ABS. LYMPHOCYTES 0.6 (L) 0.8 - 3.5 K/UL    ABS. MONOCYTES 0.2 0.0 - 1.0 K/UL    ABS. EOSINOPHILS 0.0 0.0 - 0.4 K/UL    ABS. BASOPHILS 0.0 0.0 - 0.1 K/UL    ABS. IMM. GRANS. 0.0 K/UL    DF MANUAL      PLATELET COMMENTS Large Platelets      RBC COMMENTS ANISOCYTOSIS  1+        RBC COMMENTS VACUOLATED POLYS  OVALOCYTES  1+       METABOLIC PANEL, COMPREHENSIVE    Collection Time: 07/21/19  1:27 AM   Result Value Ref Range    Sodium 138 136 - 145 mmol/L    Potassium 3.2 (L) 3.5 - 5.1 mmol/L    Chloride 105 97 - 108 mmol/L    CO2 25 21 - 32 mmol/L    Anion gap 8 5 - 15 mmol/L    Glucose 114 (H) 65 - 100 mg/dL    BUN 9 6 - 20 MG/DL    Creatinine 1.06 (H) 0.55 - 1.02 MG/DL    BUN/Creatinine ratio 8 (L) 12 - 20      GFR est AA >60 >60 ml/min/1.55m    GFR est non-AA 56 (L) >60 ml/min/1.769m   Calcium 9.1 8.5 - 10.1 MG/DL    Bilirubin, total 0.6 0.2 - 1.0 MG/DL    ALT (SGPT) 25 12 - 78 U/L    AST (SGOT) 20 15 - 37 U/L    Alk. phosphatase 78 45 - 117 U/L    Protein, total 8.0 6.4 - 8.2 g/dL    Albumin 3.6 3.5 - 5.0 g/dL    Globulin 4.4 (H) 2.0 - 4.0 g/dL    A-G Ratio 0.8 (L) 1.1 - 2.2     CK    Collection Time: 07/21/19  1:27 AM   Result Value Ref Range    CK 125 26 - 192 U/L   TROPONIN I    Collection Time: 07/21/19  1:27 AM   Result Value Ref Range    Troponin-I, Qt. <0.05 <0.05 ng/mL   ETHYL ALCOHOL    Collection Time: 07/21/19  1:50 AM   Result Value Ref Range    ALCOHOL(ETHYL),SERUM <10 <10 MG/DL   EKG, 12 LEAD, INITIAL    Collection Time: 07/21/19  2:18 AM   Result Value Ref Range    Ventricular Rate 67 BPM    Atrial Rate 67 BPM    P-R Interval 166 ms    QRS Duration 82 ms    Q-T Interval 424 ms    QTC Calculation (Bezet) 448 ms    Calculated P Axis 59 degrees    Calculated R Axis 5 degrees    Calculated T Axis 0 degrees    Diagnosis       Normal sinus rhythm  Nonspecific T wave abnormality  No previous ECGs available      URINALYSIS W/MICROSCOPIC  Collection Time: 07/21/19  3:44 AM   Result Value Ref Range    Color YELLOW/STRAW      Appearance CLEAR CLEAR      Specific gravity 1.008 1.003 - 1.030      pH (UA) 7.0 5.0 - 8.0      Protein Negative NEG mg/dL    Glucose Negative NEG mg/dL    Ketone Negative NEG mg/dL    Bilirubin Negative NEG      Blood TRACE (A) NEG      Urobilinogen 1.0 0.2 - 1.0 EU/dL    Nitrites Negative NEG      Leukocyte Esterase TRACE (A) NEG      WBC 0-4 0 - 4 /hpf    RBC 0-5 0 - 5 /hpf    Epithelial cells FEW FEW /lpf    Bacteria Negative NEG /hpf   URINE CULTURE HOLD SAMPLE    Collection Time: 07/21/19  3:44 AM    Specimen: Serum; Urine   Result Value Ref Range    Urine culture hold        Urine on hold in Microbiology dept for 2 days.  If unpreserved urine is submitted, it cannot be used for addtional testing after 24 hours, recollection will be required.   DRUG SCREEN, URINE    Collection Time: 07/21/19  3:44 AM   Result Value Ref Range    AMPHETAMINES Negative NEG      BARBITURATES Negative NEG      BENZODIAZEPINES Negative NEG      COCAINE Positive (A) NEG      METHADONE Positive (A) NEG      OPIATES Positive (A) NEG      PCP(PHENCYCLIDINE) Negative NEG      THC (TH-CANNABINOL) Positive (A) NEG      Drug screen comment (NOTE)    LACTIC ACID    Collection Time: 07/21/19  4:22 AM   Result Value Ref Range    Lactic acid 1.1 0.4 - 2.0 MMOL/L   SARS-COV-2    Collection Time: 07/21/19  6:19 AM   Result Value Ref Range    Specimen source Nasopharyngeal      SARS-CoV-2 PENDING     SARS-CoV-2 PENDING     Specimen source Nasopharyngeal      COVID-19 rapid test PENDING     Specimen type NP Swab      Health status PENDING     COVID-19 PENDING    SAMPLES BEING HELD    Collection Time: 07/21/19  6:24 AM   Result Value Ref Range    SAMPLES BEING HELD 1LAV     COMMENT        Add-on orders for these samples will be processed based on acceptable specimen integrity and analyte stability, which may vary by analyte.      Xr Chest Port    Result Date: 07/21/2019  IMPRESSION: No acute process.      Assessment:     Active Problems:    Fever (07/21/2019)      48 year old female with past medical history significant for polysubstance abuse presented emergency room for fever of unknown origin.    Plan:     1. Fever (POA): known etiology. concerned for bacteremia/endocarditis given h/o IV drug use and new murmur vs viral infection.   ???COVID 19 test sent  ???Check respiratory panel  ???Follow-up blood culture.  ??? empiric treatment with Vanco IVand zosyn IV  ??? Sepsis assessment completed.  Patient does not meet sepsis criteria at  this time.  ???Placed??on droplet precaution plus contact      2. Systolic Murmur: new finding as per pt report. ? Endocarditis  - Echocardiogram ordered    3. Polysubstance abuse:  - Monitor for signs of withdrawal. Supportive care  - On methadone. Dose needs to be verified.     DVT prophy: lovenox   Code status: full code      FUNCTIONAL STATUS PRIOR TO HOSPITALIZATION Ambulates Independently    Signed By: Revonda Standard, MD     July 21, 2019

## 2019-07-21 NOTE — Progress Notes (Signed)
Pharmacist Note - Vancomycin Dosing    Consult provided for this 48 y.o. female for indication of "Fever (POA): known etiology. concerned for bacteremia/endocarditis given h/o IV drug use and new murmur vs viral infection"  Antibiotic regimen(s): vancomycin, Zosyn  Patient on vancomycin PTA? NO     Recent Labs     07/21/19  0127   WBC 9.0   CREA 1.06*   BUN 9     Frequency of BMP: Daily x 3 days  Height: 160 cm  Weight: 84 kg  Est CrCl: 68 ml/min; UO: unmeasured  Temp (24hrs), Avg:100 ??F (37.8 ??C), Min:99.1 ??F (37.3 ??C), Max:100.9 ??F (38.3 ??C)    Cultures:  09/29 Blood, paired - pending  09/29 U/A - nitrite neg, LE trace, bacteria neg  09/29 Urine - on hold    Goal trough = 15 - 20 mcg/mL    Therapy will be initiated with a loading dose of 1750 mg IV x 1 (09/29 @ [id number]) to be followed by a maintenance dose of 1250 mg IV every 16 hours.  Pharmacy to follow patient daily and order levels / make dose adjustments as appropriate.

## 2019-07-21 NOTE — ED Triage Notes (Signed)
Pt. Arrived from home via EMS with c/o of body aches and fever after using Cocaine ad Heroin at 1900 9/28. Pt. Very drowsy in triage but states this is because she worked all day and is tired. Pt. States her husband said she vomited but she doesn't remember. Pt. States she is on Methadone.

## 2019-07-22 LAB — CBC WITH AUTO DIFFERENTIAL
Basophils %: 0 % (ref 0–1)
Basophils Absolute: 0 10*3/uL (ref 0.0–0.1)
Eosinophils %: 4 % (ref 0–7)
Eosinophils Absolute: 0.2 10*3/uL (ref 0.0–0.4)
Granulocyte Absolute Count: 0 10*3/uL (ref 0.00–0.04)
Hematocrit: 32.2 % — ABNORMAL LOW (ref 35.0–47.0)
Hemoglobin: 10.1 g/dL — ABNORMAL LOW (ref 11.5–16.0)
Immature Granulocytes: 0 % (ref 0.0–0.5)
Lymphocytes %: 37 % (ref 12–49)
Lymphocytes Absolute: 2.1 10*3/uL (ref 0.8–3.5)
MCH: 27.1 PG (ref 26.0–34.0)
MCHC: 31.4 g/dL (ref 30.0–36.5)
MCV: 86.3 FL (ref 80.0–99.0)
MPV: 11.9 FL (ref 8.9–12.9)
Monocytes %: 7 % (ref 5–13)
Monocytes Absolute: 0.4 10*3/uL (ref 0.0–1.0)
NRBC Absolute: 0 10*3/uL (ref 0.00–0.01)
Neutrophils %: 52 % (ref 32–75)
Neutrophils Absolute: 3 10*3/uL (ref 1.8–8.0)
Nucleated RBCs: 0 PER 100 WBC
Platelets: 204 10*3/uL (ref 150–400)
RBC: 3.73 M/uL — ABNORMAL LOW (ref 3.80–5.20)
RDW: 15.4 % — ABNORMAL HIGH (ref 11.5–14.5)
WBC: 5.7 10*3/uL (ref 3.6–11.0)

## 2019-07-22 LAB — BASIC METABOLIC PANEL
Anion Gap: 6 mmol/L (ref 5–15)
BUN: 8 MG/DL (ref 6–20)
Bun/Cre Ratio: 8 — ABNORMAL LOW (ref 12–20)
CO2: 25 mmol/L (ref 21–32)
Calcium: 8.5 MG/DL (ref 8.5–10.1)
Chloride: 111 mmol/L — ABNORMAL HIGH (ref 97–108)
Creatinine: 1.01 MG/DL (ref 0.55–1.02)
EGFR IF NonAfrican American: 59 mL/min/{1.73_m2} — ABNORMAL LOW (ref >60)
GFR African American: >60 mL/min/{1.73_m2} (ref >60)
Glucose: 80 mg/dL (ref 65–100)
Potassium: 3.2 mmol/L — ABNORMAL LOW (ref 3.5–5.1)
Sodium: 142 mmol/L (ref 136–145)

## 2019-07-22 LAB — COVID-19: SARS-CoV-2: NOT DETECTED

## 2019-07-22 LAB — PROTIME-INR
INR: 1.1 (ref 0.9–1.1)
Protime: 11.3 s — ABNORMAL HIGH (ref 9.0–11.1)

## 2019-07-22 LAB — APTT: aPTT: 28.2 s (ref 22.1–32.0)

## 2019-07-22 LAB — FIBRINOGEN
Fibrinogen: 276 mg/dL (ref 200–475)
Fibrinogen: 276 mg/dL (ref 200–475)

## 2019-07-22 LAB — D-DIMER, QUANTITATIVE: D-Dimer, Quant: 1.54 mg/L FEU — ABNORMAL HIGH (ref 0.00–0.65)

## 2019-07-22 LAB — CBC WITH AUTOMATED DIFF
ABS. BASOPHILS: 0 10*3/uL (ref 0.0–0.1)
ABS. EOSINOPHILS: 0.2 10*3/uL (ref 0.0–0.4)
ABS. IMM. GRANS.: 0 10*3/uL (ref 0.00–0.04)
ABS. LYMPHOCYTES: 2.1 10*3/uL (ref 0.8–3.5)
ABS. MONOCYTES: 0.4 10*3/uL (ref 0.0–1.0)
ABS. NEUTROPHILS: 3 10*3/uL (ref 1.8–8.0)
ABSOLUTE NRBC: 0 10*3/uL (ref 0.00–0.01)
BASOPHILS: 0 % (ref 0–1)
EOSINOPHILS: 4 % (ref 0–7)
HCT: 32.2 % — ABNORMAL LOW (ref 35.0–47.0)
HGB: 10.1 g/dL — ABNORMAL LOW (ref 11.5–16.0)
IMMATURE GRANULOCYTES: 0 % (ref 0.0–0.5)
LYMPHOCYTES: 37 % (ref 12–49)
MCH: 27.1 PG (ref 26.0–34.0)
MCHC: 31.4 g/dL (ref 30.0–36.5)
MCV: 86.3 FL (ref 80.0–99.0)
MONOCYTES: 7 % (ref 5–13)
MPV: 11.9 FL (ref 8.9–12.9)
NEUTROPHILS: 52 % (ref 32–75)
NRBC: 0 PER 100 WBC
PLATELET: 204 10*3/uL (ref 150–400)
RBC: 3.73 M/uL — ABNORMAL LOW (ref 3.80–5.20)
RDW: 15.4 % — ABNORMAL HIGH (ref 11.5–14.5)
WBC: 5.7 10*3/uL (ref 3.6–11.0)

## 2019-07-22 LAB — SARS-COV-2: SARS-CoV-2: NOT DETECTED

## 2019-07-22 LAB — METABOLIC PANEL, BASIC
Anion gap: 6 mmol/L (ref 5–15)
BUN/Creatinine ratio: 8 — ABNORMAL LOW (ref 12–20)
BUN: 8 MG/DL (ref 6–20)
CO2: 25 mmol/L (ref 21–32)
Calcium: 8.5 MG/DL (ref 8.5–10.1)
Chloride: 111 mmol/L — ABNORMAL HIGH (ref 97–108)
Creatinine: 1.01 MG/DL (ref 0.55–1.02)
GFR est AA: >60 mL/min/{1.73_m2} (ref >60)
GFR est non-AA: 59 mL/min/{1.73_m2} — ABNORMAL LOW (ref >60)
Glucose: 80 mg/dL (ref 65–100)
Potassium: 3.2 mmol/L — ABNORMAL LOW (ref 3.5–5.1)
Sodium: 142 mmol/L (ref 136–145)

## 2019-07-22 LAB — PTT: aPTT: 28.2 s (ref 22.1–32.0)

## 2019-07-22 LAB — PROTHROMBIN TIME + INR
INR: 1.1 (ref 0.9–1.1)
Prothrombin time: 11.3 s — ABNORMAL HIGH (ref 9.0–11.1)

## 2019-07-22 LAB — D DIMER: D-dimer: 1.54 mg/L FEU — ABNORMAL HIGH (ref 0.00–0.65)

## 2019-07-22 MED ORDER — METHADONE 1 MG/ML ORAL SOLN
5 mg/ mL | Freq: Every day | ORAL | Status: DC
Start: 2019-07-22 — End: 2019-07-25
  Administered 2019-07-22 – 2019-07-25 (×4): via ORAL

## 2019-07-22 MED ORDER — POTASSIUM CHLORIDE SR 10 MEQ TAB
10 mEq | ORAL | Status: AC
Start: 2019-07-22 — End: 2019-07-22
  Administered 2019-07-22: 14:00:00 via ORAL

## 2019-07-22 MED ORDER — METHADONE 10 MG/ML ORAL CONCENTRATE
10 mg/mL | Freq: Every day | ORAL | Status: DC
Start: 2019-07-22 — End: 2019-07-22

## 2019-07-22 MED ORDER — ONDANSETRON (PF) 4 MG/2 ML INJECTION
4 mg/2 mL | Freq: Four times a day (QID) | INTRAMUSCULAR | Status: DC | PRN
Start: 2019-07-22 — End: 2019-07-25

## 2019-07-22 MED FILL — GABAPENTIN 400 MG CAP: 400 mg | ORAL | Qty: 2

## 2019-07-22 MED FILL — MELOXICAM 7.5 MG TAB: 7.5 mg | ORAL | Qty: 1

## 2019-07-22 MED FILL — ENOXAPARIN 40 MG/0.4 ML SUB-Q SYRINGE: 40 mg/0.4 mL | SUBCUTANEOUS | Qty: 0.4

## 2019-07-22 MED FILL — K-TAB 10 MEQ TABLET,EXTENDED RELEASE: 10 mEq | ORAL | Qty: 4

## 2019-07-22 MED FILL — VANCOMYCIN IN 0.9 % SODIUM CHLORIDE 1.25 GRAM/250 ML IV: 1.25 gram/250 mL | INTRAVENOUS | Qty: 250

## 2019-07-22 MED FILL — PIPERACILLIN-TAZOBACTAM 3.375 GRAM IV SOLR: 3.375 gram | INTRAVENOUS | Qty: 3.38

## 2019-07-22 MED FILL — VANCOMYCIN 10 GRAM IV SOLR: 10 gram | INTRAVENOUS | Qty: 1250

## 2019-07-22 MED FILL — METHADONE 10 MG/ML ORAL CONCENTRATE: 10 mg/mL | ORAL | Qty: 8.5

## 2019-07-22 MED FILL — MIRTAZAPINE 15 MG TAB: 15 mg | ORAL | Qty: 1

## 2019-07-22 MED FILL — METHADONE 1 MG/ML ORAL SOLN: 5 mg/ mL | ORAL | Qty: 85

## 2019-07-22 NOTE — Progress Notes (Signed)
TRANSFER - IN REPORT:    Verbal report received from [facility] RN(name) on [facility]  being received from [facility]) for routine progression of care      Report consisted of patient???s Situation, Background, Assessment and   Recommendations(SBAR).     Information from the following report(s) SBAR, Kardex and MAR was reviewed with the receiving nurse.    Opportunity for questions and clarification was provided.      Assessment completed upon patient???s arrival to unit and care assumed.

## 2019-07-22 NOTE — Progress Notes (Addendum)
2026: TRANSFER - OUT REPORT:    Verbal report given to Carleene Overlie, RN (name) on [facility]  being transferred to 2N(unit) for routine progression of care       Report consisted of patient???s Situation, Background, Assessment and   Recommendations(SBAR).     Information from the following report(s) SBAR, Kardex, ED Summary, Intake/Output, MAR and Accordion was reviewed with the receiving nurse.    Opportunity for questions and clarification was provided.      Problem: Risk for Spread of Infection  Goal: Prevent transmission of infectious organism to others  Description: Prevent the transmission of infectious organisms to other patients, staff members, and visitors.  07/22/2019 1444 by Timoteo Gaul  Outcome: Progressing Towards Goal  07/22/2019 1443 by Timoteo Gaul  Outcome: Progressing Towards Goal  Problem: Falls - Risk of  Goal: *Absence of Falls  Description: Document [name] Fall Risk and appropriate interventions in the flowsheet.  07/22/2019 1444 by Timoteo Gaul  Outcome: Progressing Towards Goal  Note: Fall Risk Interventions:       Medication Interventions: Patient to call before getting OOB, Teach patient to arise slowly    Elimination Interventions: Patient to call for help with toileting needs, Toileting schedule/hourly round    07/22/2019 1443 by Timoteo Gaul  Outcome: Progressing Towards Goal  Note: Fall Risk Interventions    Medication Interventions: Patient to call before getting OOB, Teach patient to arise slowly    Elimination Interventions: Patient to call for help with toileting needs, Toileting schedule/hourly round     Problem: Pressure Injury - Risk of  Goal: *Prevention of pressure injury  Description: Document Braden Scale and appropriate interventions in the flowsheet.  Outcome: Progressing Towards Goal  Note: Pressure Injury Interventions    Activity Interventions: Increase time out of bed, Pressure redistribution bed/mattress(bed type)     Mobility Interventions: Pressure redistribution bed/mattress (bed type)    Nutrition Interventions: Offer support with meals,snacks and hydration     Problem: Pain  Goal: *Control of Pain  Outcome: Progressing Towards Goal     Problem: General Medical Care Plan  Goal: *Vital signs within specified parameters  Outcome: Progressing Towards Goal  Goal: *Labs within defined limits  Outcome: Progressing Towards Goal  Goal: *Absence of infection signs and symptoms  Outcome: Progressing Towards Goal  Goal: *Optimal pain control at patient's stated goal  Outcome: Progressing Towards Goal  Goal: *Skin integrity maintained  Outcome: Progressing Towards Goal  Goal: *Fluid volume balance  Outcome: Progressing Towards Goal  Goal: *Optimize nutritional status  Outcome: Progressing Towards Goal  Goal: *Anxiety reduced or absent  Outcome: Progressing Towards Goal

## 2019-07-22 NOTE — Progress Notes (Signed)
Hospitalist Cross Coverage NP     Name: Anita Bell  Date of birth: January 26, 1971  MRN: [id number]  Admission Date: 07/21/2019 12:42 AM    Date of service: 07/22/2019 6:25 AM                                Overnight update:        Notified by RN patient takes methadone, not suboxone. Dose of methadone was verified by RN with methadone clinic. Ordered.     Senaida Lange FNP-C, PA-C  [phone] or TigerText

## 2019-07-22 NOTE — Progress Notes (Signed)
0606: This RN received call back from [facility]. Nolene Ebbs, Director of the clinic confimed the following:     Pt has been seen as a new pt for this clinic starting on 9-17 and they have been increasing methadone by 5mg  per day until symptoms of detox resolve. Pt is currently on methadone 85 mg liquid once daily. Her last dose was given on 9-28, she missed her 9-29 dose due to being at [facility]. The prescribing NP is Celestina Amlalo.

## 2019-07-22 NOTE — ACP (Advance Care Planning) (Signed)
Advance Care Planning     Advance Care Planning Activator (Inpatient)  Conversation Note      Date of ACP Conversation: 07/22/19     Conversation Conducted with:   Patient with Decision Making Capacity    ACP Activator: Wilber Bihari, RN    *When Decision Maker makes decisions on behalf of the incapacitated patient: Decision Maker is asked to consider and make decisions based on patient values, known preferences, or best interests.     Health Care Decision Maker:    Current Designated Health Care Decision Maker:   Primary Decision Maker: Delbert Harness - Spouse - [phone]    Secondary Decision Maker: Bryn Gulling - Parent - [phone]    Supplemental (Other) Decision Maker: Faythe Ghee - Other Relative [phone]  (If there is a valid Health Care Decision Maker named in the "Healthcare Decision Makers" box in the ACP activity, but it is not visible above, be sure to open that field and then select the health care decision maker relationship (ie "primary") in the blank space to the right of the name.) Validate  this information as still accurate & up-to-date; edit Healthcare Decision Maker field as needed.)    Note: Assess and validate information in current ACP documents, as indicated.     If no Decision Maker listed above or available through scanned documents, then:    If no Authorized Decision Maker has previously been identified, then patient chooses Health Care Decision Maker:  "Who would you like to name as your primary health care decision-maker?"    Name: Delbert Harness   Relationship: husband Phone number: [phone]  "Can this person be reached easily?" YES  "Who would you like to name as your back-up decision maker?"   Name: Bryn Gulling  Relationship: mom Phone number: [phone]  "Can this person be reached easily?" YES    Note: If the relationship of these Decision-Makers to the patient does NOT follow your state's Next of Kin hierarchy, recommend that patient complete ACP document that meets  state-specific requirements to allow them to act on the patient's behalf when appropriate.    Care Preferences    Ventilation:  "If you were in your present state of health and suddenly became very ill and were unable to breathe on your own, what would your preference be about the use of a ventilator (breathing machine) if it were available to you?"      If patient would desire the use of a ventilator (breathing machine), answer "yes", if not "no":yes    "If your health worsens and it becomes clear that your chance of recovery is unlikely, what would your preference be about the use of a ventilator (breathing machine) if it were available to you?"     Would the patient desire the use of a ventilator (breathing machine)?  YES      Resuscitation  "CPR works best to restart the heart when there is a sudden event, like a heart attack, in someone who is otherwise healthy. Unfortunately, CPR does not typically restart the heart for people who have serious health conditions or who are very sick."    "In the event your heart stopped as a result of an underlying serious health condition, would you want attempts to be made to restart your heart (answer "yes" for attempt to resuscitate) or would you prefer a natural death (answer "no" for do not attempt to resuscitate)?" yes    [x]  Yes  []  No   Educated Patient /  Decision Maker regarding differences between Advance Directives and portable DNR orders.    Length of ACP Conversation in minutes:  10    Conversation Outcomes:  [x]  ACP discussion completed  []  Existing advance directive reviewed with patient; no changes to patient's previously recorded wishes     []  New Advance Directive completed   []  Portable Do Not Resuscitate prepared for Provider review and signature  []  POLST/POST/MOLST/MOST prepared for Provider review and signature      Follow-up plan:    []  Schedule follow-up conversation to continue planning  []  Referred individual to Provider for additional  questions/concerns   []  Advised patient/agent/surrogate to review completed ACP document and update if needed with changes in condition, patient preferences or care setting     []  This note routed to one or more involved healthcare providers

## 2019-07-22 NOTE — Progress Notes (Signed)
 TRANSFER - IN REPORT:    Verbal report received from [facility] RN(name) on [facility]  being received from [employer]) for routine progression of care      Report consisted of patient's Situation, Background, Assessment and   Recommendations(SBAR).     Information from the following report(s) SBAR, Kardex and MAR was reviewed with the receiving nurse.    Opportunity for questions and clarification was provided.      Assessment completed upon patient's arrival to unit and care assumed.

## 2019-07-22 NOTE — Progress Notes (Signed)
Progress Notes by Rita Ohara, NP at 07/22/19 [phone]                Author: Rita Ohara, NP  Service: Hospitalist  Author Type: Nurse Practitioner       Filed: 07/22/19 0627  Date of Service: 07/22/19 0625  Status: Signed          Editor: Rita Ohara, NP (Nurse Practitioner)                  [facility] Coverage NP       Name: Jaquelin Rzepecki   Date of birth: 08/10/1971   MRN: [id number]   Admission Date: 07/21/2019 12:42 AM      Date of service: 07/22/2019 6:25 AM                                       Overnight update:                  Notified by RN patient takes methadone, not suboxone. Dose of methadone was verified by RN with methadone clinic. Ordered.        Senaida Lange FNP-C, PA-C   [phone] or TigerText

## 2019-07-22 NOTE — Progress Notes (Signed)
 2026: TRANSFER - OUT REPORT:    Verbal report given to Danelle, RN (name) on [facility]  being transferred to 2N(unit) for routine progression of care       Report consisted of patient's Situation, Background, Assessment and   Recommendations(SBAR).     Information from the following report(s) SBAR, Kardex, ED Summary, Intake/Output, MAR and Accordion was reviewed with the receiving nurse.    Opportunity for questions and clarification was provided.      Problem: Risk for Spread of Infection  Goal: Prevent transmission of infectious organism to others  Description: Prevent the transmission of infectious organisms to other patients, staff members, and visitors.  07/22/2019 1444 by Atanacio Lacks  Outcome: Progressing Towards Goal  07/22/2019 1443 by Atanacio Lacks  Outcome: Progressing Towards Goal  Problem: Falls - Risk of  Goal: *Absence of Falls  Description: Document Deloris Fall Risk and appropriate interventions in the flowsheet.  07/22/2019 1444 by Atanacio Lacks  Outcome: Progressing Towards Goal  Note: Fall Risk Interventions:       Medication Interventions: Patient to call before getting OOB, Teach patient to arise slowly    Elimination Interventions: Patient to call for help with toileting needs, Toileting schedule/hourly round    07/22/2019 1443 by Atanacio Lacks  Outcome: Progressing Towards Goal  Note: Fall Risk Interventions    Medication Interventions: Patient to call before getting OOB, Teach patient to arise slowly    Elimination Interventions: Patient to call for help with toileting needs, Toileting schedule/hourly round     Problem: Pressure Injury - Risk of  Goal: *Prevention of pressure injury  Description: Document Braden Scale and appropriate interventions in the flowsheet.  Outcome: Progressing Towards Goal  Note: Pressure Injury Interventions    Activity Interventions: Increase time out of bed, Pressure redistribution bed/mattress(bed type)    Mobility Interventions: Pressure  redistribution bed/mattress (bed type)    Nutrition Interventions: Offer support with meals,snacks and hydration     Problem: Pain  Goal: *Control of Pain  Outcome: Progressing Towards Goal     Problem: General Medical Care Plan  Goal: *Vital signs within specified parameters  Outcome: Progressing Towards Goal  Goal: *Labs within defined limits  Outcome: Progressing Towards Goal  Goal: *Absence of infection signs and symptoms  Outcome: Progressing Towards Goal  Goal: *Optimal pain control at patient's stated goal  Outcome: Progressing Towards Goal  Goal: *Skin integrity maintained  Outcome: Progressing Towards Goal  Goal: *Fluid volume balance  Outcome: Progressing Towards Goal  Goal: *Optimize nutritional status  Outcome: Progressing Towards Goal  Goal: *Anxiety reduced or absent  Outcome: Progressing Towards Goal

## 2019-07-22 NOTE — ACP (Advance Care Planning) (Signed)
Reason for Admission:   Admitted from home with complaints of fever. History of cocaine and heroin IV use. Most recent 07/21/19. Patient in methadone program.                   RUR Score:   14%-Low                  Plan for utilizing home health: no needs identified at this time. Independent with all ADLs.         PCP: Does not have PCP. Will give her a list of PCP for follow up. CM specialist will assist with scheduling                     Current Advanced Directive/Advance Care Plan: No AMD husband Wyvonne Lenz is NOK.                         Transition of Care Plan:    Once medically stable will likely discharge home with family.  Care Management Interventions  PCP Verified by CM: No(will offer a list of PCPs)  Mode of Transport at Discharge: (Husband car)  Current Support Network: Lives with Spouse, Own Home  Confirm Follow Up Transport: Self  The Patient and/or Patient Representative was Provided with a Choice of Provider and Agrees with the Discharge Plan?: (spouse Wyvonne Lenz)  [place] Provided?: No  Raymond Gurney, RN CRM  Ext [phone]

## 2019-07-22 NOTE — Progress Notes (Signed)
Progress  Notes by Cheryll Cockayne, MD at 07/22/19 1656                Author: Cheryll Cockayne, MD  Service: Hospitalist  Author Type: Physician       Filed: 07/22/19 1705  Date of Service: 07/22/19 1656  Status: Signed          Editor: Cheryll Cockayne, MD (Physician)                                                Hospitalist Progress Note         Hospital summary: Anita Bell is a 48 y.o. female past medical history significant for polysubstance abuse, psychiatric disorders presented emergency room complaining on a fever.  She stated  that today she started having fevers and chills at home.  Unknown how high it was temperature at home.  She reports using cocaine and heroin around 7 PM and afterward she started feeling feverish.  She denies any symptoms such as headache, cough, shortness  of breath, chest pain, sore throat, runny nose, nausea, vomiting, diarrhea, urinary symptom.  On arrival to the emergency room patient was found to be febrile with a temperature of 100.9 lethargic but arousable.  Work-up overall unremarkable.  Given  her risk factor admission was requested.  Patient awake and alert at this time 07/21/2019         Assessment/Plan:   Fever (POA):??unknown etiology.??   - concern for bacteremia/endocarditis given hx IV drug use and new murmur vs viral infection.    -COVID 19 test negative   -Blood culture NGTD   - normal wbc, lactic. Elevated  pro cal 19.55    - CXR: no acute process    - c/w IV zosyn. DCed IV vanco    ??   ??Systolic Murmur: new finding as per pt report.    - Echocardiogram ordered   ??   Polysubstance abuse:   - UDS: positive for cocaine, methadone, opiates and THC   - Monitor for signs of withdrawal. Supportive care      Hx Chronic back pain    - on methadone and gabapentin       Hypokalemia - replaced       Code status: Full   DVT prophylaxis: Lovenox    Disposition: TBD. Home likely when ready   ----------------------------------------------      CC: Fever      S: Patient seen  and examined at bedside this morning . She feels better. No fevers, waiting for covid test      Review of Systems:   A comprehensive review of systems was negative.      O:   Visit Vitals      BP  120/65 (BP 1 Location: Left arm, BP Patient Position: At rest)        Pulse  67     Temp  97.9 ??F (36.6 ??C)     Resp  12     Ht  5\' 3"  (1.6 m)     Wt  84 kg (185 lb 3 oz)     SpO2  95%        BMI  32.80 kg/m??           PHYSICAL EXAM:   Gen: NAD   HEENT: anicteric sclerae, normal conjunctiva, oropharynx clear, MM moist  Neck: supple, trachea midline, no adenopathy   Heart: RRR   Lungs: CTA b/l, non-labored respirations   Abd: soft, NT, ND, BS+, no organomegaly   Extr: warm   Skin: dry, no rash   Neuro:  normal speech, moves all extremities            Intake/Output Summary (Last 24 hours) at 07/22/2019 1656   Last data filed at 07/22/2019 0521     Gross per 24 hour        Intake  100 ml        Output  100 ml        Net  0 ml            Recent labs & imaging reviewed:     Recent Results (from the past 24 hour(s))     PROTHROMBIN TIME + INR          Collection Time: 07/22/19  6:30 AM         Result  Value  Ref Range            INR  1.1  0.9 - 1.1         Prothrombin time  11.3 (H)  9.0 - 11.1 sec       PTT          Collection Time: 07/22/19  6:30 AM         Result  Value  Ref Range            aPTT  28.2  22.1 - 32.0 sec       aPTT, therapeutic range       58.0 - 77.0 SECS       FIBRINOGEN          Collection Time: 07/22/19  6:30 AM         Result  Value  Ref Range            Fibrinogen  276  200 - 475 mg/dL       D DIMER          Collection Time: 07/22/19  6:30 AM         Result  Value  Ref Range            D-dimer  1.54 (H)  0.00 - 0.65 mg/L FEU       METABOLIC PANEL, BASIC          Collection Time: 07/22/19  6:30 AM         Result  Value  Ref Range            Sodium  142  136 - 145 mmol/L       Potassium  3.2 (L)  3.5 - 5.1 mmol/L       Chloride  111 (H)  97 - 108 mmol/L       CO2  25  21 - 32 mmol/L       Anion gap  6  5 -  15 mmol/L       Glucose  80  65 - 100 mg/dL       BUN  8  6 - 20 MG/DL       Creatinine  1.01  0.55 - 1.02 MG/DL       BUN/Creatinine ratio  8 (L)  12 - 20         GFR est AA  >60  >60 ml/min/1.34m2       GFR est non-AA  59 (L)  >60 ml/min/1.33m2       Calcium  8.5  8.5 - 10.1 MG/DL       CBC WITH AUTOMATED DIFF          Collection Time: 07/22/19  6:30 AM         Result  Value  Ref Range            WBC  5.7  3.6 - 11.0 K/uL       RBC  3.73 (L)  3.80 - 5.20 M/uL       HGB  10.1 (L)  11.5 - 16.0 g/dL       HCT  32.2 (L)  35.0 - 47.0 %       MCV  86.3  80.0 - 99.0 FL       MCH  27.1  26.0 - 34.0 PG       MCHC  31.4  30.0 - 36.5 g/dL       RDW  15.4 (H)  11.5 - 14.5 %       PLATELET  204  150 - 400 K/uL       MPV  11.9  8.9 - 12.9 FL       NRBC  0.0  0 PER 100 WBC       ABSOLUTE NRBC  0.00  0.00 - 0.01 K/uL       NEUTROPHILS  52  32 - 75 %       LYMPHOCYTES  37  12 - 49 %       MONOCYTES  7  5 - 13 %       EOSINOPHILS  4  0 - 7 %       BASOPHILS  0  0 - 1 %       IMMATURE GRANULOCYTES  0  0.0 - 0.5 %       ABS. NEUTROPHILS  3.0  1.8 - 8.0 K/UL       ABS. LYMPHOCYTES  2.1  0.8 - 3.5 K/UL       ABS. MONOCYTES  0.4  0.0 - 1.0 K/UL       ABS. EOSINOPHILS  0.2  0.0 - 0.4 K/UL       ABS. BASOPHILS  0.0  0.0 - 0.1 K/UL       ABS. IMM. GRANS.  0.0  0.00 - 0.04 K/UL            DF  AUTOMATED             Recent Labs            07/22/19   0630  07/21/19   0127     WBC  5.7  9.0     HGB  10.1*  10.8*     HCT  32.2*  34.2*         PLT  204  220          Recent Labs            07/22/19   0630  07/21/19   0127     NA  142  138     K  3.2*  3.2*     CL  111*  105     CO2  25  25     BUN  8  9     CREA  1.01  1.06*     GLU  80  114*  CA  8.5  9.1          Recent Labs           07/21/19   0127     ALT  25     AP  78     TBILI  0.6     TP  8.0     ALB  3.6        GLOB  4.4*          Recent Labs            07/22/19   0630  07/21/19   0619     INR  1.1  1.1     PTP  11.3*  11.5*         APTT  28.2  26.8           Recent Labs            07/21/19   0619        FERR  66           Lab Results         Component  Value  Date/Time            Folate  5.4  03/14/2019 05:47 AM         No results for input(s): PH, PCO2, PO2 in the last 72 hours.     Recent Labs           07/21/19   0127     CPK  125        TROIQ  <0.05        No results found for: CHOL, CHOLX, CHLST, CHOLV, HDL, HDLP, LDL, LDLC, DLDLP, TGLX, TRIGL, TRIGP, CHHD, CHHDX     Lab Results         Component  Value  Date/Time            Glucose (POC)  144 (H)  03/14/2019 09:56 AM          Lab Results         Component  Value  Date/Time            Color  YELLOW/STRAW  07/21/2019 03:44 AM       Appearance  CLEAR  07/21/2019 03:44 AM       Specific gravity  1.008  07/21/2019 03:44 AM       Specific gravity  1.029  09/14/2009 08:45 PM       pH (UA)  7.0  07/21/2019 03:44 AM       Protein  Negative  07/21/2019 03:44 AM       Glucose  Negative  07/21/2019 03:44 AM       Ketone  Negative  07/21/2019 03:44 AM       Bilirubin  Negative  07/21/2019 03:44 AM       Urobilinogen  1.0  07/21/2019 03:44 AM       Nitrites  Negative  07/21/2019 03:44 AM       Leukocyte Esterase  TRACE (A)  07/21/2019 03:44 AM       Epithelial cells  FEW  07/21/2019 03:44 AM       Bacteria  Negative  07/21/2019 03:44 AM       WBC  0-4  07/21/2019 03:44 AM            RBC  0-5  07/21/2019 03:44 AM           Med list reviewed  Current Facility-Administered Medications          Medication  Dose  Route  Frequency           ?  ondansetron (ZOFRAN) injection 4 mg   4 mg  IntraVENous  Q6H PRN           ?  methadone (DOLOPHINE) 5 mg/5 mL oral solution 85 mg   85 mg  Oral  DAILY           ?  sodium chloride (NS) flush 5-40 mL   5-40 mL  IntraVENous  Q8H     ?  sodium chloride (NS) flush 5-40 mL   5-40 mL  IntraVENous  PRN     ?  acetaminophen (TYLENOL) tablet 650 mg   650 mg  Oral  Q6H PRN          Or           ?  acetaminophen (TYLENOL) suppository 650 mg   650 mg  Rectal  Q6H PRN     ?  polyethylene glycol (MIRALAX) packet 17 g   17 g   Oral  DAILY PRN     ?  enoxaparin (LOVENOX) injection 40 mg   40 mg  SubCUTAneous  DAILY     ?  piperacillin-tazobactam (ZOSYN) 3.375 g in 0.9% sodium chloride (MBP/ADV) 100 mL   3.375 g  IntraVENous  Q8H     ?  vancomycin pharmacy dosing     Other  Rx Dosing/Monitoring     ?  vancomycin (VANCOCIN) 1250 mg in NS 250 ml infusion   1,250 mg  IntraVENous  Q16H     ?  gabapentin (NEURONTIN) capsule 800 mg   800 mg  Oral  TID     ?  meloxicam (MOBIC) tablet 7.5 mg   7.5 mg  Oral  DAILY           ?  mirtazapine (REMERON) tablet 15 mg   15 mg  Oral  QHS           Care Plan discussed with:   Patient/Family and Nurse      Cheryll Cockayne, MD   Internal Medicine   Date of Service: 07/22/2019

## 2019-07-22 NOTE — ACP (Advance Care Planning) (Signed)
Advance Care Planning     Advance Care Planning Activator (Inpatient)  Conversation Note      Date of ACP Conversation: 07/22/19     Conversation Conducted with:   Patient with Decision Making Capacity    ACP Activator: Raymond Gurney, RN    *When Decision Maker makes decisions on behalf of the incapacitated patient: Decision Maker is asked to consider and make decisions based on patient values, known preferences, or best interests.     Health Care Decision Maker:    Current Designated Health Care Decision Maker:   Primary Decision Maker: Wyvonne Lenz - Spouse - [phone]    Secondary Decision Maker: Roxan Hockey - Parent - [phone]    Supplemental (Other) Decision Maker: Johnell Comings - Other Relative [phone]  (If there is a [place] named in the "[place]" box in the ACP activity, but it is not visible above, be sure to open that field and then select the health care decision maker relationship (ie "primary") in the blank space to the right of the name.) Validate  this information as still accurate & up-to-date; edit [place] field as needed.)    Note: Assess and validate information in current ACP documents, as indicated.     If no Decision Maker listed above or available through scanned documents, then:    If no Authorized Decision Maker has previously been identified, then patient chooses [place]:  "Who would you like to name as your primary health care decision-maker?"    Name: Wyvonne Lenz   Relationship: husband Phone number: [phone]  "Can this person be reached easily?" YES  "Who would you like to name as your back-up decision maker?"   Name: Roxan Hockey  Relationship: mom Phone number: [phone]  "Can this person be reached easily?" YES    Note: If the relationship of these Decision-Makers to the patient does NOT follow your state's Next of Kin hierarchy, recommend that patient complete  ACP document that meets state-specific requirements to allow them to act on the patient's behalf when appropriate.    Care Preferences    Ventilation:  "If you were in your present state of health and suddenly became very ill and were unable to breathe on your own, what would your preference be about the use of a ventilator (breathing machine) if it were available to you?"      If patient would desire the use of a ventilator (breathing machine), answer "yes", if not "no":yes    "If your health worsens and it becomes clear that your chance of recovery is unlikely, what would your preference be about the use of a ventilator (breathing machine) if it were available to you?"     Would the patient desire the use of a ventilator (breathing machine)?  YES      Resuscitation  "CPR works best to restart the heart when there is a sudden event, like a heart attack, in someone who is otherwise healthy. Unfortunately, CPR does not typically restart the heart for people who have serious health conditions or who are very sick."    "In the event your heart stopped as a result of an underlying serious health condition, would you want attempts to be made to restart your heart (answer "yes" for attempt to resuscitate) or would you prefer a natural death (answer "no" for do not attempt to resuscitate)?" yes    [x]  Yes  []  No   Educated Patient /  Decision Maker regarding differences between Advance Directives and portable DNR orders.    Length of ACP Conversation in minutes:  10    Conversation Outcomes:  [x]  ACP discussion completed  []  Existing advance directive reviewed with patient; no changes to patient's previously recorded wishes     []  New Advance Directive completed   []  Portable Do Not Resuscitate prepared for Provider review and signature  []  POLST/POST/MOLST/MOST prepared for Provider review and signature      Follow-up plan:    []  Schedule follow-up conversation to continue planning   []  Referred individual to Provider for additional questions/concerns   []  Advised patient/agent/surrogate to review completed ACP document and update if needed with changes in condition, patient preferences or care setting     []  This note routed to one or more involved healthcare providers

## 2019-07-22 NOTE — ACP (Advance Care Planning) (Signed)
Reason for Admission:   Admitted from home with complaints of fever. History of cocaine and heroin IV use. Most recent 07/21/19. Patient in methadone program.                   RUR Score:   14%-Low                  Plan for utilizing home health: no needs identified at this time. Independent with all ADLs.         PCP: Does not have PCP. Will give her a list of PCP for follow up. CM specialist will assist with scheduling                     Current Advanced Directive/Advance Care Plan: No AMD husband Wyvonne Lenz is NOK.                         Transition of Care Plan:    Once medically stable will likely discharge home with family.  Care Management Interventions  PCP Verified by CM: No(will offer a list of PCPs)  Mode of Transport at Discharge: (Husband car)  Current Support Network: Lives with Spouse, Own Home  Confirm Follow Up Transport: Self  The Patient and/or Patient Representative was Provided with a Choice of Provider and Agrees with the Discharge Plan?: (spouse Wyvonne Lenz)  [place] Provided?: No  Raymond Gurney, RN CRM  Ext [phone]

## 2019-07-22 NOTE — Progress Notes (Signed)
0606: This RN received call back from [facility]. Geralynn Ochs, Director of the clinic confimed the following:     Pt has been seen as a new pt for this clinic starting on 9-17 and they have been increasing methadone by 5mg  per day until symptoms of detox resolve. Pt is currently on methadone 85 mg liquid once daily. Her last dose was given on 9-28, she missed her 9-29 dose due to being at [facility]. The prescribing NP is Celestina Amlalo.

## 2019-07-22 NOTE — Progress Notes (Signed)
Hospitalist Progress Note      Hospital summary: Anita Bell is a 48 y.o. female past medical history significant for polysubstance abuse, psychiatric disorders presented emergency room complaining on a fever.  She stated that today she started having fevers and chills at home.  Unknown how high it was temperature at home.  She reports using cocaine and heroin around 7 PM and afterward she started feeling feverish.  She denies any symptoms such as headache, cough, shortness of breath, chest pain, sore throat, runny nose, nausea, vomiting, diarrhea, urinary symptom.  On arrival to the emergency room patient was found to be febrile with a temperature of 100.9 lethargic but arousable.  Work-up overall unremarkable.  Given her risk factor admission was requested.  Patient awake and alert at this time 07/21/2019      Assessment/Plan:  Fever (POA):??unknown etiology.??  - concern for bacteremia/endocarditis given hx IV drug use and new murmur vs viral infection.   ???COVID 19 test negative  ???Blood culture NGTD  - normal wbc, lactic. Elevated  pro cal 19.55   - CXR: no acute process   ??? c/w IV zosyn. DCed IV vanco   ??  ??Systolic Murmur: new finding as per pt report.   - Echocardiogram ordered  ??  Polysubstance abuse:  - UDS: positive for cocaine, methadone, opiates and THC  - Monitor for signs of withdrawal. Supportive care    Hx Chronic back pain   - on methadone and gabapentin     Hypokalemia - replaced     Code status: Full  DVT prophylaxis: Lovenox   Disposition: TBD. Home likely when ready  ----------------------------------------------    CC: Fever    S: Patient seen and examined at bedside this morning . She feels better. No fevers, waiting for covid test    Review of Systems:  A comprehensive review of systems was negative.    O:  Visit Vitals  BP 120/65 (BP 1 Location: Left arm, BP Patient Position: At rest)   Pulse 67   Temp 97.9 ??F (36.6 ??C)   Resp 12   Ht 5\' 3"  (1.6 m)    Wt 84 kg (185 lb 3 oz)   SpO2 95%   BMI 32.80 kg/m??       PHYSICAL EXAM:  Gen: NAD  HEENT: anicteric sclerae, normal conjunctiva, oropharynx clear, MM moist  Neck: supple, trachea midline, no adenopathy  Heart: RRR  Lungs: CTA b/l, non-labored respirations  Abd: soft, NT, ND, BS+, no organomegaly  Extr: warm  Skin: dry, no rash  Neuro:  normal speech, moves all extremities        Intake/Output Summary (Last 24 hours) at 07/22/2019 1656  Last data filed at 07/22/2019 0521  Gross per 24 hour   Intake 100 ml   Output 100 ml   Net 0 ml        Recent labs & imaging reviewed:  Recent Results (from the past 24 hour(s))   PROTHROMBIN TIME + INR    Collection Time: 07/22/19  6:30 AM   Result Value Ref Range    INR 1.1 0.9 - 1.1      Prothrombin time 11.3 (H) 9.0 - 11.1 sec   PTT    Collection Time: 07/22/19  6:30 AM   Result Value Ref Range    aPTT 28.2 22.1 - 32.0 sec    aPTT, therapeutic range     58.0 - 77.0 SECS   FIBRINOGEN    Collection Time: 07/22/19  6:30  AM   Result Value Ref Range    Fibrinogen 276 200 - 475 mg/dL   D DIMER    Collection Time: 07/22/19  6:30 AM   Result Value Ref Range    D-dimer 1.54 (H) 0.00 - 0.65 mg/L FEU   METABOLIC PANEL, BASIC    Collection Time: 07/22/19  6:30 AM   Result Value Ref Range    Sodium 142 136 - 145 mmol/L    Potassium 3.2 (L) 3.5 - 5.1 mmol/L    Chloride 111 (H) 97 - 108 mmol/L    CO2 25 21 - 32 mmol/L    Anion gap 6 5 - 15 mmol/L    Glucose 80 65 - 100 mg/dL    BUN 8 6 - 20 MG/DL    Creatinine 1.01 0.55 - 1.02 MG/DL    BUN/Creatinine ratio 8 (L) 12 - 20      GFR est AA >60 >60 ml/min/1.51m2    GFR est non-AA 59 (L) >60 ml/min/1.72m2    Calcium 8.5 8.5 - 10.1 MG/DL   CBC WITH AUTOMATED DIFF    Collection Time: 07/22/19  6:30 AM   Result Value Ref Range    WBC 5.7 3.6 - 11.0 K/uL    RBC 3.73 (L) 3.80 - 5.20 M/uL    HGB 10.1 (L) 11.5 - 16.0 g/dL    HCT 32.2 (L) 35.0 - 47.0 %    MCV 86.3 80.0 - 99.0 FL    MCH 27.1 26.0 - 34.0 PG    MCHC 31.4 30.0 - 36.5 g/dL     RDW 15.4 (H) 11.5 - 14.5 %    PLATELET 204 150 - 400 K/uL    MPV 11.9 8.9 - 12.9 FL    NRBC 0.0 0 PER 100 WBC    ABSOLUTE NRBC 0.00 0.00 - 0.01 K/uL    NEUTROPHILS 52 32 - 75 %    LYMPHOCYTES 37 12 - 49 %    MONOCYTES 7 5 - 13 %    EOSINOPHILS 4 0 - 7 %    BASOPHILS 0 0 - 1 %    IMMATURE GRANULOCYTES 0 0.0 - 0.5 %    ABS. NEUTROPHILS 3.0 1.8 - 8.0 K/UL    ABS. LYMPHOCYTES 2.1 0.8 - 3.5 K/UL    ABS. MONOCYTES 0.4 0.0 - 1.0 K/UL    ABS. EOSINOPHILS 0.2 0.0 - 0.4 K/UL    ABS. BASOPHILS 0.0 0.0 - 0.1 K/UL    ABS. IMM. GRANS. 0.0 0.00 - 0.04 K/UL    DF AUTOMATED       Recent Labs     07/22/19  0630 07/21/19  0127   WBC 5.7 9.0   HGB 10.1* 10.8*   HCT 32.2* 34.2*   PLT 204 220     Recent Labs     07/22/19  0630 07/21/19  0127   NA 142 138   K 3.2* 3.2*   CL 111* 105   CO2 25 25   BUN 8 9   CREA 1.01 1.06*   GLU 80 114*   CA 8.5 9.1     Recent Labs     07/21/19  0127   ALT 25   AP 78   TBILI 0.6   TP 8.0   ALB 3.6   GLOB 4.4*     Recent Labs     07/22/19  0630 07/21/19  0619   INR 1.1 1.1   PTP 11.3* 11.5*   APTT 28.2 26.8  Recent Labs     07/21/19  0619   FERR 39      Lab Results   Component Value Date/Time    Folate 5.4 03/14/2019 05:47 AM      No results for input(s): PH, PCO2, PO2 in the last 72 hours.  Recent Labs     07/21/19  0127   CPK 125   TROIQ <0.05     No results found for: CHOL, CHOLX, CHLST, CHOLV, HDL, HDLP, LDL, LDLC, DLDLP, TGLX, TRIGL, TRIGP, CHHD, CHHDX  Lab Results   Component Value Date/Time    Glucose (POC) 144 (H) 03/14/2019 09:56 AM     Lab Results   Component Value Date/Time    Color YELLOW/STRAW 07/21/2019 03:44 AM    Appearance CLEAR 07/21/2019 03:44 AM    Specific gravity 1.008 07/21/2019 03:44 AM    Specific gravity 1.029 09/14/2009 08:45 PM    pH (UA) 7.0 07/21/2019 03:44 AM    Protein Negative 07/21/2019 03:44 AM    Glucose Negative 07/21/2019 03:44 AM    Ketone Negative 07/21/2019 03:44 AM    Bilirubin Negative 07/21/2019 03:44 AM    Urobilinogen 1.0 07/21/2019 03:44 AM     Nitrites Negative 07/21/2019 03:44 AM    Leukocyte Esterase TRACE (A) 07/21/2019 03:44 AM    Epithelial cells FEW 07/21/2019 03:44 AM    Bacteria Negative 07/21/2019 03:44 AM    WBC 0-4 07/21/2019 03:44 AM    RBC 0-5 07/21/2019 03:44 AM       Med list reviewed  Current Facility-Administered Medications   Medication Dose Route Frequency   ??? ondansetron (ZOFRAN) injection 4 mg  4 mg IntraVENous Q6H PRN   ??? methadone (DOLOPHINE) 5 mg/5 mL oral solution 85 mg  85 mg Oral DAILY   ??? sodium chloride (NS) flush 5-40 mL  5-40 mL IntraVENous Q8H   ??? sodium chloride (NS) flush 5-40 mL  5-40 mL IntraVENous PRN   ??? acetaminophen (TYLENOL) tablet 650 mg  650 mg Oral Q6H PRN    Or   ??? acetaminophen (TYLENOL) suppository 650 mg  650 mg Rectal Q6H PRN   ??? polyethylene glycol (MIRALAX) packet 17 g  17 g Oral DAILY PRN   ??? enoxaparin (LOVENOX) injection 40 mg  40 mg SubCUTAneous DAILY   ??? piperacillin-tazobactam (ZOSYN) 3.375 g in 0.9% sodium chloride (MBP/ADV) 100 mL  3.375 g IntraVENous Q8H   ??? vancomycin pharmacy dosing   Other Rx Dosing/Monitoring   ??? vancomycin (VANCOCIN) 1250 mg in NS 250 ml infusion  1,250 mg IntraVENous Q16H   ??? gabapentin (NEURONTIN) capsule 800 mg  800 mg Oral TID   ??? meloxicam (MOBIC) tablet 7.5 mg  7.5 mg Oral DAILY   ??? mirtazapine (REMERON) tablet 15 mg  15 mg Oral QHS       Care Plan discussed with:  Patient/Family and Nurse    Cheryll Cockayne, MD  Internal Medicine  Date of Service: 07/22/2019

## 2019-07-23 ENCOUNTER — Inpatient Hospital Stay: Admit: 2019-07-23 | Payer: MEDICAID

## 2019-07-23 LAB — TRANSTHORACIC ECHOCARDIOGRAM (TTE) COMPLETE (CONTRAST/BUBBLE/3D PRN)
AV Area by Peak Velocity: 1.99 cm2
AV Area by Peak Velocity: 1.99 cm2
AV Area by VTI: 1.91 cm2
AV Area by VTI: 1.91 cm2
AV Mean Gradient: 4.89 mmHg
AV Peak Gradient: 10.39 mmHg
AV Peak Velocity: 161.18 cm/s
AV VTI: 32.9 cm
Aortic Root: 2.71 cm
E/E' Lateral: 5.38
E/E' Ratio (Averaged): 7.31
E/E' Septal: 9.24
Est. RA Pressure: 3 mmHg
IVSd: 0.69 cm (ref 0.6–0.9)
LA Area 4C: 19.09 cm2
LA Major Axis: 3.31 cm
LA Minor Axis: 1.77 cm
LA Volume 2C: 56.44 mL — AB (ref 22–52)
LA Volume 4C: 49.34 mL (ref 22–52)
LA Volume BP: 57.21 mL (ref 22–52)
LA Volume Index 2C: 30.17 ml/m2 (ref 16–28)
LA Volume Index 4C: 26.37 ml/m2 (ref 16–28)
LA Volume Index BP: 30.58 ml/m2 (ref 16–28)
LV E' Lateral Velocity: 17.91 cm/s
LV E' Septal Velocity: 10.42 cm/s
LV Mass 2D Index: 63.7 g/m2 (ref 43–95)
LV Mass 2D: 119.2 g (ref 67–162)
LVIDd: 4.72 cm (ref 3.9–5.3)
LVIDs: 2.95 cm
LVOT Diameter: 1.88 cm
LVOT Peak Gradient: 5.36 mmHg
LVOT Peak Velocity: 115.8 cm/s
LVOT SV: 62.8 mL
LVOT VTI: 22.66 cm
LVPWd: 0.87 cm (ref 0.6–0.9)
Left Ventricular Ejection Fraction: 63
MV A Velocity: 54.66 cm/s
MV Area by PHT: 4.63 cm2
MV E Velocity: 96.3 cm/s
MV E Wave Deceleration Time: 0.16 s
MV E/A: 1.76
MV PHT: 0.05 s
PR Max Velocity: 113.58 cm/s
PV Max Velocity: 98.5 cm/s
PV Systolic Peak Instantaneous Gradient: 3.88 mmHg
PV Systolic Peak Instantaneous Gradient: 5.16 mmHg
RVIDd: 3.83 cm
RVSP: 28.15 mmHg
RVSP: 30.15 mmHg
RVSP: 32.02 mmHg
RVSP: 32.14 mmHg
RVSP: 32.14 mmHg
TAPSE: 2.27 cm — AB (ref 1.5–2)
TR Max Velocity: 250.74 cm/s
TR Max Velocity: 260.54 cm/s
TR Max Velocity: 269.33 cm/s
TR Max Velocity: 269.9 cm/s
TR Max Velocity: 269.9 cm/s
TR Peak Gradient: 25.15 mmHg
TR Peak Gradient: 27.15 mmHg
TR Peak Gradient: 29.02 mmHg
TR Peak Gradient: 29.14 mmHg
TR Peak Gradient: 29.14 mmHg

## 2019-07-23 LAB — BASIC METABOLIC PANEL
Anion Gap: 6 mmol/L (ref 5–15)
BUN: 11 MG/DL (ref 6–20)
Bun/Cre Ratio: 11 — ABNORMAL LOW (ref 12–20)
CO2: 26 mmol/L (ref 21–32)
Calcium: 8.8 MG/DL (ref 8.5–10.1)
Chloride: 111 mmol/L — ABNORMAL HIGH (ref 97–108)
Creatinine: 0.97 MG/DL (ref 0.55–1.02)
EGFR IF NonAfrican American: >60 mL/min/{1.73_m2} (ref >60)
GFR African American: >60 mL/min/{1.73_m2} (ref >60)
Glucose: 97 mg/dL (ref 65–100)
Potassium: 4 mmol/L (ref 3.5–5.1)
Sodium: 143 mmol/L (ref 136–145)

## 2019-07-23 LAB — CBC WITH AUTO DIFFERENTIAL
Basophils %: 0 % (ref 0–1)
Basophils Absolute: 0 10*3/uL (ref 0.0–0.1)
Eosinophils %: 8 % — ABNORMAL HIGH (ref 0–7)
Eosinophils Absolute: 0.4 10*3/uL (ref 0.0–0.4)
Granulocyte Absolute Count: 0 10*3/uL (ref 0.00–0.04)
Hematocrit: 31.3 % — ABNORMAL LOW (ref 35.0–47.0)
Hemoglobin: 9.9 g/dL — ABNORMAL LOW (ref 11.5–16.0)
Immature Granulocytes: 0 % (ref 0.0–0.5)
Lymphocytes %: 55 % — ABNORMAL HIGH (ref 12–49)
Lymphocytes Absolute: 2.6 10*3/uL (ref 0.8–3.5)
MCH: 27.3 PG (ref 26.0–34.0)
MCHC: 31.6 g/dL (ref 30.0–36.5)
MCV: 86.5 FL (ref 80.0–99.0)
MPV: 11.2 FL (ref 8.9–12.9)
Monocytes %: 11 % (ref 5–13)
Monocytes Absolute: 0.5 10*3/uL (ref 0.0–1.0)
NRBC Absolute: 0 10*3/uL (ref 0.00–0.01)
Neutrophils %: 26 % — ABNORMAL LOW (ref 32–75)
Neutrophils Absolute: 1.3 10*3/uL — ABNORMAL LOW (ref 1.8–8.0)
Nucleated RBCs: 0 PER 100 WBC
Platelets: 203 10*3/uL (ref 150–400)
RBC: 3.62 M/uL — ABNORMAL LOW (ref 3.80–5.20)
RDW: 15.7 % — ABNORMAL HIGH (ref 11.5–14.5)
WBC: 4.8 10*3/uL (ref 3.6–11.0)

## 2019-07-23 LAB — ECHO ADULT COMPLETE
AV Area by Peak Velocity: 1.99 cm2
AV Area by Peak Velocity: 1.99 cm2
AV Area by VTI: 1.91 cm2
AV Area by VTI: 1.91 cm2
AV Mean Gradient: 4.89 mm[Hg]
AV Peak Gradient: 10.39 mm[Hg]
AV Peak Velocity: 161.18 cm/s
AV VTI: 32.9 cm
Aortic Root: 2.71 cm
E/E' Lateral: 5.38
E/E' Ratio (Averaged): 7.31
E/E' Septal: 9.24
Est. RA Pressure: 3 mm[Hg]
IVSd: 0.69 cm (ref 0.6–0.9)
LA Area 4C: 19.09 cm2
LA Major Axis: 3.31 cm
LA Minor Axis: 1.77 cm
LA Volume 2C: 56.44 mL — AB (ref 22–52)
LA Volume 4C: 49.34 mL (ref 22–52)
LA Volume BP: 57.21 mL (ref 22–52)
LA Volume Index 2C: 30.17 mL/m2 (ref 16–28)
LA Volume Index 4C: 26.37 mL/m2 (ref 16–28)
LA Volume Index BP: 30.58 mL/m2 (ref 16–28)
LV E' Lateral Velocity: 17.91 cm/s
LV E' Septal Velocity: 10.42 cm/s
LV Mass 2D Index: 63.7 g/m2 (ref 43–95)
LV Mass 2D: 119.2 g (ref 67–162)
LVIDd: 4.72 cm (ref 3.9–5.3)
LVIDs: 2.95 cm
LVOT Diameter: 1.88 cm
LVOT Peak Gradient: 5.36 mm[Hg]
LVOT Peak Velocity: 115.8 cm/s
LVOT SV: 62.8 mL
LVOT VTI: 22.66 cm
LVPWd: 0.87 cm (ref 0.6–0.9)
MV A Velocity: 54.66 cm/s
MV Area by PHT: 4.63 cm2
MV E Velocity: 96.3 cm/s
MV E Wave Deceleration Time: 0.16 s
MV E/A: 1.76
MV PHT: 0.05 s
PR Max Velocity: 113.58 cm/s
PV Max Velocity: 98.5 cm/s
PV Systolic Peak Instantaneous Gradient: 3.88 mm[Hg]
PV Systolic Peak Instantaneous Gradient: 5.16 mm[Hg]
RVIDd: 3.83 cm
RVSP: 28.15 mm[Hg]
RVSP: 30.15 mm[Hg]
RVSP: 32.02 mm[Hg]
RVSP: 32.14 mm[Hg]
RVSP: 32.14 mm[Hg]
TAPSE: 2.27 cm — AB (ref 1.5–2.0)
TR Max Velocity: 250.74 cm/s
TR Max Velocity: 260.54 cm/s
TR Max Velocity: 269.33 cm/s
TR Max Velocity: 269.9 cm/s
TR Max Velocity: 269.9 cm/s
TR Peak Gradient: 25.15 mm[Hg]
TR Peak Gradient: 27.15 mm[Hg]
TR Peak Gradient: 29.02 mm[Hg]
TR Peak Gradient: 29.14 mm[Hg]
TR Peak Gradient: 29.14 mm[Hg]

## 2019-07-23 LAB — CBC WITH AUTOMATED DIFF
ABS. BASOPHILS: 0 10*3/uL (ref 0.0–0.1)
ABS. EOSINOPHILS: 0.4 10*3/uL (ref 0.0–0.4)
ABS. IMM. GRANS.: 0 10*3/uL (ref 0.00–0.04)
ABS. LYMPHOCYTES: 2.6 10*3/uL (ref 0.8–3.5)
ABS. MONOCYTES: 0.5 10*3/uL (ref 0.0–1.0)
ABS. NEUTROPHILS: 1.3 10*3/uL — ABNORMAL LOW (ref 1.8–8.0)
ABSOLUTE NRBC: 0 10*3/uL (ref 0.00–0.01)
BASOPHILS: 0 % (ref 0–1)
EOSINOPHILS: 8 % — ABNORMAL HIGH (ref 0–7)
HCT: 31.3 % — ABNORMAL LOW (ref 35.0–47.0)
HGB: 9.9 g/dL — ABNORMAL LOW (ref 11.5–16.0)
IMMATURE GRANULOCYTES: 0 % (ref 0.0–0.5)
LYMPHOCYTES: 55 % — ABNORMAL HIGH (ref 12–49)
MCH: 27.3 PG (ref 26.0–34.0)
MCHC: 31.6 g/dL (ref 30.0–36.5)
MCV: 86.5 FL (ref 80.0–99.0)
MONOCYTES: 11 % (ref 5–13)
MPV: 11.2 FL (ref 8.9–12.9)
NEUTROPHILS: 26 % — ABNORMAL LOW (ref 32–75)
NRBC: 0 PER 100 WBC
PLATELET: 203 10*3/uL (ref 150–400)
RBC: 3.62 M/uL — ABNORMAL LOW (ref 3.80–5.20)
RDW: 15.7 % — ABNORMAL HIGH (ref 11.5–14.5)
WBC: 4.8 10*3/uL (ref 3.6–11.0)

## 2019-07-23 LAB — METABOLIC PANEL, BASIC
Anion gap: 6 mmol/L (ref 5–15)
BUN/Creatinine ratio: 11 — ABNORMAL LOW (ref 12–20)
BUN: 11 MG/DL (ref 6–20)
CO2: 26 mmol/L (ref 21–32)
Calcium: 8.8 MG/DL (ref 8.5–10.1)
Chloride: 111 mmol/L — ABNORMAL HIGH (ref 97–108)
Creatinine: 0.97 MG/DL (ref 0.55–1.02)
GFR est AA: >60 mL/min/{1.73_m2} (ref >60)
GFR est non-AA: >60 mL/min/{1.73_m2} (ref >60)
Glucose: 97 mg/dL (ref 65–100)
Potassium: 4 mmol/L (ref 3.5–5.1)
Sodium: 143 mmol/L (ref 136–145)

## 2019-07-23 MED ORDER — FLU VACCINE QS 2020-21(6MOS+)(PF) 60 MCG(15 MCGX4)/0.5 ML IM SYRINGE
60 mcg (15 mcg x 4)/0.5 mL | INTRAMUSCULAR | Status: DC
Start: 2019-07-23 — End: 2019-07-25

## 2019-07-23 MED ORDER — DIPHENHYDRAMINE 25 MG CAP
25 mg | Freq: Four times a day (QID) | ORAL | Status: DC | PRN
Start: 2019-07-23 — End: 2019-07-25
  Administered 2019-07-23 – 2019-07-24 (×3): via ORAL

## 2019-07-23 MED FILL — GABAPENTIN 400 MG CAP: 400 mg | ORAL | Qty: 2

## 2019-07-23 MED FILL — PIPERACILLIN-TAZOBACTAM 3.375 GRAM IV SOLR: 3.375 gram | INTRAVENOUS | Qty: 3.38

## 2019-07-23 MED FILL — MIRTAZAPINE 15 MG TAB: 15 mg | ORAL | Qty: 1

## 2019-07-23 MED FILL — ENOXAPARIN 40 MG/0.4 ML SUB-Q SYRINGE: 40 mg/0.4 mL | SUBCUTANEOUS | Qty: 0.4

## 2019-07-23 MED FILL — BANOPHEN 25 MG CAPSULE: 25 mg | ORAL | Qty: 1

## 2019-07-23 MED FILL — METHADONE 1 MG/ML ORAL SOLN: 5 mg/ mL | ORAL | Qty: 85

## 2019-07-23 MED FILL — MELOXICAM 7.5 MG TAB: 7.5 mg | ORAL | Qty: 1

## 2019-07-23 NOTE — Progress Notes (Signed)
Bedside shift change report given to Serena RN (oncoming nurse) by Greg RN (offgoing nurse). Report included the following information SBAR, Kardex and MAR.

## 2019-07-23 NOTE — Progress Notes (Signed)
Physical Therapy Screening:  Services are not indicated at this time.    An InBasket screening referral was triggered for physical therapy based on results obtained during the nursing admission assessment.  The patient???s chart was reviewed and the patient is not appropriate for a skilled therapy evaluation at this time.  Please consult physical therapy if any therapy needs arise.  Thank you.    Maggie Thornhill, PT

## 2019-07-23 NOTE — Progress Notes (Signed)
Problem: Falls - Risk of  Goal: *Absence of Falls  Description: Document Schmid Fall Risk and appropriate interventions in the flowsheet.  Outcome: Progressing Towards Goal  Note: Fall Risk Interventions:            Medication Interventions: Patient to call before getting OOB    Elimination Interventions: Patient to call for help with toileting needs

## 2019-07-23 NOTE — Consults (Signed)
ID Consult Note  NAME:  Anita Bell   DOB:   Apr 17, 1971   MRN:   [id number]   Date/Time:  07/23/2019 6:55 PM  Subjective:   REASON FOR CONSULT: Fever     Anita Bell is a 48 y.o. with a history of polysubstance abuse.  She also has a history of bipolar disorder well as chronic back pain.  She has had back pain since 2004.  She tells me that her pain right now is stable and has not worsened for months now.  She was admitted on September 29 because she had fever after injecting herself with heroin.  She also had cocaine that day.  She developed some chills.  She does not have any dyspnea, cough, abdominal pain, dysuria, diarrhea or other joint pains.  She does not have any rash.  Because of her symptoms she came here to [facility].  She was admitted under the hospitalist service.  Her temperature was 100.9.  White blood cell count was normal at 9.  She had blood cultures taken and these are so far negative.  He has been afebrile for 2 days now.  She does not have any complaints.      Past Medical History:   Diagnosis Date   ??? ADHD    ??? Asthma    ??? Depression    ??? Fibromyalgia    ??? Gastroenteritis    ??? Hydradenitis     suppurativa   ??? Osteoarthritis    ??? Other ill-defined conditions(799.89)     chronic back pain   ??? Psychiatric disorder       Past Surgical History:   Procedure Laterality Date   ??? HX GYN      tubes tided   Tubal ligation    Family history  She says that her mother has arthritis.  Her father has psychiatric disorder.    Social history  She does not smoke.  She drinks alcohol occasionally.  She uses recreational drugs specifically cocaine and heroin.      Allergies   Allergen Reactions   ??? Aspirin Nausea and Vomiting   ??? Aspirin Hives   ??? Ibuprofen Other (comments)     Upsets her stomach   ??? Ibuprofen Other (comments)     Abdominal pain        Home Medications:  Prior to Admission Medications   Prescriptions Last Dose Informant Patient Reported? Taking?   DULoxetine (CYMBALTA) 60 mg capsule  07/16/2019 at Unknown time  Yes Yes   Sig: Take 60 mg by mouth daily.   buprenorphine-naloxone (SUBOXONE) 8-2 mg film sublingaul film 06/23/2019 at Unknown time  Yes Yes   Sig: 1 Film by SubLINGual route three (3) times daily.   famotidine (PEPCID) 20 mg tablet 07/16/2019 at Unknown time  Yes Yes   Sig: Take 20 mg by mouth two (2) times a day.   gabapentin (NEURONTIN) 800 mg tablet 07/16/2019 at Unknown time  Yes Yes   Sig: Take 800 mg by mouth three (3) times daily.   meloxicam (MOBIC) 7.5 mg tablet 07/16/2019 at Unknown time  Yes Yes   Sig: Indications: received today   methadone (DOLOPHINE) 10 mg/mL solution 07/16/2019 at Unknown time  Yes Yes   Sig: Take 85 mg by mouth daily. Indications: symptoms from stopping treatment with opioid drugs   mirtazapine (REMERON SOL-TAB) 15 mg disintegrating tablet 07/16/2019 at Unknown time  Yes Yes   Sig: Take 15 mg by mouth nightly.   naproxen (NAPROSYN)  375 mg tablet 07/16/2019 at Unknown time  Yes Yes   Sig: Take 375 mg by mouth two (2) times daily (with meals).   risperiDONE (RisperDAL) 1 mg tablet 07/16/2019 at Unknown time  Yes Yes   Sig: Take 1 mg by mouth daily.   rizatriptan (MAXALT-MLT) 10 mg disintegrating tablet 07/16/2019 at Unknown time  Yes Yes   topiramate (TOPAMAX) 50 mg tablet 07/16/2019 at Unknown time  Yes Yes      Facility-Administered Medications: None     Hospital medications:  Current Facility-Administered Medications   Medication Dose Route Frequency   ??? influenza vaccine 2020-21 (6 mos+)(PF) (FLUARIX/FLULAVAL/FLUZONE QUAD) injection 0.5 mL  0.5 mL IntraMUSCular PRIOR TO DISCHARGE   ??? diphenhydrAMINE (BENADRYL) capsule 25 mg  25 mg Oral Q6H PRN   ??? ondansetron (ZOFRAN) injection 4 mg  4 mg IntraVENous Q6H PRN   ??? methadone (DOLOPHINE) 5 mg/5 mL oral solution 85 mg  85 mg Oral DAILY   ??? sodium chloride (NS) flush 5-40 mL  5-40 mL IntraVENous Q8H   ??? sodium chloride (NS) flush 5-40 mL  5-40 mL IntraVENous PRN   ??? acetaminophen (TYLENOL) tablet 650 mg  650 mg Oral Q6H  PRN    Or   ??? acetaminophen (TYLENOL) suppository 650 mg  650 mg Rectal Q6H PRN   ??? polyethylene glycol (MIRALAX) packet 17 g  17 g Oral DAILY PRN   ??? enoxaparin (LOVENOX) injection 40 mg  40 mg SubCUTAneous DAILY   ??? gabapentin (NEURONTIN) capsule 800 mg  800 mg Oral TID   ??? meloxicam (MOBIC) tablet 7.5 mg  7.5 mg Oral DAILY   ??? mirtazapine (REMERON) tablet 15 mg  15 mg Oral QHS     REVIEW OF SYSTEMS:        Const:   negative fever, negative chills, negative weight loss  Eyes:   negative diplopia or visual changes, negative eye pain  ENT:   negative coryza, negative sore throat  Resp:   negative cough, hemoptysis, dyspnea  Cards:   negative for chest pain, palpitations, lower extremity edema  GU:  negative for frequency, dysuria and hematuria  GI:   Negative for hematemesis and hematochezia  Skin:   negative for rash and pruritus  Heme:   negative for easy bruising and gum/nose bleeding  MS:  negative for myalgias, arthralgias, back pain and muscle weakness  Neurolo:  negative for headaches, dizziness, vertigo, memory problems   Psych:  negative for hallucinations        Objective:   VITALS:    Visit Vitals  BP 125/72 (BP 1 Location: Left arm, BP Patient Position: At rest)   Pulse 61   Temp 98.6 ??F (37 ??C)   Resp 16   Ht 5\' 3"  (1.6 m)   Wt 83.9 kg (185 lb)   SpO2 98%   BMI 32.77 kg/m??     Temp (24hrs), Avg:98.5 ??F (36.9 ??C), Min:98.3 ??F (36.8 ??C), Max:98.6 ??F (37 ??C)    PHYSICAL EXAM:   General:    Alert, cooperative, no distress, appears stated age.     Head:   Normocephalic, without obvious abnormality, atraumatic.  Eyes:   Conjunctivae clear, anicteric sclerae.    Nose:  Nares normal.   Throat:    Lips and tongue normal.  No Thrush  Neck:  Supple, symmetrical    no carotid bruit and no JVD.  GU:    No CVA tenderness, no foley catheter  Lungs:   Clear to auscultation  bilaterally.  No Wheezing or Rhonchi. No rales.  Heart:   Regular rate and rhythm, very faint murmur heard on the second ICS right parasternal  border.  Abdomen:   Soft, non-tender,not distended.  Bowel sounds normal.   Extremities: Knees, ankles, wrists, elbows are not warm and not tender.  No pedal edema  Skin:     No rashes or lesions.  Not Jaundiced  Lymph: Cervical normal  Neurologic: Full use of extraocular muscles, no facial asymmetry, tongue midline muscle strength 5 out of 5    LAB DATA REVIEWED:    Recent Results (from the past 48 hour(s))   PROTHROMBIN TIME + INR    Collection Time: 07/22/19  6:30 AM   Result Value Ref Range    INR 1.1 0.9 - 1.1      Prothrombin time 11.3 (H) 9.0 - 11.1 sec   PTT    Collection Time: 07/22/19  6:30 AM   Result Value Ref Range    aPTT 28.2 22.1 - 32.0 sec    aPTT, therapeutic range     58.0 - 77.0 SECS   FIBRINOGEN    Collection Time: 07/22/19  6:30 AM   Result Value Ref Range    Fibrinogen 276 200 - 475 mg/dL   D DIMER    Collection Time: 07/22/19  6:30 AM   Result Value Ref Range    D-dimer 1.54 (H) 0.00 - 0.65 mg/L FEU   METABOLIC PANEL, BASIC    Collection Time: 07/22/19  6:30 AM   Result Value Ref Range    Sodium 142 136 - 145 mmol/L    Potassium 3.2 (L) 3.5 - 5.1 mmol/L    Chloride 111 (H) 97 - 108 mmol/L    CO2 25 21 - 32 mmol/L    Anion gap 6 5 - 15 mmol/L    Glucose 80 65 - 100 mg/dL    BUN 8 6 - 20 MG/DL    Creatinine 1.01 0.55 - 1.02 MG/DL    BUN/Creatinine ratio 8 (L) 12 - 20      GFR est AA >60 >60 ml/min/1.32m2    GFR est non-AA 59 (L) >60 ml/min/1.44m2    Calcium 8.5 8.5 - 10.1 MG/DL   CBC WITH AUTOMATED DIFF    Collection Time: 07/22/19  6:30 AM   Result Value Ref Range    WBC 5.7 3.6 - 11.0 K/uL    RBC 3.73 (L) 3.80 - 5.20 M/uL    HGB 10.1 (L) 11.5 - 16.0 g/dL    HCT 32.2 (L) 35.0 - 47.0 %    MCV 86.3 80.0 - 99.0 FL    MCH 27.1 26.0 - 34.0 PG    MCHC 31.4 30.0 - 36.5 g/dL    RDW 15.4 (H) 11.5 - 14.5 %    PLATELET 204 150 - 400 K/uL    MPV 11.9 8.9 - 12.9 FL    NRBC 0.0 0 PER 100 WBC    ABSOLUTE NRBC 0.00 0.00 - 0.01 K/uL    NEUTROPHILS 52 32 - 75 %    LYMPHOCYTES 37 12 - 49 %    MONOCYTES 7 5 - 13  %    EOSINOPHILS 4 0 - 7 %    BASOPHILS 0 0 - 1 %    IMMATURE GRANULOCYTES 0 0.0 - 0.5 %    ABS. NEUTROPHILS 3.0 1.8 - 8.0 K/UL    ABS. LYMPHOCYTES 2.1 0.8 - 3.5 K/UL    ABS. MONOCYTES 0.4 0.0 -  1.0 K/UL    ABS. EOSINOPHILS 0.2 0.0 - 0.4 K/UL    ABS. BASOPHILS 0.0 0.0 - 0.1 K/UL    ABS. IMM. GRANS. 0.0 0.00 - 0.04 K/UL    DF AUTOMATED     METABOLIC PANEL, BASIC    Collection Time: 07/23/19  6:03 AM   Result Value Ref Range    Sodium 143 136 - 145 mmol/L    Potassium 4.0 3.5 - 5.1 mmol/L    Chloride 111 (H) 97 - 108 mmol/L    CO2 26 21 - 32 mmol/L    Anion gap 6 5 - 15 mmol/L    Glucose 97 65 - 100 mg/dL    BUN 11 6 - 20 MG/DL    Creatinine 0.97 0.55 - 1.02 MG/DL    BUN/Creatinine ratio 11 (L) 12 - 20      GFR est AA >60 >60 ml/min/1.20m2    GFR est non-AA >60 >60 ml/min/1.21m2    Calcium 8.8 8.5 - 10.1 MG/DL   CBC WITH AUTOMATED DIFF    Collection Time: 07/23/19  6:03 AM   Result Value Ref Range    WBC 4.8 3.6 - 11.0 K/uL    RBC 3.62 (L) 3.80 - 5.20 M/uL    HGB 9.9 (L) 11.5 - 16.0 g/dL    HCT 31.3 (L) 35.0 - 47.0 %    MCV 86.5 80.0 - 99.0 FL    MCH 27.3 26.0 - 34.0 PG    MCHC 31.6 30.0 - 36.5 g/dL    RDW 15.7 (H) 11.5 - 14.5 %    PLATELET 203 150 - 400 K/uL    MPV 11.2 8.9 - 12.9 FL    NRBC 0.0 0 PER 100 WBC    ABSOLUTE NRBC 0.00 0.00 - 0.01 K/uL    NEUTROPHILS 26 (L) 32 - 75 %    LYMPHOCYTES 55 (H) 12 - 49 %    MONOCYTES 11 5 - 13 %    EOSINOPHILS 8 (H) 0 - 7 %    BASOPHILS 0 0 - 1 %    IMMATURE GRANULOCYTES 0 0.0 - 0.5 %    ABS. NEUTROPHILS 1.3 (L) 1.8 - 8.0 K/UL    ABS. LYMPHOCYTES 2.6 0.8 - 3.5 K/UL    ABS. MONOCYTES 0.5 0.0 - 1.0 K/UL    ABS. EOSINOPHILS 0.4 0.0 - 0.4 K/UL    ABS. BASOPHILS 0.0 0.0 - 0.1 K/UL    ABS. IMM. GRANS. 0.0 0.00 - 0.04 K/UL    DF AUTOMATED     ECHO ADULT COMPLETE    Collection Time: 07/23/19  9:22 AM   Result Value Ref Range    IVSd 0.69 0.6 - 0.9 cm    LVIDd 4.72 3.9 - 5.3 cm    LVIDs 2.95 cm    LVOT d 1.88 cm    LVPWd 0.87 0.6 - 0.9 cm    LVOT Peak Gradient 5.36 mmHg    LVOT SV 62.8  mL    LVOT Peak Velocity 115.80 cm/s    LVOT VTI 22.66 cm    RVIDd 3.83 cm    RVSP 32.14 mmHg    RVSP 30.15 mmHg    RVSP 32.02 mmHg    RVSP 28.15 mmHg    RVSP 32.14 mmHg    Left Atrium Major Axis 3.31 cm    LA Volume 57.21 22 - 52 mL    LA Area 4C 19.09 cm2    LA Vol 2C 56.44 (A) 22 - 52 mL  LA Vol 4C 49.34 22 - 52 mL    Est. RA Pressure 3.00 mmHg    Aortic Valve Area by Continuity of Peak Velocity 1.99 cm2    Aortic Valve Area by Continuity of Peak Velocity 1.99 cm2    Aortic Valve Area by Continuity of VTI 1.91 cm2    Aortic Valve Area by Continuity of VTI 1.91 cm2    AoV PG 10.39 mmHg    Aortic Valve Systolic Mean Gradient [id number] mmHg    Aortic Valve Systolic Peak Velocity [id number] cm/s    AoV VTI 32.90 cm    MV A Vel 54.66 cm/s    Mitral Valve E Wave Deceleration Time 0.16 s    MV E Vel 96.30 cm/s    MV E/A 1.76     E/E' ratio (averaged) 7.31     E/E' lateral 5.38     E/E' septal 9.24     LV E' Lateral Velocity 17.91 cm/s    LV E' Septal Velocity 10.42 cm/s    Mitral Valve Pressure Half-time 0.05 s    MVA (PHT) 4.63 cm2    Pulmonic Valve Systolic Peak Instantaneous Gradient 5.16 mmHg    Pulmonic Regurgitant End Max Velocity 113.58 cm/s    Pulmonic Valve Systolic Peak Instantaneous Gradient 3.88 mmHg    Pulmonic Valve Max Velocity 98.50 cm/s    Tapse 2.27 (A) 1.5 - 2.0 cm    Triscuspid Valve Regurgitation Peak Gradient 29.14 mmHg    Triscuspid Valve Regurgitation Peak Gradient 27.15 mmHg    Triscuspid Valve Regurgitation Peak Gradient 29.02 mmHg    Triscuspid Valve Regurgitation Peak Gradient 25.15 mmHg    Triscuspid Valve Regurgitation Peak Gradient 29.14 mmHg    TR Max Velocity 269.90 cm/s    TR Max Velocity 260.54 cm/s    TR Max Velocity 269.33 cm/s    TR Max Velocity 250.74 cm/s    TR Max Velocity 269.90 cm/s    Ao Root D 2.71 cm    LV Mass AL 119.2 67 - 162 g    LV Mass AL Index 63.7 43 - 95 g/m2    Left Atrium Minor Axis 1.77 cm    LA Vol Index 30.58 16 - 28 ml/m2    LA Vol Index 30.17 16 - 28 ml/m2    LA Vol  Index 26.37 16 - 28 ml/m2         IMPRESSION    #1 fever    #2 polysubstance abuse    #3 chronic back pain    PLAN    Her blood cultures are negative.  This would normally not meet the diagnostic criteria for endocarditis.  Although culture-negative endocarditis is possible, I do not think she would have defervesced so quickly.  I propose discontinuing her Zosyn and observe her fever curve.  If she spikes fever again, I would recommend proceeding with TEE and getting an MRI of the lumbar spine. She tells me that her chronic back pain has been present for 16 years and has not worsened.                     ___________________________________________________  ID: Blair Hailey, MD

## 2019-07-23 NOTE — Consults (Signed)
[facility] [place]  [facility]  DIVISION of CARDIOLOGY CONSULT NOTE  Madolyn Frieze M.D., F.A.C.C.  [phone]        NAME: Anita Bell   DOB:  February 07, 1971   MRN:  [id number]  Date/Time:  10/1/20203:53 PM      ________________________________________________________________________  HPI    Very pleasant 50 is old lady with past medical history remarkable for polysubstance abuse, fibromyalgia, psychiatric disorder, who presented with an episode of fever chills which occur after the use of intravenous heroin followed by smoking crack cocaine.    She is on methadone clinic in general.    She denies any more recent history of fever malaise or similar.  She only had she tells me 24 hours worth of fever which is now abated she feels back to her herself with exception of some mild lower back pain which is chronic for her.    Cardiology call for evaluation of potential endocarditis.        Assessment/Plan  1.  Endocarditis: No evidence for endocarditis at this time.  The rest of count is normal.  She is completely afebrile at this time.  Her echocardiogram transthoracic failed to reveal any vegetation and some regurgitation of the mitral valve intra-cuspid valve were noted which I suspect responsible for the murmur that was heard.    Her blood cultures are thus far negative.    At this time I do not see any immediate need for transesophageal echocardiogram although this was explained to the patient has a possible recurrence in the near future.    I would wait for infectious disease evaluation prior to considering any further cardiac procedures and will remain available of course for TEE if deemed necessary.    Her electrocardiogram reveals normal sinus rhythm nonspecific T wave abnormalities and normal QT interval.    Her blood pressure is low normal.    She is not in any acute distress at the time of this examination.         ICD-10-CM ICD-9-CM    1. Substance abuse (Lydia)  F19.10 305.90    2. Fever,  unspecified fever cause  R50.9 780.60    3. Heart murmur  R01.1 785.2           CARDIAC STUDIES      07/21/19   ECHO ADULT COMPLETE 07/23/2019 07/23/2019    Narrative ?? LV: Estimated LVEF is 60 - 65%. Visually measured ejection fraction.   Normal cavity size, wall thickness and systolic function (ejection   fraction normal).  ?? TV: Mild tricuspid valve regurgitation is present.  ?? PV: Mild pulmonic valve regurgitation is present.  ?? PA: Pulmonary arterial systolic pressure is 30 mmHg.        Signed by: Telford Nab, MD                        EKG: normal sinus rhythm, nonspecific ST and T waves changes.        IMAGING      CT Results (most recent):  Results from Hospital Encounter encounter on 03/12/19   CT HEAD WO CONT    Narrative EXAM: CT HEAD WO CONT    INDICATION: hx of MS, worsening RLE weakness and slurred speech    COMPARISON: None.    CONTRAST: None.    TECHNIQUE: Unenhanced CT of the head was performed using 5 mm images. Brain and  bone windows were generated. Coronal and sagittal reformats. CT dose  reduction  was achieved through use of a standardized protocol tailored for this  examination and automatic exposure control for dose modulation.      FINDINGS:  The ventricles and sulci are normal in size, shape and configuration.. There is  no significant white matter disease. There is no intracranial hemorrhage,  extra-axial collection, or mass effect. The basilar cisterns are open. No CT  evidence of acute infarct.    The bone windows demonstrate no abnormalities. The visualized portions of the  paranasal sinuses and mastoid air cells are clear.      Impression IMPRESSION:   No evidence of acute abnormality             XR Results (most recent):  Results from Hospital Encounter encounter on 07/21/19   XR CHEST PORT    Narrative INDICATION: Fever, eval for pna    EXAM:  AP CHEST RADIOGRAPH    COMPARISON: June 11, 2012    FINDINGS:    AP portable view of the chest demonstrates a normal  cardiomediastinal  silhouette. There is no edema, effusion, consolidation, or pneumothorax. The  osseous structures are unremarkable.      Impression IMPRESSION:  No acute process.          MRI Results (most recent):  Results from [facility] encounter on 03/12/19   MRI LUMB SPINE WO CONT    Narrative EXAM: MRI LUMB SPINE WO CONT  Clinical history: Right leg weakness, back pain  INDICATION: Right leg weakness and back pain. Please include T11 and T12    COMPARISON: Lumbar spine x-ray performed 01/23/2016    TECHNIQUE: MR imaging of the lumbar spine was performed using the following  sequences: sagittal T1, T2, STIR;  axial T1, T2.     CONTRAST:  None.    FINDINGS:    Discogenic marrow degenerative marrow degenerative changes are greatest at  L4-L5. Loss of vertebral disc height at L4-L5. Acute fracture or dislocation.  There is no abnormality identified of T12 or T11. The superior margin of T11 is  not included on examination. No distal thoracic canal stenosis. Chronic severe  degenerative changes at L4-5.    The conus medullaris terminates at T12-L1. Signal and caliber of the distal  spinal cord are within normal limits.     The paraspinal soft tissues are within normal limits.    Lower thoracic spine: No herniation or stenosis.    L1-L2: No herniation or stenosis.    L2-L3: No herniation or stenosis.    L3-L4: No herniation or stenosis. Minimal facet arthropathy. The canal is  patent. Foramina are patent.    L4-L5: Facet arthropathy. Chronic loss of vertebral body height. Disc  bulge/osteophyte. Canal is patent. Foramina are patent. Type I Modic changes.    L5-S1: Minimal facet arthropathy. Canal and foramina are patent.           Impression IMPRESSION:  Chronic degenerative changes at L4-L5 without evidence of  canal or foraminal  compromise.   Discogenic marrow degenerative changes are most pronounced at L4-5.                   Subjective:   CHIEF COMPLAINT:  "fever"  Pertinent items are noted in  HPI.  HISTORY OF PRESENT ILLNESS:     Cyan is a 48 y.o. female whom presents with complaint noted above.   We were asked to evaluation for the above problems.   [place]  Past Medical History:   Diagnosis Date   ???  ADHD    ??? Asthma    ??? Depression    ??? Fibromyalgia    ??? Gastroenteritis    ??? Hydradenitis     suppurativa   ??? Osteoarthritis    ??? Other ill-defined conditions(799.89)     chronic back pain   ??? Psychiatric disorder       Past Surgical History:   Procedure Laterality Date   ??? HX GYN      tubes tided     Social History     Tobacco Use   ??? Smoking status: Never Smoker   ??? Smokeless tobacco: Never Used   Substance Use Topics   ??? Alcohol use: No      Family History   Problem Relation Age of Onset   ??? Alcohol abuse Neg Hx    ??? Arthritis-rheumatoid Neg Hx    ??? Asthma Neg Hx    ??? Bleeding Prob Neg Hx    ??? Cancer Neg Hx    ??? Diabetes Neg Hx    ??? Elevated Lipids Neg Hx    ??? Headache Neg Hx    ??? Heart Disease Neg Hx    ??? Hypertension Neg Hx    ??? Lung Disease Neg Hx    ??? Migraines Neg Hx    ??? Psychiatric Disorder Neg Hx    ??? Stroke Neg Hx    ??? Mental Retardation Neg Hx       Allergies   Allergen Reactions   ??? Aspirin Nausea and Vomiting   ??? Aspirin Hives   ??? Ibuprofen Other (comments)     Upsets her stomach   ??? Ibuprofen Other (comments)     Abdominal pain             Current Facility-Administered Medications   Medication Dose Route Frequency   ??? influenza vaccine 2020-21 (6 mos+)(PF) (FLUARIX/FLULAVAL/FLUZONE QUAD) injection 0.5 mL  0.5 mL IntraMUSCular PRIOR TO DISCHARGE   ??? diphenhydrAMINE (BENADRYL) capsule 25 mg  25 mg Oral Q6H PRN   ??? ondansetron (ZOFRAN) injection 4 mg  4 mg IntraVENous Q6H PRN   ??? methadone (DOLOPHINE) 5 mg/5 mL oral solution 85 mg  85 mg Oral DAILY   ??? sodium chloride (NS) flush 5-40 mL  5-40 mL IntraVENous Q8H   ??? sodium chloride (NS) flush 5-40 mL  5-40 mL IntraVENous PRN   ??? acetaminophen (TYLENOL) tablet 650 mg  650 mg Oral Q6H PRN    Or   ??? acetaminophen (TYLENOL) suppository 650 mg  650  mg Rectal Q6H PRN   ??? polyethylene glycol (MIRALAX) packet 17 g  17 g Oral DAILY PRN   ??? enoxaparin (LOVENOX) injection 40 mg  40 mg SubCUTAneous DAILY   ??? piperacillin-tazobactam (ZOSYN) 3.375 g in 0.9% sodium chloride (MBP/ADV) 100 mL  3.375 g IntraVENous Q8H   ??? gabapentin (NEURONTIN) capsule 800 mg  800 mg Oral TID   ??? meloxicam (MOBIC) tablet 7.5 mg  7.5 mg Oral DAILY   ??? mirtazapine (REMERON) tablet 15 mg  15 mg Oral QHS            Prior to Admission medications    Medication Sig Start Date End Date Taking? Authorizing Provider   methadone (DOLOPHINE) 10 mg/mL solution Take 85 mg by mouth daily. Indications: symptoms from stopping treatment with opioid drugs 07/09/19  Yes Amlalo, Celestina, NP   meloxicam (MOBIC) 7.5 mg tablet Indications: received today 03/24/19  Yes Provider, Historical   rizatriptan (MAXALT-MLT) 10 mg disintegrating tablet  03/24/19  Yes Provider, Historical   topiramate (TOPAMAX) 50 mg tablet  03/24/19  Yes Provider, Historical   buprenorphine-naloxone (SUBOXONE) 8-2 mg film sublingaul film 1 Film by SubLINGual route three (3) times daily.   Yes Provider, Historical   DULoxetine (CYMBALTA) 60 mg capsule Take 60 mg by mouth daily.   Yes Provider, Historical   famotidine (PEPCID) 20 mg tablet Take 20 mg by mouth two (2) times a day.   Yes Provider, Historical   gabapentin (NEURONTIN) 800 mg tablet Take 800 mg by mouth three (3) times daily.   Yes Provider, Historical   mirtazapine (REMERON SOL-TAB) 15 mg disintegrating tablet Take 15 mg by mouth nightly.   Yes Provider, Historical   naproxen (NAPROSYN) 375 mg tablet Take 375 mg by mouth two (2) times daily (with meals).   Yes Provider, Historical   risperiDONE (RisperDAL) 1 mg tablet Take 1 mg by mouth daily.   Yes Provider, Historical                Objective:   VITALS:      Patient Vitals for the past 12 hrs:   Temp Pulse Resp BP SpO2   07/23/19 1440 98.6 ??F (37 ??C) 61 16 125/72 98 %   07/23/19 0901 ??? ??? ??? (!) 99/59 ???   07/23/19 0823 98.4 ??F (36.9  ??C) (!) 56 18 128/86 100 %        Visit Vitals  BP 125/72 (BP 1 Location: Left arm, BP Patient Position: At rest)   Pulse 61   Temp 98.6 ??F (37 ??C)   Resp 16   Ht 5\' 3"  (1.6 m)   Wt 185 lb (83.9 kg)   SpO2 98%   BMI 32.77 kg/m??         Extended / Orthostatic Vitals:        Wt Readings from Last 3 Encounters:   07/23/19 185 lb (83.9 kg)   03/15/19 160 lb 11.2 oz (72.9 kg)   03/14/19 159 lb 2.8 oz (72.2 kg)       No intake or output data in the 24 hours ending 07/23/19 1553       Neck: no JVD  Heart: regular rate and rhythm, systolic murmur: systolic ejection 2/6, crescendo at 2nd right intercostal space  Lungs: clear to auscultation bilaterally  Abdomen: soft, non-tender. Bowel sounds normal. No masses,  no organomegaly  Extremities: no edema          LAB DATA REVIEWED:      All Cardiac Markers in the last 24 hours:  No results found for: CPK, CK, CKMMB, CKMB, RCK3, CKMBT, CKMBPOC, CKNDX, CKND1, MYO, TROPT, TROIQ, TROI, TROPT, TNIPOC, BNP, BNPP, BNPNT      Recent Results (from the past 24 hour(s))   METABOLIC PANEL, BASIC    Collection Time: 07/23/19  6:03 AM   Result Value Ref Range    Sodium 143 136 - 145 mmol/L    Potassium 4.0 3.5 - 5.1 mmol/L    Chloride 111 (H) 97 - 108 mmol/L    CO2 26 21 - 32 mmol/L    Anion gap 6 5 - 15 mmol/L    Glucose 97 65 - 100 mg/dL    BUN 11 6 - 20 MG/DL    Creatinine 0.97 0.55 - 1.02 MG/DL    BUN/Creatinine ratio 11 (L) 12 - 20      GFR est AA >60 >60 ml/min/1.40m2    GFR est non-AA >60 >60 ml/min/1.19m2  Calcium 8.8 8.5 - 10.1 MG/DL   CBC WITH AUTOMATED DIFF    Collection Time: 07/23/19  6:03 AM   Result Value Ref Range    WBC 4.8 3.6 - 11.0 K/uL    RBC 3.62 (L) 3.80 - 5.20 M/uL    HGB 9.9 (L) 11.5 - 16.0 g/dL    HCT 31.3 (L) 35.0 - 47.0 %    MCV 86.5 80.0 - 99.0 FL    MCH 27.3 26.0 - 34.0 PG    MCHC 31.6 30.0 - 36.5 g/dL    RDW 15.7 (H) 11.5 - 14.5 %    PLATELET 203 150 - 400 K/uL    MPV 11.2 8.9 - 12.9 FL    NRBC 0.0 0 PER 100 WBC    ABSOLUTE NRBC 0.00 0.00 - 0.01 K/uL     NEUTROPHILS 26 (L) 32 - 75 %    LYMPHOCYTES 55 (H) 12 - 49 %    MONOCYTES 11 5 - 13 %    EOSINOPHILS 8 (H) 0 - 7 %    BASOPHILS 0 0 - 1 %    IMMATURE GRANULOCYTES 0 0.0 - 0.5 %    ABS. NEUTROPHILS 1.3 (L) 1.8 - 8.0 K/UL    ABS. LYMPHOCYTES 2.6 0.8 - 3.5 K/UL    ABS. MONOCYTES 0.5 0.0 - 1.0 K/UL    ABS. EOSINOPHILS 0.4 0.0 - 0.4 K/UL    ABS. BASOPHILS 0.0 0.0 - 0.1 K/UL    ABS. IMM. GRANS. 0.0 0.00 - 0.04 K/UL    DF AUTOMATED     ECHO ADULT COMPLETE    Collection Time: 07/23/19  9:22 AM   Result Value Ref Range    IVSd 0.69 0.6 - 0.9 cm    LVIDd 4.72 3.9 - 5.3 cm    LVIDs 2.95 cm    LVOT d 1.88 cm    LVPWd 0.87 0.6 - 0.9 cm    LVOT Peak Gradient 5.36 mmHg    LVOT SV 62.8 mL    LVOT Peak Velocity 115.80 cm/s    LVOT VTI 22.66 cm    RVIDd 3.83 cm    RVSP 32.14 mmHg    RVSP 30.15 mmHg    RVSP 32.02 mmHg    RVSP 28.15 mmHg    RVSP 32.14 mmHg    Left Atrium Major Axis 3.31 cm    LA Volume 57.21 22 - 52 mL    LA Area 4C 19.09 cm2    LA Vol 2C 56.44 (A) 22 - 52 mL    LA Vol 4C 49.34 22 - 52 mL    Est. RA Pressure 3.00 mmHg    Aortic Valve Area by Continuity of Peak Velocity 1.99 cm2    Aortic Valve Area by Continuity of Peak Velocity 1.99 cm2    Aortic Valve Area by Continuity of VTI 1.91 cm2    Aortic Valve Area by Continuity of VTI 1.91 cm2    AoV PG 10.39 mmHg    Aortic Valve Systolic Mean Gradient [id number] mmHg    Aortic Valve Systolic Peak Velocity [id number] cm/s    AoV VTI 32.90 cm    MV A Vel 54.66 cm/s    Mitral Valve E Wave Deceleration Time 0.16 s    MV E Vel 96.30 cm/s    MV E/A 1.76     E/E' ratio (averaged) 7.31     E/E' lateral 5.38     E/E' septal 9.24     LV E' Lateral Velocity 17.Ravalli  cm/s    LV E' Septal Velocity 10.42 cm/s    Mitral Valve Pressure Half-time 0.05 s    MVA (PHT) 4.63 cm2    Pulmonic Valve Systolic Peak Instantaneous Gradient 5.16 mmHg    Pulmonic Regurgitant End Max Velocity 113.58 cm/s    Pulmonic Valve Systolic Peak Instantaneous Gradient 3.88 mmHg    Pulmonic Valve Max Velocity 98.50 cm/s     Tapse 2.27 (A) 1.5 - 2.0 cm    Triscuspid Valve Regurgitation Peak Gradient 29.14 mmHg    Triscuspid Valve Regurgitation Peak Gradient 27.15 mmHg    Triscuspid Valve Regurgitation Peak Gradient 29.02 mmHg    Triscuspid Valve Regurgitation Peak Gradient 25.15 mmHg    Triscuspid Valve Regurgitation Peak Gradient 29.14 mmHg    TR Max Velocity 269.90 cm/s    TR Max Velocity 260.54 cm/s    TR Max Velocity 269.33 cm/s    TR Max Velocity 250.74 cm/s    TR Max Velocity 269.90 cm/s    Ao Root D 2.71 cm    LV Mass AL 119.2 67 - 162 g    LV Mass AL Index 63.7 43 - 95 g/m2    Left Atrium Minor Axis 1.77 cm    LA Vol Index 30.58 16 - 28 ml/m2    LA Vol Index 30.17 16 - 28 ml/m2    LA Vol Index 26.37 16 - 28 ml/m2       No results found for: CHOL, CHOLPOCT, CHOLX, CHLST, CHOLV, HDL, HDLPOC, HDLP, LDL, LDLCPOC, LDLC, DLDLP, VLDLC, VLDL, TGLX, TRIGL, TRIGP, TGLPOCT, CHHD, CHHDX          Procedures: see electronic medical records for all procedures/Xrays and details which were not copied into this note but were reviewed prior to creation of Plan.        Please note that this dictation was completed with Dragon, the computer voice recognition software.  Quite often unanticipated grammatical, syntax, homophones, and other interpretive errors are inadvertently transcribed by the computer software.  Please disregard these errors.  Please excuse any errors that have escaped final proofreading.      ________________________________________________________________________  Total time spent with patient: 30 minutes     Critical Care Provided     Minutes non procedure based        Care Plan discussed with:  Patient    Family    RN    Care Manager    Consultant/Specialist:     ________________________    ______________________________________________      Signed By: Madolyn Frieze, MD     July 23, 2019

## 2019-07-23 NOTE — Progress Notes (Signed)
Problem: Risk for Spread of Infection  Goal: Prevent transmission of infectious organism to others  Description: Prevent the transmission of infectious organisms to other patients, staff members, and visitors.  Outcome: Progressing Towards Goal     Problem: Patient Education:  Go to Education Activity  Goal: Patient/Family Education  Outcome: Progressing Towards Goal     Problem: Falls - Risk of  Goal: *Absence of Falls  Description: Document [name] Fall Risk and appropriate interventions in the flowsheet.  Outcome: Progressing Towards Goal  Note: Fall Risk Interventions:            Medication Interventions: Patient to call before getting OOB, Teach patient to arise slowly    Elimination Interventions: Call light in reach              Problem: Patient Education: Go to Patient Education Activity  Goal: Patient/Family Education  Outcome: Progressing Towards Goal     Problem: Pressure Injury - Risk of  Goal: *Prevention of pressure injury  Description: Document Braden Scale and appropriate interventions in the flowsheet.  Outcome: Progressing Towards Goal  Note: Pressure Injury Interventions:            Activity Interventions: Increase time out of bed    Mobility Interventions: HOB 30 degrees or less    Nutrition Interventions: Document food/fluid/supplement intake                     Problem: Patient Education: Go to Patient Education Activity  Goal: Patient/Family Education  Outcome: Progressing Towards Goal     Problem: Pain  Goal: *Control of Pain  Outcome: Progressing Towards Goal  Goal: *PALLIATIVE CARE:  Alleviation of Pain  Outcome: Progressing Towards Goal     Problem: Patient Education: Go to Patient Education Activity  Goal: Patient/Family Education  Outcome: Progressing Towards Goal     Problem: General Medical Care Plan  Goal: *Vital signs within specified parameters  Outcome: Progressing Towards Goal  Goal: *Labs within defined limits  Outcome: Progressing Towards Goal  Goal: *Absence of infection signs  and symptoms  Outcome: Progressing Towards Goal  Goal: *Optimal pain control at patient's stated goal  Outcome: Progressing Towards Goal  Goal: *Skin integrity maintained  Outcome: Progressing Towards Goal  Goal: *Fluid volume balance  Outcome: Progressing Towards Goal  Goal: *Optimize nutritional status  Outcome: Progressing Towards Goal  Goal: *Anxiety reduced or absent  Outcome: Progressing Towards Goal  Goal: *Progressive mobility and function (eg: ADL's)  Outcome: Progressing Towards Goal     Problem: Patient Education: Go to Patient Education Activity  Goal: Patient/Family Education  Outcome: Progressing Towards Goal

## 2019-07-23 NOTE — Progress Notes (Signed)
Physical Therapy Screening:  Services are not indicated at this time.    An InBasket screening referral was triggered for physical therapy based on results obtained during the nursing admission assessment.  The patient's chart was reviewed and the patient is not appropriate for a skilled therapy evaluation at this time.  Please consult physical therapy if any therapy needs arise.  Thank you.    Maggie Thornhill, PT

## 2019-07-23 NOTE — Progress Notes (Signed)
Bedside shift change report given to Dois Davenport (Cabin crew) by Casimer Bilis (offgoing nurse). Report included the following information SBAR, Kardex, MAR and Recent Results.

## 2019-07-23 NOTE — Progress Notes (Signed)
Progress  Notes by Sammuel Hines, MD at 07/23/19 1415                Author: Sammuel Hines, MD  Service: Hospitalist  Author Type: Physician       Filed: 07/23/19 1425  Date of Service: 07/23/19 1415  Status: Signed          Editor: Sammuel Hines, MD (Physician)                                                Hospitalist Progress Note         Hospital summary: Anita Bell is a 48 y.o. female past medical history significant for polysubstance abuse, psychiatric disorders presented emergency room complaining on a fever.  She stated  that today she started having fevers and chills at home.  Unknown how high it was temperature at home.  She reports using cocaine and heroin around 7 PM and afterward she started feeling feverish.  She denies any symptoms such as headache, cough, shortness  of breath, chest pain, sore throat, runny nose, nausea, vomiting, diarrhea, urinary symptom.  On arrival to the emergency room patient was found to be febrile with a temperature of 100.9 lethargic but arousable.  Work-up overall unremarkable.  Given  her risk factor admission was requested.  Patient awake and alert at this time 07/21/2019         Assessment/Plan:   Fever (POA):??unknown etiology.??   - concern for bacteremia/endocarditis given hx IV drug use and new murmur vs viral infection.    -COVID 19 test negative   -Blood culture NGTD   - normal wbc, lactic. Elevated  pro cal 19.55    - CXR: no acute process    - c/w IV zosyn. previouslyDCed IV vanco    - ID consulted      ??   ??Systolic Murmur: new finding as per pt report.    - Echocardiogram ordered   Cardiology consulted   May need TEE   ??   Polysubstance abuse:   - UDS: positive for cocaine, methadone, opiates and THC   - Monitor for signs of withdrawal. Supportive care      Hx Chronic back pain    - on methadone and gabapentin       Hypokalemia - replaced       Code status: Full   DVT prophylaxis: Lovenox    Disposition: TBD. Home likely when ready    ----------------------------------------------      CC: Fever      S: Patient seen and examined at bedside this morning . She feels better. No fevers, no acute issues overnight       Review of Systems:   A comprehensive review of systems was negative.      O:   Visit Vitals      BP  (!) 99/59        Pulse  (!) 56     Temp  98.4 ??F (36.9 ??C)     Resp  18     Ht  5\' 3"  (1.6 m)     Wt  83.9 kg (185 lb)     SpO2  100%        BMI  32.77 kg/m??           PHYSICAL EXAM:   Gen: NAD   HEENT:  anicteric sclerae, normal conjunctiva, oropharynx clear, MM moist   Neck: supple, trachea midline, no adenopathy   Heart: RRR   Lungs: CTA b/l, non-labored respirations   Abd: soft, NT, ND, BS+, no organomegaly   Extr: warm   Skin: dry, no rash   Neuro:  normal speech, moves all extremities         No intake or output data in the 24 hours ending 07/23/19 1415       Recent labs & imaging reviewed:     Recent Results (from the past 24 hour(s))     METABOLIC PANEL, BASIC          Collection Time: 07/23/19  6:03 AM         Result  Value  Ref Range            Sodium  143  136 - 145 mmol/L       Potassium  4.0  3.5 - 5.1 mmol/L       Chloride  111 (H)  97 - 108 mmol/L       CO2  26  21 - 32 mmol/L       Anion gap  6  5 - 15 mmol/L       Glucose  97  65 - 100 mg/dL       BUN  11  6 - 20 MG/DL       Creatinine  0.97  0.55 - 1.02 MG/DL       BUN/Creatinine ratio  11 (L)  12 - 20              GFR est AA  >60  >60 ml/min/1.16m2            GFR est non-AA  >60  >60 ml/min/1.32m2       Calcium  8.8  8.5 - 10.1 MG/DL       CBC WITH AUTOMATED DIFF          Collection Time: 07/23/19  6:03 AM         Result  Value  Ref Range            WBC  4.8  3.6 - 11.0 K/uL       RBC  3.62 (L)  3.80 - 5.20 M/uL       HGB  9.9 (L)  11.5 - 16.0 g/dL       HCT  31.3 (L)  35.0 - 47.0 %       MCV  86.5  80.0 - 99.0 FL       MCH  27.3  26.0 - 34.0 PG       MCHC  31.6  30.0 - 36.5 g/dL       RDW  15.7 (H)  11.5 - 14.5 %       PLATELET  203  150 - 400 K/uL       MPV  11.2  8.9  - 12.9 FL       NRBC  0.0  0 PER 100 WBC       ABSOLUTE NRBC  0.00  0.00 - 0.01 K/uL       NEUTROPHILS  26 (L)  32 - 75 %       LYMPHOCYTES  55 (H)  12 - 49 %       MONOCYTES  11  5 - 13 %       EOSINOPHILS  8 (H)  0 - 7 %  BASOPHILS  0  0 - 1 %       IMMATURE GRANULOCYTES  0  0.0 - 0.5 %       ABS. NEUTROPHILS  1.3 (L)  1.8 - 8.0 K/UL       ABS. LYMPHOCYTES  2.6  0.8 - 3.5 K/UL       ABS. MONOCYTES  0.5  0.0 - 1.0 K/UL       ABS. EOSINOPHILS  0.4  0.0 - 0.4 K/UL       ABS. BASOPHILS  0.0  0.0 - 0.1 K/UL       ABS. IMM. GRANS.  0.0  0.00 - 0.04 K/UL       DF  AUTOMATED          ECHO ADULT COMPLETE          Collection Time: 07/23/19  9:22 AM         Result  Value  Ref Range            IVSd  0.69  0.6 - 0.9 cm       LVIDd  4.72  3.9 - 5.3 cm       LVIDs  2.95  cm       LVOT d  1.88  cm       LVPWd  0.87  0.6 - 0.9 cm       LVOT Peak Gradient  5.36  mmHg       LVOT SV  62.8  mL       LVOT Peak Velocity  115.80  cm/s       LVOT VTI  22.66  cm       RVIDd  3.83  cm       RVSP  32.14  mmHg       RVSP  30.15  mmHg       RVSP  32.02  mmHg       RVSP  28.15  mmHg       RVSP  32.14  mmHg       Left Atrium Major Axis  3.31  cm       LA Volume  57.21  22 - 52 mL       LA Area 4C  19.09  cm2       LA Vol 2C  56.44 (A)  22 - 52 mL       LA Vol 4C  49.34  22 - 52 mL       Est. RA Pressure  3.00  mmHg       Aortic Valve Area by Continuity of Peak Velocity  1.99  cm2       Aortic Valve Area by Continuity of Peak Velocity  1.99  cm2       Aortic Valve Area by Continuity of VTI  1.91  cm2       Aortic Valve Area by Continuity of VTI  1.91  cm2       AoV PG  10.39  mmHg       Aortic Valve Systolic Mean Gradient  [id number]  mmHg       Aortic Valve Systolic Peak Velocity  [id number]  cm/s       AoV VTI  32.90  cm       MV A Vel  54.66  cm/s       Mitral Valve E Wave Deceleration Time  0.16  s       MV E  Vel  96.30  cm/s       MV E/A  1.76         E/E' ratio (averaged)  7.31         E/E' lateral  5.38         E/E' septal  9.24         LV E'  Lateral Velocity  17.91  cm/s       LV E' Septal Velocity  10.42  cm/s       Mitral Valve Pressure Half-time  0.05  s       MVA (PHT)  4.63  cm2       Pulmonic Valve Systolic Peak Instantaneous Gradient  5.16  mmHg       Pulmonic Regurgitant End Max Velocity  113.58  cm/s       Pulmonic Valve Systolic Peak Instantaneous Gradient  3.88  mmHg       Pulmonic Valve Max Velocity  98.50  cm/s       Tapse  2.27 (A)  1.5 - 2.0 cm       Triscuspid Valve Regurgitation Peak Gradient  29.14  mmHg       Triscuspid Valve Regurgitation Peak Gradient  27.15  mmHg       Triscuspid Valve Regurgitation Peak Gradient  29.02  mmHg       Triscuspid Valve Regurgitation Peak Gradient  25.15  mmHg       Triscuspid Valve Regurgitation Peak Gradient  29.14  mmHg       TR Max Velocity  269.90  cm/s       TR Max Velocity  260.54  cm/s       TR Max Velocity  269.33  cm/s       TR Max Velocity  250.74  cm/s       TR Max Velocity  269.90  cm/s       Ao Root D  2.71  cm       LV Mass AL  119.2  67 - 162 g       LV Mass AL Index  63.7  43 - 95 g/m2       Left Atrium Minor Axis  1.77  cm       LA Vol Index  30.58  16 - 28 ml/m2       LA Vol Index  30.17  16 - 28 ml/m2            LA Vol Index  26.37  16 - 28 ml/m2          Recent Labs            07/23/19   0603  07/22/19   0630     WBC  4.8  5.7     HGB  9.9*  10.1*     HCT  31.3*  32.2*         PLT  203  204          Recent Labs             07/23/19   0603  07/22/19   0630  07/21/19   0127     NA  143  142  138     K  4.0  3.2*  3.2*     CL  111*  111*  105     CO2  26  25  25      BUN  11  8  9     CREA  0.97  1.01  1.06*     GLU  97  80  114*          CA  8.8  8.5  9.1          Recent Labs           07/21/19   0127     ALT  25     AP  78     TBILI  0.6     TP  8.0     ALB  3.6        GLOB  4.4*          Recent Labs            07/22/19   0630  07/21/19   0619     INR  1.1  1.1     PTP  11.3*  11.5*         APTT  28.2  26.8           Recent Labs           07/21/19   0619        FERR  44           Lab  Results         Component  Value  Date/Time            Folate  5.4  03/14/2019 05:47 AM         No results for input(s): PH, PCO2, PO2 in the last 72 hours.     Recent Labs           07/21/19   0127     CPK  125        TROIQ  <0.05        No results found for: CHOL, CHOLX, CHLST, CHOLV, HDL, HDLP, LDL, LDLC, DLDLP, TGLX, TRIGL, TRIGP, CHHD, CHHDX     Lab Results         Component  Value  Date/Time            Glucose (POC)  144 (H)  03/14/2019 09:56 AM          Lab Results         Component  Value  Date/Time            Color  YELLOW/STRAW  07/21/2019 03:44 AM       Appearance  CLEAR  07/21/2019 03:44 AM       Specific gravity  1.008  07/21/2019 03:44 AM       Specific gravity  1.029  09/14/2009 08:45 PM       pH (UA)  7.0  07/21/2019 03:44 AM       Protein  Negative  07/21/2019 03:44 AM       Glucose  Negative  07/21/2019 03:44 AM       Ketone  Negative  07/21/2019 03:44 AM       Bilirubin  Negative  07/21/2019 03:44 AM       Urobilinogen  1.0  07/21/2019 03:44 AM       Nitrites  Negative  07/21/2019 03:44 AM       Leukocyte Esterase  TRACE (A)  07/21/2019 03:44 AM       Epithelial cells  FEW  07/21/2019 03:44 AM       Bacteria  Negative  07/21/2019 03:44 AM       WBC  0-4  07/21/2019  03:44 AM            RBC  0-5  07/21/2019 03:44 AM           Med list reviewed     Current Facility-Administered Medications          Medication  Dose  Route  Frequency           ?  influenza vaccine 2020-21 (6 mos+)(PF) (FLUARIX/FLULAVAL/FLUZONE QUAD) injection 0.5 mL   0.5 mL  IntraMUSCular  PRIOR TO DISCHARGE     ?  diphenhydrAMINE (BENADRYL) capsule 25 mg   25 mg  Oral  Q6H PRN     ?  ondansetron (ZOFRAN) injection 4 mg   4 mg  IntraVENous  Q6H PRN     ?  methadone (DOLOPHINE) 5 mg/5 mL oral solution 85 mg   85 mg  Oral  DAILY     ?  sodium chloride (NS) flush 5-40 mL   5-40 mL  IntraVENous  Q8H     ?  sodium chloride (NS) flush 5-40 mL   5-40 mL  IntraVENous  PRN     ?  acetaminophen (TYLENOL) tablet 650 mg   650 mg  Oral  Q6H PRN           Or           ?  acetaminophen (TYLENOL) suppository 650 mg   650 mg  Rectal  Q6H PRN     ?  polyethylene glycol (MIRALAX) packet 17 g   17 g  Oral  DAILY PRN     ?  enoxaparin (LOVENOX) injection 40 mg   40 mg  SubCUTAneous  DAILY     ?  piperacillin-tazobactam (ZOSYN) 3.375 g in 0.9% sodium chloride (MBP/ADV) 100 mL   3.375 g  IntraVENous  Q8H     ?  gabapentin (NEURONTIN) capsule 800 mg   800 mg  Oral  TID     ?  meloxicam (MOBIC) tablet 7.5 mg   7.5 mg  Oral  DAILY           ?  mirtazapine (REMERON) tablet 15 mg   15 mg  Oral  QHS           Care Plan discussed with:   Patient/Family and Nurse      Sammuel Hines, MD   Internal Medicine   Date of Service: 07/23/2019

## 2019-07-23 NOTE — Consults (Signed)
[facility]  DIVISION of CARDIOLOGY CONSULT NOTE  Anita Bell M.D., F.A.C.C.  [phone]        NAME: Anita Bell   DOB:  06-17-1971   MRN:  [id number]  Date/Time:  10/1/20203:53 PM      ________________________________________________________________________  HPI    Very pleasant 48 is old lady with past medical history remarkable for polysubstance abuse, fibromyalgia, psychiatric disorder, who presented with an episode of fever chills which occur after the use of intravenous heroin followed by smoking crack cocaine.    She is on methadone clinic in general.    She denies any more recent history of fever malaise or similar.  She only had she tells me 24 hours worth of fever which is now abated she feels back to her herself with exception of some mild lower back pain which is chronic for her.    Cardiology call for evaluation of potential endocarditis.        Assessment/Plan  1.  Endocarditis: No evidence for endocarditis at this time.  The rest of count is normal.  She is completely afebrile at this time.  Her echocardiogram transthoracic failed to reveal any vegetation and some regurgitation of the mitral valve intra-cuspid valve were noted which I suspect responsible for the murmur that was heard.    Her blood cultures are thus far negative.    At this time I do not see any immediate need for transesophageal echocardiogram although this was explained to the patient has a possible recurrence in the near future.    I would wait for infectious disease evaluation prior to considering any further cardiac procedures and will remain available of course for TEE if deemed necessary.    Her electrocardiogram reveals normal sinus rhythm nonspecific T wave abnormalities and normal QT interval.    Her blood pressure is low normal.    She is not in any acute distress at the time of this examination.         ICD-10-CM ICD-9-CM    1. Substance abuse ([place])  F19.10 305.90     2. Fever, unspecified fever cause  R50.9 780.60    3. Heart murmur  R01.1 785.2           CARDIAC STUDIES      07/21/19   ECHO ADULT COMPLETE 07/23/2019 07/23/2019    Narrative ?? LV: Estimated LVEF is 60 - 65%. Visually measured ejection fraction.   Normal cavity size, wall thickness and systolic function (ejection   fraction normal).  ?? TV: Mild tricuspid valve regurgitation is present.  ?? PV: Mild pulmonic valve regurgitation is present.  ?? PA: Pulmonary arterial systolic pressure is 30 mmHg.        Signed by: Telford Nab, MD                        EKG: normal sinus rhythm, nonspecific ST and T waves changes.        IMAGING      CT Results (most recent):  Results from Hospital Encounter encounter on 03/12/19   CT HEAD WO CONT    Narrative EXAM: CT HEAD WO CONT    INDICATION: hx of MS, worsening RLE weakness and slurred speech    COMPARISON: None.    CONTRAST: None.    TECHNIQUE: Unenhanced CT of the head was performed using 5 mm images. Brain and  bone windows were generated. Coronal and sagittal reformats. CT dose  reduction  was achieved through use of a standardized protocol tailored for this  examination and automatic exposure control for dose modulation.      FINDINGS:  The ventricles and sulci are normal in size, shape and configuration.. There is  no significant white matter disease. There is no intracranial hemorrhage,  extra-axial collection, or mass effect. The basilar cisterns are open. No CT  evidence of acute infarct.    The bone windows demonstrate no abnormalities. The visualized portions of the  paranasal sinuses and mastoid air cells are clear.      Impression IMPRESSION:   No evidence of acute abnormality             XR Results (most recent):  Results from Hospital Encounter encounter on 07/21/19   XR CHEST PORT    Narrative INDICATION: Fever, eval for pna    EXAM:  AP CHEST RADIOGRAPH    COMPARISON: June 11, 2012    FINDINGS:     AP portable view of the chest demonstrates a normal cardiomediastinal  silhouette. There is no edema, effusion, consolidation, or pneumothorax. The  osseous structures are unremarkable.      Impression IMPRESSION:  No acute process.          MRI Results (most recent):  Results from [place] encounter on 03/12/19   MRI LUMB SPINE WO CONT    Narrative EXAM: MRI LUMB SPINE WO CONT  Clinical history: Right leg weakness, back pain  INDICATION: Right leg weakness and back pain. Please include T11 and T12    COMPARISON: Lumbar spine x-ray performed 01/23/2016    TECHNIQUE: MR imaging of the lumbar spine was performed using the following  sequences: sagittal T1, T2, STIR;  axial T1, T2.     CONTRAST:  None.    FINDINGS:    Discogenic marrow degenerative marrow degenerative changes are greatest at  L4-L5. Loss of vertebral disc height at L4-L5. Acute fracture or dislocation.  There is no abnormality identified of T12 or T11. The superior margin of T11 is  not included on examination. No distal thoracic canal stenosis. Chronic severe  degenerative changes at L4-5.    The conus medullaris terminates at T12-L1. Signal and caliber of the distal  spinal cord are within normal limits.     The paraspinal soft tissues are within normal limits.    Lower thoracic spine: No herniation or stenosis.    L1-L2: No herniation or stenosis.    L2-L3: No herniation or stenosis.    L3-L4: No herniation or stenosis. Minimal facet arthropathy. The canal is  patent. Foramina are patent.    L4-L5: Facet arthropathy. Chronic loss of vertebral body height. Disc  bulge/osteophyte. Canal is patent. Foramina are patent. Type I Modic changes.    L5-S1: Minimal facet arthropathy. Canal and foramina are patent.           Impression IMPRESSION:  Chronic degenerative changes at L4-L5 without evidence of  canal or foraminal  compromise.   Discogenic marrow degenerative changes are most pronounced at L4-5.                   Subjective:    CHIEF COMPLAINT:  "fever"  Pertinent items are noted in HPI.  HISTORY OF PRESENT ILLNESS:     Anita Bell is a 48 y.o. female whom presents with complaint noted above.   We were asked to evaluation for the above problems.   [place]  Past Medical History:   Diagnosis Date   ???  ADHD    ??? Asthma    ??? Depression    ??? Fibromyalgia    ??? Gastroenteritis    ??? Hydradenitis     suppurativa   ??? Osteoarthritis    ??? Other ill-defined conditions(799.89)     chronic back pain   ??? Psychiatric disorder       Past Surgical History:   Procedure Laterality Date   ??? HX GYN      tubes tided     Social History     Tobacco Use   ??? Smoking status: Never Smoker   ??? Smokeless tobacco: Never Used   Substance Use Topics   ??? Alcohol use: No      Family History   Problem Relation Age of Onset   ??? Alcohol abuse Neg Hx    ??? Arthritis-rheumatoid Neg Hx    ??? Asthma Neg Hx    ??? Bleeding Prob Neg Hx    ??? Cancer Neg Hx    ??? Diabetes Neg Hx    ??? Elevated Lipids Neg Hx    ??? Headache Neg Hx    ??? Heart Disease Neg Hx    ??? Hypertension Neg Hx    ??? Lung Disease Neg Hx    ??? Migraines Neg Hx    ??? Psychiatric Disorder Neg Hx    ??? Stroke Neg Hx    ??? Mental Retardation Neg Hx       Allergies   Allergen Reactions   ??? Aspirin Nausea and Vomiting   ??? Aspirin Hives   ??? Ibuprofen Other (comments)     Upsets her stomach   ??? Ibuprofen Other (comments)     Abdominal pain             Current Facility-Administered Medications   Medication Dose Route Frequency   ??? influenza vaccine 2020-21 (6 mos+)(PF) (FLUARIX/FLULAVAL/FLUZONE QUAD) injection 0.5 mL  0.5 mL IntraMUSCular PRIOR TO DISCHARGE   ??? diphenhydrAMINE (BENADRYL) capsule 25 mg  25 mg Oral Q6H PRN   ??? ondansetron (ZOFRAN) injection 4 mg  4 mg IntraVENous Q6H PRN   ??? methadone (DOLOPHINE) 5 mg/5 mL oral solution 85 mg  85 mg Oral DAILY   ??? sodium chloride (NS) flush 5-40 mL  5-40 mL IntraVENous Q8H   ??? sodium chloride (NS) flush 5-40 mL  5-40 mL IntraVENous PRN   ??? acetaminophen (TYLENOL) tablet 650 mg  650 mg Oral Q6H PRN     Or   ??? acetaminophen (TYLENOL) suppository 650 mg  650 mg Rectal Q6H PRN   ??? polyethylene glycol (MIRALAX) packet 17 g  17 g Oral DAILY PRN   ??? enoxaparin (LOVENOX) injection 40 mg  40 mg SubCUTAneous DAILY   ??? piperacillin-tazobactam (ZOSYN) 3.375 g in 0.9% sodium chloride (MBP/ADV) 100 mL  3.375 g IntraVENous Q8H   ??? gabapentin (NEURONTIN) capsule 800 mg  800 mg Oral TID   ??? meloxicam (MOBIC) tablet 7.5 mg  7.5 mg Oral DAILY   ??? mirtazapine (REMERON) tablet 15 mg  15 mg Oral QHS            Prior to Admission medications    Medication Sig Start Date End Date Taking? Authorizing Provider   methadone (DOLOPHINE) 10 mg/mL solution Take 85 mg by mouth daily. Indications: symptoms from stopping treatment with opioid drugs 07/09/19  Yes Amlalo, Celestina, NP   meloxicam (MOBIC) 7.5 mg tablet Indications: received today 03/24/19  Yes Provider, Historical   rizatriptan (MAXALT-MLT) 10 mg disintegrating tablet  03/24/19  Yes Provider, Historical   topiramate (TOPAMAX) 50 mg tablet  03/24/19  Yes Provider, Historical   buprenorphine-naloxone (SUBOXONE) 8-2 mg film sublingaul film 1 Film by SubLINGual route three (3) times daily.   Yes Provider, Historical   DULoxetine (CYMBALTA) 60 mg capsule Take 60 mg by mouth daily.   Yes Provider, Historical   famotidine (PEPCID) 20 mg tablet Take 20 mg by mouth two (2) times a day.   Yes Provider, Historical   gabapentin (NEURONTIN) 800 mg tablet Take 800 mg by mouth three (3) times daily.   Yes Provider, Historical   mirtazapine (REMERON SOL-TAB) 15 mg disintegrating tablet Take 15 mg by mouth nightly.   Yes Provider, Historical   naproxen (NAPROSYN) 375 mg tablet Take 375 mg by mouth two (2) times daily (with meals).   Yes Provider, Historical   risperiDONE (RisperDAL) 1 mg tablet Take 1 mg by mouth daily.   Yes Provider, Historical                Objective:   VITALS:      Patient Vitals for the past 12 hrs:   Temp Pulse Resp BP SpO2   07/23/19 1440 98.6 ??F (37 ??C) 61 16 125/72 98 %    07/23/19 0901 ??? ??? ??? (!) 99/59 ???   07/23/19 0823 98.4 ??F (36.9 ??C) (!) 56 18 128/86 100 %        Visit Vitals  BP 125/72 (BP 1 Location: Left arm, BP Patient Position: At rest)   Pulse 61   Temp 98.6 ??F (37 ??C)   Resp 16   Ht 5\' 3"  (1.6 m)   Wt 185 lb (83.9 kg)   SpO2 98%   BMI 32.77 kg/m??         Extended / Orthostatic Vitals:        Wt Readings from Last 3 Encounters:   07/23/19 185 lb (83.9 kg)   03/15/19 160 lb 11.2 oz (72.9 kg)   03/14/19 159 lb 2.8 oz (72.2 kg)       No intake or output data in the 24 hours ending 07/23/19 1553       Neck: no JVD  Heart: regular rate and rhythm, systolic murmur: systolic ejection 2/6, crescendo at 2nd right intercostal space  Lungs: clear to auscultation bilaterally  Abdomen: soft, non-tender. Bowel sounds normal. No masses,  no organomegaly  Extremities: no edema          LAB DATA REVIEWED:      All Cardiac Markers in the last 24 hours:  No results found for: CPK, CK, CKMMB, CKMB, RCK3, CKMBT, CKMBPOC, CKNDX, CKND1, MYO, TROPT, TROIQ, TROI, TROPT, TNIPOC, BNP, BNPP, BNPNT      Recent Results (from the past 24 hour(s))   METABOLIC PANEL, BASIC    Collection Time: 07/23/19  6:03 AM   Result Value Ref Range    Sodium 143 136 - 145 mmol/L    Potassium 4.0 3.5 - 5.1 mmol/L    Chloride 111 (H) 97 - 108 mmol/L    CO2 26 21 - 32 mmol/L    Anion gap 6 5 - 15 mmol/L    Glucose 97 65 - 100 mg/dL    BUN 11 6 - 20 MG/DL    Creatinine 0.97 0.55 - 1.02 MG/DL    BUN/Creatinine ratio 11 (L) 12 - 20      GFR est AA >60 >60 ml/min/1.75m2    GFR est non-AA >60 >60 ml/min/1.11m2  Calcium 8.8 8.5 - 10.1 MG/DL   CBC WITH AUTOMATED DIFF    Collection Time: 07/23/19  6:03 AM   Result Value Ref Range    WBC 4.8 3.6 - 11.0 K/uL    RBC 3.62 (L) 3.80 - 5.20 M/uL    HGB 9.9 (L) 11.5 - 16.0 g/dL    HCT 31.3 (L) 35.0 - 47.0 %    MCV 86.5 80.0 - 99.0 FL    MCH 27.3 26.0 - 34.0 PG    MCHC 31.6 30.0 - 36.5 g/dL    RDW 15.7 (H) 11.5 - 14.5 %    PLATELET 203 150 - 400 K/uL    MPV 11.2 8.9 - 12.9 FL     NRBC 0.0 0 PER 100 WBC    ABSOLUTE NRBC 0.00 0.00 - 0.01 K/uL    NEUTROPHILS 26 (L) 32 - 75 %    LYMPHOCYTES 55 (H) 12 - 49 %    MONOCYTES 11 5 - 13 %    EOSINOPHILS 8 (H) 0 - 7 %    BASOPHILS 0 0 - 1 %    IMMATURE GRANULOCYTES 0 0.0 - 0.5 %    ABS. NEUTROPHILS 1.3 (L) 1.8 - 8.0 K/UL    ABS. LYMPHOCYTES 2.6 0.8 - 3.5 K/UL    ABS. MONOCYTES 0.5 0.0 - 1.0 K/UL    ABS. EOSINOPHILS 0.4 0.0 - 0.4 K/UL    ABS. BASOPHILS 0.0 0.0 - 0.1 K/UL    ABS. IMM. GRANS. 0.0 0.00 - 0.04 K/UL    DF AUTOMATED     ECHO ADULT COMPLETE    Collection Time: 07/23/19  9:22 AM   Result Value Ref Range    IVSd 0.69 0.6 - 0.9 cm    LVIDd 4.72 3.9 - 5.3 cm    LVIDs 2.95 cm    LVOT d 1.88 cm    LVPWd 0.87 0.6 - 0.9 cm    LVOT Peak Gradient 5.36 mmHg    LVOT SV 62.8 mL    LVOT Peak Velocity 115.80 cm/s    LVOT VTI 22.66 cm    RVIDd 3.83 cm    RVSP 32.14 mmHg    RVSP 30.15 mmHg    RVSP 32.02 mmHg    RVSP 28.15 mmHg    RVSP 32.14 mmHg    Left Atrium Major Axis 3.31 cm    LA Volume 57.21 22 - 52 mL    LA Area 4C 19.09 cm2    LA Vol 2C 56.44 (A) 22 - 52 mL    LA Vol 4C 49.34 22 - 52 mL    Est. RA Pressure 3.00 mmHg    Aortic Valve Area by Continuity of Peak Velocity 1.99 cm2    Aortic Valve Area by Continuity of Peak Velocity 1.99 cm2    Aortic Valve Area by Continuity of VTI 1.91 cm2    Aortic Valve Area by Continuity of VTI 1.91 cm2    AoV PG 10.39 mmHg    Aortic Valve Systolic Mean Gradient [id number] mmHg    Aortic Valve Systolic Peak Velocity [id number] cm/s    AoV VTI 32.90 cm    MV A Vel 54.66 cm/s    Mitral Valve E Wave Deceleration Time 0.16 s    MV E Vel 96.30 cm/s    MV E/A 1.76     E/E' ratio (averaged) 7.31     E/E' lateral 5.38     E/E' septal 9.24     LV E' Lateral Velocity 17.[place]  cm/s    LV E' Septal Velocity 10.42 cm/s    Mitral Valve Pressure Half-time 0.05 s    MVA (PHT) 4.63 cm2    Pulmonic Valve Systolic Peak Instantaneous Gradient 5.16 mmHg    Pulmonic Regurgitant End Max Velocity 113.58 cm/s     Pulmonic Valve Systolic Peak Instantaneous Gradient 3.88 mmHg    Pulmonic Valve Max Velocity 98.50 cm/s    Tapse 2.27 (A) 1.5 - 2.0 cm    Triscuspid Valve Regurgitation Peak Gradient 29.14 mmHg    Triscuspid Valve Regurgitation Peak Gradient 27.15 mmHg    Triscuspid Valve Regurgitation Peak Gradient 29.02 mmHg    Triscuspid Valve Regurgitation Peak Gradient 25.15 mmHg    Triscuspid Valve Regurgitation Peak Gradient 29.14 mmHg    TR Max Velocity 269.90 cm/s    TR Max Velocity 260.54 cm/s    TR Max Velocity 269.33 cm/s    TR Max Velocity 250.74 cm/s    TR Max Velocity 269.90 cm/s    Ao Root D 2.71 cm    LV Mass AL 119.2 67 - 162 g    LV Mass AL Index 63.7 43 - 95 g/m2    Left Atrium Minor Axis 1.77 cm    LA Vol Index 30.58 16 - 28 ml/m2    LA Vol Index 30.17 16 - 28 ml/m2    LA Vol Index 26.37 16 - 28 ml/m2       No results found for: CHOL, CHOLPOCT, CHOLX, CHLST, CHOLV, HDL, HDLPOC, HDLP, LDL, LDLCPOC, LDLC, DLDLP, VLDLC, VLDL, TGLX, TRIGL, TRIGP, TGLPOCT, CHHD, CHHDX          Procedures: see electronic medical records for all procedures/Xrays and details which were not copied into this note but were reviewed prior to creation of Plan.        Please note that this dictation was completed with Dragon, the computer voice recognition software.  Quite often unanticipated grammatical, syntax, homophones, and other interpretive errors are inadvertently transcribed by the computer software.  Please disregard these errors.  Please excuse any errors that have escaped final proofreading.      ________________________________________________________________________  Total time spent with patient: 30 minutes     Critical Care Provided     Minutes non procedure based        Care Plan discussed with:  Patient    Family    RN    Care Manager    Consultant/Specialist:     ________________________    ______________________________________________      Signed By: Anita Frieze, MD     July 23, 2019

## 2019-07-23 NOTE — Progress Notes (Signed)
Problem: Risk for Spread of Infection  Goal: Prevent transmission of infectious organism to others  Description: Prevent the transmission of infectious organisms to other patients, staff members, and visitors.  Outcome: Progressing Towards Goal     Problem: Patient Education:  Go to Education Activity  Goal: Patient/Family Education  Outcome: Progressing Towards Goal     Problem: Falls - Risk of  Goal: *Absence of Falls  Description: Document [name] Fall Risk and appropriate interventions in the flowsheet.  Outcome: Progressing Towards Goal  Note: Fall Risk Interventions:            Medication Interventions: Patient to call before getting OOB, Teach patient to arise slowly    Elimination Interventions: Call light in reach              Problem: Patient Education: Go to Patient Education Activity  Goal: Patient/Family Education  Outcome: Progressing Towards Goal     Problem: Pressure Injury - Risk of  Goal: *Prevention of pressure injury  Description: Document Braden Scale and appropriate interventions in the flowsheet.  Outcome: Progressing Towards Goal  Note: Pressure Injury Interventions:            Activity Interventions: Increase time out of bed    Mobility Interventions: HOB 30 degrees or less    Nutrition Interventions: Document food/fluid/supplement intake                     Problem: Patient Education: Go to Patient Education Activity  Goal: Patient/Family Education  Outcome: Progressing Towards Goal     Problem: Pain  Goal: *Control of Pain  Outcome: Progressing Towards Goal  Goal: *PALLIATIVE CARE:  Alleviation of Pain  Outcome: Progressing Towards Goal     Problem: Patient Education: Go to Patient Education Activity  Goal: Patient/Family Education  Outcome: Progressing Towards Goal     Problem: General Medical Care Plan  Goal: *Vital signs within specified parameters  Outcome: Progressing Towards Goal  Goal: *Labs within defined limits  Outcome: Progressing Towards Goal   Goal: *Absence of infection signs and symptoms  Outcome: Progressing Towards Goal  Goal: *Optimal pain control at patient's stated goal  Outcome: Progressing Towards Goal  Goal: *Skin integrity maintained  Outcome: Progressing Towards Goal  Goal: *Fluid volume balance  Outcome: Progressing Towards Goal  Goal: *Optimize nutritional status  Outcome: Progressing Towards Goal  Goal: *Anxiety reduced or absent  Outcome: Progressing Towards Goal  Goal: *Progressive mobility and function (eg: ADL's)  Outcome: Progressing Towards Goal     Problem: Patient Education: Go to Patient Education Activity  Goal: Patient/Family Education  Outcome: Progressing Towards Goal

## 2019-07-23 NOTE — Progress Notes (Signed)
Bedside shift change report given to Katharine Look (Soil scientist) by Roanna Epley (offgoing nurse). Report included the following information SBAR, Kardex, MAR and Recent Results.

## 2019-07-23 NOTE — Consults (Signed)
ID Consult Note  NAME:  Anita Bell   DOB:   [birth year]   MRN:   [id number]   Date/Time:  07/23/2019 6:55 PM  Subjective:   REASON FOR CONSULT: Fever     Anita Bell is a 48 y.o. with a history of polysubstance abuse.  She also has a history of bipolar disorder well as chronic back pain.  She has had back pain since 2004.  She tells me that her pain right now is stable and has not worsened for months now.  She was admitted on September 29 because she had fever after injecting herself with heroin.  She also had cocaine that day.  She developed some chills.  She does not have any dyspnea, cough, abdominal pain, dysuria, diarrhea or other joint pains.  She does not have any rash.  Because of her symptoms she came here to [facility].  She was admitted under the hospitalist service.  Her temperature was 100.9.  White blood cell count was normal at 9.  She had blood cultures taken and these are so far negative.  He has been afebrile for 2 days now.  She does not have any complaints.      Past Medical History:   Diagnosis Date   ??? ADHD    ??? Asthma    ??? Depression    ??? Fibromyalgia    ??? Gastroenteritis    ??? Hydradenitis     suppurativa   ??? Osteoarthritis    ??? Other ill-defined conditions(799.89)     chronic back pain   ??? Psychiatric disorder       Past Surgical History:   Procedure Laterality Date   ??? HX GYN      tubes tided   Tubal ligation    Family history  She says that her mother has arthritis.  Her father has psychiatric disorder.    Social history  She does not smoke.  She drinks alcohol occasionally.  She uses recreational drugs specifically cocaine and heroin.      Allergies   Allergen Reactions   ??? Aspirin Nausea and Vomiting   ??? Aspirin Hives   ??? Ibuprofen Other (comments)     Upsets her stomach   ??? Ibuprofen Other (comments)     Abdominal pain        Home Medications:  Prior to Admission Medications   Prescriptions Last Dose Informant Patient Reported? Taking?    DULoxetine (CYMBALTA) 60 mg capsule 07/16/2019 at Unknown time  Yes Yes   Sig: Take 60 mg by mouth daily.   buprenorphine-naloxone (SUBOXONE) 8-2 mg film sublingaul film 06/23/2019 at Unknown time  Yes Yes   Sig: 1 Film by SubLINGual route three (3) times daily.   famotidine (PEPCID) 20 mg tablet 07/16/2019 at Unknown time  Yes Yes   Sig: Take 20 mg by mouth two (2) times a day.   gabapentin (NEURONTIN) 800 mg tablet 07/16/2019 at Unknown time  Yes Yes   Sig: Take 800 mg by mouth three (3) times daily.   meloxicam (MOBIC) 7.5 mg tablet 07/16/2019 at Unknown time  Yes Yes   Sig: Indications: received today   methadone (DOLOPHINE) 10 mg/mL solution 07/16/2019 at Unknown time  Yes Yes   Sig: Take 85 mg by mouth daily. Indications: symptoms from stopping treatment with opioid drugs   mirtazapine (REMERON SOL-TAB) 15 mg disintegrating tablet 07/16/2019 at Unknown time  Yes Yes   Sig: Take 15 mg by mouth nightly.   naproxen (NAPROSYN)  375 mg tablet 07/16/2019 at Unknown time  Yes Yes   Sig: Take 375 mg by mouth two (2) times daily (with meals).   risperiDONE (RisperDAL) 1 mg tablet 07/16/2019 at Unknown time  Yes Yes   Sig: Take 1 mg by mouth daily.   rizatriptan (MAXALT-MLT) 10 mg disintegrating tablet 07/16/2019 at Unknown time  Yes Yes   topiramate (TOPAMAX) 50 mg tablet 07/16/2019 at Unknown time  Yes Yes      Facility-Administered Medications: None     Hospital medications:  Current Facility-Administered Medications   Medication Dose Route Frequency   ??? influenza vaccine 2020-21 (6 mos+)(PF) (FLUARIX/FLULAVAL/FLUZONE QUAD) injection 0.5 mL  0.5 mL IntraMUSCular PRIOR TO DISCHARGE   ??? diphenhydrAMINE (BENADRYL) capsule 25 mg  25 mg Oral Q6H PRN   ??? ondansetron (ZOFRAN) injection 4 mg  4 mg IntraVENous Q6H PRN   ??? methadone (DOLOPHINE) 5 mg/5 mL oral solution 85 mg  85 mg Oral DAILY   ??? sodium chloride (NS) flush 5-40 mL  5-40 mL IntraVENous Q8H   ??? sodium chloride (NS) flush 5-40 mL  5-40 mL IntraVENous PRN    ??? acetaminophen (TYLENOL) tablet 650 mg  650 mg Oral Q6H PRN    Or   ??? acetaminophen (TYLENOL) suppository 650 mg  650 mg Rectal Q6H PRN   ??? polyethylene glycol (MIRALAX) packet 17 g  17 g Oral DAILY PRN   ??? enoxaparin (LOVENOX) injection 40 mg  40 mg SubCUTAneous DAILY   ??? gabapentin (NEURONTIN) capsule 800 mg  800 mg Oral TID   ??? meloxicam (MOBIC) tablet 7.5 mg  7.5 mg Oral DAILY   ??? mirtazapine (REMERON) tablet 15 mg  15 mg Oral QHS     REVIEW OF SYSTEMS:        Const:   negative fever, negative chills, negative weight loss  Eyes:   negative diplopia or visual changes, negative eye pain  ENT:   negative coryza, negative sore throat  Resp:   negative cough, hemoptysis, dyspnea  Cards:   negative for chest pain, palpitations, lower extremity edema  GU:  negative for frequency, dysuria and hematuria  GI:   Negative for hematemesis and hematochezia  Skin:   negative for rash and pruritus  Heme:   negative for easy bruising and gum/nose bleeding  MS:  negative for myalgias, arthralgias, back pain and muscle weakness  Neurolo:  negative for headaches, dizziness, vertigo, memory problems   Psych:  negative for hallucinations        Objective:   VITALS:    Visit Vitals  BP 125/72 (BP 1 Location: Left arm, BP Patient Position: At rest)   Pulse 61   Temp 98.6 ??F (37 ??C)   Resp 16   Ht 5\' 3"  (1.6 m)   Wt 83.9 kg (185 lb)   SpO2 98%   BMI 32.77 kg/m??     Temp (24hrs), Avg:98.5 ??F (36.9 ??C), Min:98.3 ??F (36.8 ??C), Max:98.6 ??F (37 ??C)    PHYSICAL EXAM:   General:    Alert, cooperative, no distress, appears stated age.     Head:   Normocephalic, without obvious abnormality, atraumatic.  Eyes:   Conjunctivae clear, anicteric sclerae.    Nose:  Nares normal.   Throat:    Lips and tongue normal.  No Thrush  Neck:  Supple, symmetrical    no carotid bruit and no JVD.  GU:    No CVA tenderness, no foley catheter  Lungs:   Clear to auscultation  bilaterally.  No Wheezing or Rhonchi. No rales.   Heart:   Regular rate and rhythm, very faint murmur heard on the second ICS right parasternal border.  Abdomen:   Soft, non-tender,not distended.  Bowel sounds normal.   Extremities: Knees, ankles, wrists, elbows are not warm and not tender.  No pedal edema  Skin:     No rashes or lesions.  Not Jaundiced  Lymph: Cervical normal  Neurologic: Full use of extraocular muscles, no facial asymmetry, tongue midline muscle strength 5 out of 5    LAB DATA REVIEWED:    Recent Results (from the past 48 hour(s))   PROTHROMBIN TIME + INR    Collection Time: 07/22/19  6:30 AM   Result Value Ref Range    INR 1.1 0.9 - 1.1      Prothrombin time 11.3 (H) 9.0 - 11.1 sec   PTT    Collection Time: 07/22/19  6:30 AM   Result Value Ref Range    aPTT 28.2 22.1 - 32.0 sec    aPTT, therapeutic range     58.0 - 77.0 SECS   FIBRINOGEN    Collection Time: 07/22/19  6:30 AM   Result Value Ref Range    Fibrinogen 276 200 - 475 mg/dL   D DIMER    Collection Time: 07/22/19  6:30 AM   Result Value Ref Range    D-dimer 1.54 (H) 0.00 - 0.65 mg/L FEU   METABOLIC PANEL, BASIC    Collection Time: 07/22/19  6:30 AM   Result Value Ref Range    Sodium 142 136 - 145 mmol/L    Potassium 3.2 (L) 3.5 - 5.1 mmol/L    Chloride 111 (H) 97 - 108 mmol/L    CO2 25 21 - 32 mmol/L    Anion gap 6 5 - 15 mmol/L    Glucose 80 65 - 100 mg/dL    BUN 8 6 - 20 MG/DL    Creatinine 1.01 0.55 - 1.02 MG/DL    BUN/Creatinine ratio 8 (L) 12 - 20      GFR est AA >60 >60 ml/min/1.95m2    GFR est non-AA 59 (L) >60 ml/min/1.76m2    Calcium 8.5 8.5 - 10.1 MG/DL   CBC WITH AUTOMATED DIFF    Collection Time: 07/22/19  6:30 AM   Result Value Ref Range    WBC 5.7 3.6 - 11.0 K/uL    RBC 3.73 (L) 3.80 - 5.20 M/uL    HGB 10.1 (L) 11.5 - 16.0 g/dL    HCT 32.2 (L) 35.0 - 47.0 %    MCV 86.3 80.0 - 99.0 FL    MCH 27.1 26.0 - 34.0 PG    MCHC 31.4 30.0 - 36.5 g/dL    RDW 15.4 (H) 11.5 - 14.5 %    PLATELET 204 150 - 400 K/uL    MPV 11.9 8.9 - 12.9 FL    NRBC 0.0 0 PER 100 WBC     ABSOLUTE NRBC 0.00 0.00 - 0.01 K/uL    NEUTROPHILS 52 32 - 75 %    LYMPHOCYTES 37 12 - 49 %    MONOCYTES 7 5 - 13 %    EOSINOPHILS 4 0 - 7 %    BASOPHILS 0 0 - 1 %    IMMATURE GRANULOCYTES 0 0.0 - 0.5 %    ABS. NEUTROPHILS 3.0 1.8 - 8.0 K/UL    ABS. LYMPHOCYTES 2.1 0.8 - 3.5 K/UL    ABS. MONOCYTES 0.4 0.0 -  1.0 K/UL    ABS. EOSINOPHILS 0.2 0.0 - 0.4 K/UL    ABS. BASOPHILS 0.0 0.0 - 0.1 K/UL    ABS. IMM. GRANS. 0.0 0.00 - 0.04 K/UL    DF AUTOMATED     METABOLIC PANEL, BASIC    Collection Time: 07/23/19  6:03 AM   Result Value Ref Range    Sodium 143 136 - 145 mmol/L    Potassium 4.0 3.5 - 5.1 mmol/L    Chloride 111 (H) 97 - 108 mmol/L    CO2 26 21 - 32 mmol/L    Anion gap 6 5 - 15 mmol/L    Glucose 97 65 - 100 mg/dL    BUN 11 6 - 20 MG/DL    Creatinine 0.97 0.55 - 1.02 MG/DL    BUN/Creatinine ratio 11 (L) 12 - 20      GFR est AA >60 >60 ml/min/1.75m2    GFR est non-AA >60 >60 ml/min/1.68m2    Calcium 8.8 8.5 - 10.1 MG/DL   CBC WITH AUTOMATED DIFF    Collection Time: 07/23/19  6:03 AM   Result Value Ref Range    WBC 4.8 3.6 - 11.0 K/uL    RBC 3.62 (L) 3.80 - 5.20 M/uL    HGB 9.9 (L) 11.5 - 16.0 g/dL    HCT 31.3 (L) 35.0 - 47.0 %    MCV 86.5 80.0 - 99.0 FL    MCH 27.3 26.0 - 34.0 PG    MCHC 31.6 30.0 - 36.5 g/dL    RDW 15.7 (H) 11.5 - 14.5 %    PLATELET 203 150 - 400 K/uL    MPV 11.2 8.9 - 12.9 FL    NRBC 0.0 0 PER 100 WBC    ABSOLUTE NRBC 0.00 0.00 - 0.01 K/uL    NEUTROPHILS 26 (L) 32 - 75 %    LYMPHOCYTES 55 (H) 12 - 49 %    MONOCYTES 11 5 - 13 %    EOSINOPHILS 8 (H) 0 - 7 %    BASOPHILS 0 0 - 1 %    IMMATURE GRANULOCYTES 0 0.0 - 0.5 %    ABS. NEUTROPHILS 1.3 (L) 1.8 - 8.0 K/UL    ABS. LYMPHOCYTES 2.6 0.8 - 3.5 K/UL    ABS. MONOCYTES 0.5 0.0 - 1.0 K/UL    ABS. EOSINOPHILS 0.4 0.0 - 0.4 K/UL    ABS. BASOPHILS 0.0 0.0 - 0.1 K/UL    ABS. IMM. GRANS. 0.0 0.00 - 0.04 K/UL    DF AUTOMATED     ECHO ADULT COMPLETE    Collection Time: 07/23/19  9:22 AM   Result Value Ref Range    IVSd 0.69 0.6 - 0.9 cm     LVIDd 4.72 3.9 - 5.3 cm    LVIDs 2.95 cm    LVOT d 1.88 cm    LVPWd 0.87 0.6 - 0.9 cm    LVOT Peak Gradient 5.36 mmHg    LVOT SV 62.8 mL    LVOT Peak Velocity 115.80 cm/s    LVOT VTI 22.66 cm    RVIDd 3.83 cm    RVSP 32.14 mmHg    RVSP 30.15 mmHg    RVSP 32.02 mmHg    RVSP 28.15 mmHg    RVSP 32.14 mmHg    Left Atrium Major Axis 3.31 cm    LA Volume 57.21 22 - 52 mL    LA Area 4C 19.09 cm2    LA Vol 2C 56.44 (A) 22 - 52 mL  LA Vol 4C 49.34 22 - 52 mL    Est. RA Pressure 3.00 mmHg    Aortic Valve Area by Continuity of Peak Velocity 1.99 cm2    Aortic Valve Area by Continuity of Peak Velocity 1.99 cm2    Aortic Valve Area by Continuity of VTI 1.91 cm2    Aortic Valve Area by Continuity of VTI 1.91 cm2    AoV PG 10.39 mmHg    Aortic Valve Systolic Mean Gradient Q000111Q mmHg    Aortic Valve Systolic Peak Velocity [id number] cm/s    AoV VTI 32.90 cm    MV A Vel 54.66 cm/s    Mitral Valve E Wave Deceleration Time 0.16 s    MV E Vel 96.30 cm/s    MV E/A 1.76     E/E' ratio (averaged) 7.31     E/E' lateral 5.38     E/E' septal 9.24     LV E' Lateral Velocity 17.91 cm/s    LV E' Septal Velocity 10.42 cm/s    Mitral Valve Pressure Half-time 0.05 s    MVA (PHT) 4.63 cm2    Pulmonic Valve Systolic Peak Instantaneous Gradient 5.16 mmHg    Pulmonic Regurgitant End Max Velocity 113.58 cm/s    Pulmonic Valve Systolic Peak Instantaneous Gradient 3.88 mmHg    Pulmonic Valve Max Velocity 98.50 cm/s    Tapse 2.27 (A) 1.5 - 2.0 cm    Triscuspid Valve Regurgitation Peak Gradient 29.14 mmHg    Triscuspid Valve Regurgitation Peak Gradient 27.15 mmHg    Triscuspid Valve Regurgitation Peak Gradient 29.02 mmHg    Triscuspid Valve Regurgitation Peak Gradient 25.15 mmHg    Triscuspid Valve Regurgitation Peak Gradient 29.14 mmHg    TR Max Velocity 269.90 cm/s    TR Max Velocity 260.54 cm/s    TR Max Velocity 269.33 cm/s    TR Max Velocity 250.74 cm/s    TR Max Velocity 269.90 cm/s    Ao Root D 2.71 cm    LV Mass AL 119.2 67 - 162 g     LV Mass AL Index 63.7 43 - 95 g/m2    Left Atrium Minor Axis 1.77 cm    LA Vol Index 30.58 16 - 28 ml/m2    LA Vol Index 30.17 16 - 28 ml/m2    LA Vol Index 26.37 16 - 28 ml/m2         IMPRESSION    #1 fever    #2 polysubstance abuse    #3 chronic back pain    PLAN    Her blood cultures are negative.  This would normally not meet the diagnostic criteria for endocarditis.  Although culture-negative endocarditis is possible, I do not think she would have defervesced so quickly.  I propose discontinuing her Zosyn and observe her fever curve.  If she spikes fever again, I would recommend proceeding with TEE and getting an MRI of the lumbar spine. She tells me that her chronic back pain has been present for 16 years and has not worsened.                     ___________________________________________________  ID: Blair Hailey, MD

## 2019-07-23 NOTE — Progress Notes (Signed)
Hospitalist Progress Note      Hospital summary: Anita Bell is a 48 y.o. female past medical history significant for polysubstance abuse, psychiatric disorders presented emergency room complaining on a fever.  She stated that today she started having fevers and chills at home.  Unknown how high it was temperature at home.  She reports using cocaine and heroin around 7 PM and afterward she started feeling feverish.  She denies any symptoms such as headache, cough, shortness of breath, chest pain, sore throat, runny nose, nausea, vomiting, diarrhea, urinary symptom.  On arrival to the emergency room patient was found to be febrile with a temperature of 100.9 lethargic but arousable.  Work-up overall unremarkable.  Given her risk factor admission was requested.  Patient awake and alert at this time 07/21/2019      Assessment/Plan:  Fever (POA):??unknown etiology.??  - concern for bacteremia/endocarditis given hx IV drug use and new murmur vs viral infection.   ???COVID 19 test negative  ???Blood culture NGTD  - normal wbc, lactic. Elevated  pro cal 19.55   - CXR: no acute process   ??? c/w IV zosyn. previouslyDCed IV vanco   - ID consulted    ??  ??Systolic Murmur: new finding as per pt report.   - Echocardiogram ordered  Cardiology consulted  May need TEE  ??  Polysubstance abuse:  - UDS: positive for cocaine, methadone, opiates and THC  - Monitor for signs of withdrawal. Supportive care    Hx Chronic back pain   - on methadone and gabapentin     Hypokalemia - replaced     Code status: Full  DVT prophylaxis: Lovenox   Disposition: TBD. Home likely when ready  ----------------------------------------------    CC: Fever    S: Patient seen and examined at bedside this morning . She feels better. No fevers, no acute issues overnight     Review of Systems:  A comprehensive review of systems was negative.    O:  Visit Vitals  BP (!) 99/59   Pulse (!) 56   Temp 98.4 ??F (36.9 ??C)   Resp 18    Ht 5\' 3"  (1.6 m)   Wt 83.9 kg (185 lb)   SpO2 100%   BMI 32.77 kg/m??       PHYSICAL EXAM:  Gen: NAD  HEENT: anicteric sclerae, normal conjunctiva, oropharynx clear, MM moist  Neck: supple, trachea midline, no adenopathy  Heart: RRR  Lungs: CTA b/l, non-labored respirations  Abd: soft, NT, ND, BS+, no organomegaly  Extr: warm  Skin: dry, no rash  Neuro:  normal speech, moves all extremities      No intake or output data in the 24 hours ending 07/23/19 1415     Recent labs & imaging reviewed:  Recent Results (from the past 24 hour(s))   METABOLIC PANEL, BASIC    Collection Time: 07/23/19  6:03 AM   Result Value Ref Range    Sodium 143 136 - 145 mmol/L    Potassium 4.0 3.5 - 5.1 mmol/L    Chloride 111 (H) 97 - 108 mmol/L    CO2 26 21 - 32 mmol/L    Anion gap 6 5 - 15 mmol/L    Glucose 97 65 - 100 mg/dL    BUN 11 6 - 20 MG/DL    Creatinine 0.97 0.55 - 1.02 MG/DL    BUN/Creatinine ratio 11 (L) 12 - 20      GFR est AA >60 >60 ml/min/1.65m2  GFR est non-AA >60 >60 ml/min/1.48m2    Calcium 8.8 8.5 - 10.1 MG/DL   CBC WITH AUTOMATED DIFF    Collection Time: 07/23/19  6:03 AM   Result Value Ref Range    WBC 4.8 3.6 - 11.0 K/uL    RBC 3.62 (L) 3.80 - 5.20 M/uL    HGB 9.9 (L) 11.5 - 16.0 g/dL    HCT 31.3 (L) 35.0 - 47.0 %    MCV 86.5 80.0 - 99.0 FL    MCH 27.3 26.0 - 34.0 PG    MCHC 31.6 30.0 - 36.5 g/dL    RDW 15.7 (H) 11.5 - 14.5 %    PLATELET 203 150 - 400 K/uL    MPV 11.2 8.9 - 12.9 FL    NRBC 0.0 0 PER 100 WBC    ABSOLUTE NRBC 0.00 0.00 - 0.01 K/uL    NEUTROPHILS 26 (L) 32 - 75 %    LYMPHOCYTES 55 (H) 12 - 49 %    MONOCYTES 11 5 - 13 %    EOSINOPHILS 8 (H) 0 - 7 %    BASOPHILS 0 0 - 1 %    IMMATURE GRANULOCYTES 0 0.0 - 0.5 %    ABS. NEUTROPHILS 1.3 (L) 1.8 - 8.0 K/UL    ABS. LYMPHOCYTES 2.6 0.8 - 3.5 K/UL    ABS. MONOCYTES 0.5 0.0 - 1.0 K/UL    ABS. EOSINOPHILS 0.4 0.0 - 0.4 K/UL    ABS. BASOPHILS 0.0 0.0 - 0.1 K/UL    ABS. IMM. GRANS. 0.0 0.00 - 0.04 K/UL    DF AUTOMATED     ECHO ADULT COMPLETE     Collection Time: 07/23/19  9:22 AM   Result Value Ref Range    IVSd 0.69 0.6 - 0.9 cm    LVIDd 4.72 3.9 - 5.3 cm    LVIDs 2.95 cm    LVOT d 1.88 cm    LVPWd 0.87 0.6 - 0.9 cm    LVOT Peak Gradient 5.36 mmHg    LVOT SV 62.8 mL    LVOT Peak Velocity 115.80 cm/s    LVOT VTI 22.66 cm    RVIDd 3.83 cm    RVSP 32.14 mmHg    RVSP 30.15 mmHg    RVSP 32.02 mmHg    RVSP 28.15 mmHg    RVSP 32.14 mmHg    Left Atrium Major Axis 3.31 cm    LA Volume 57.21 22 - 52 mL    LA Area 4C 19.09 cm2    LA Vol 2C 56.44 (A) 22 - 52 mL    LA Vol 4C 49.34 22 - 52 mL    Est. RA Pressure 3.00 mmHg    Aortic Valve Area by Continuity of Peak Velocity 1.99 cm2    Aortic Valve Area by Continuity of Peak Velocity 1.99 cm2    Aortic Valve Area by Continuity of VTI 1.91 cm2    Aortic Valve Area by Continuity of VTI 1.91 cm2    AoV PG 10.39 mmHg    Aortic Valve Systolic Mean Gradient Q000111Q mmHg    Aortic Valve Systolic Peak Velocity [id number] cm/s    AoV VTI 32.90 cm    MV A Vel 54.66 cm/s    Mitral Valve E Wave Deceleration Time 0.16 s    MV E Vel 96.30 cm/s    MV E/A 1.76     E/E' ratio (averaged) 7.31     E/E' lateral 5.38     E/E' septal 9.24  LV E' Lateral Velocity 17.91 cm/s    LV E' Septal Velocity 10.42 cm/s    Mitral Valve Pressure Half-time 0.05 s    MVA (PHT) 4.63 cm2    Pulmonic Valve Systolic Peak Instantaneous Gradient 5.16 mmHg    Pulmonic Regurgitant End Max Velocity 113.58 cm/s    Pulmonic Valve Systolic Peak Instantaneous Gradient 3.88 mmHg    Pulmonic Valve Max Velocity 98.50 cm/s    Tapse 2.27 (A) 1.5 - 2.0 cm    Triscuspid Valve Regurgitation Peak Gradient 29.14 mmHg    Triscuspid Valve Regurgitation Peak Gradient 27.15 mmHg    Triscuspid Valve Regurgitation Peak Gradient 29.02 mmHg    Triscuspid Valve Regurgitation Peak Gradient 25.15 mmHg    Triscuspid Valve Regurgitation Peak Gradient 29.14 mmHg    TR Max Velocity 269.90 cm/s    TR Max Velocity 260.54 cm/s    TR Max Velocity 269.33 cm/s    TR Max Velocity 250.74 cm/s     TR Max Velocity 269.90 cm/s    Ao Root D 2.71 cm    LV Mass AL 119.2 67 - 162 g    LV Mass AL Index 63.7 43 - 95 g/m2    Left Atrium Minor Axis 1.77 cm    LA Vol Index 30.58 16 - 28 ml/m2    LA Vol Index 30.17 16 - 28 ml/m2    LA Vol Index 26.37 16 - 28 ml/m2     Recent Labs     07/23/19  0603 07/22/19  0630   WBC 4.8 5.7   HGB 9.9* 10.1*   HCT 31.3* 32.2*   PLT 203 204     Recent Labs     07/23/19  0603 07/22/19  0630 07/21/19  0127   NA 143 142 138   K 4.0 3.2* 3.2*   CL 111* 111* 105   CO2 26 25 25    BUN 11 8 9    CREA 0.97 1.01 1.06*   GLU 97 80 114*   CA 8.8 8.5 9.1     Recent Labs     07/21/19  0127   ALT 25   AP 78   TBILI 0.6   TP 8.0   ALB 3.6   GLOB 4.4*     Recent Labs     07/22/19  0630 07/21/19  0619   INR 1.1 1.1   PTP 11.3* 11.5*   APTT 28.2 26.8      Recent Labs     07/21/19  0619   FERR 49      Lab Results   Component Value Date/Time    Folate 5.4 03/14/2019 05:47 AM      No results for input(s): PH, PCO2, PO2 in the last 72 hours.  Recent Labs     07/21/19  0127   CPK 125   TROIQ <0.05     No results found for: CHOL, CHOLX, CHLST, CHOLV, HDL, HDLP, LDL, LDLC, DLDLP, TGLX, TRIGL, TRIGP, CHHD, CHHDX  Lab Results   Component Value Date/Time    Glucose (POC) 144 (H) 03/14/2019 09:56 AM     Lab Results   Component Value Date/Time    Color YELLOW/STRAW 07/21/2019 03:44 AM    Appearance CLEAR 07/21/2019 03:44 AM    Specific gravity 1.008 07/21/2019 03:44 AM    Specific gravity 1.029 09/14/2009 08:45 PM    pH (UA) 7.0 07/21/2019 03:44 AM    Protein Negative 07/21/2019 03:44 AM    Glucose Negative 07/21/2019 03:44 AM  Ketone Negative 07/21/2019 03:44 AM    Bilirubin Negative 07/21/2019 03:44 AM    Urobilinogen 1.0 07/21/2019 03:44 AM    Nitrites Negative 07/21/2019 03:44 AM    Leukocyte Esterase TRACE (A) 07/21/2019 03:44 AM    Epithelial cells FEW 07/21/2019 03:44 AM    Bacteria Negative 07/21/2019 03:44 AM    WBC 0-4 07/21/2019 03:44 AM    RBC 0-5 07/21/2019 03:44 AM       Med list reviewed   Current Facility-Administered Medications   Medication Dose Route Frequency   ??? influenza vaccine 2020-21 (6 mos+)(PF) (FLUARIX/FLULAVAL/FLUZONE QUAD) injection 0.5 mL  0.5 mL IntraMUSCular PRIOR TO DISCHARGE   ??? diphenhydrAMINE (BENADRYL) capsule 25 mg  25 mg Oral Q6H PRN   ??? ondansetron (ZOFRAN) injection 4 mg  4 mg IntraVENous Q6H PRN   ??? methadone (DOLOPHINE) 5 mg/5 mL oral solution 85 mg  85 mg Oral DAILY   ??? sodium chloride (NS) flush 5-40 mL  5-40 mL IntraVENous Q8H   ??? sodium chloride (NS) flush 5-40 mL  5-40 mL IntraVENous PRN   ??? acetaminophen (TYLENOL) tablet 650 mg  650 mg Oral Q6H PRN    Or   ??? acetaminophen (TYLENOL) suppository 650 mg  650 mg Rectal Q6H PRN   ??? polyethylene glycol (MIRALAX) packet 17 g  17 g Oral DAILY PRN   ??? enoxaparin (LOVENOX) injection 40 mg  40 mg SubCUTAneous DAILY   ??? piperacillin-tazobactam (ZOSYN) 3.375 g in 0.9% sodium chloride (MBP/ADV) 100 mL  3.375 g IntraVENous Q8H   ??? gabapentin (NEURONTIN) capsule 800 mg  800 mg Oral TID   ??? meloxicam (MOBIC) tablet 7.5 mg  7.5 mg Oral DAILY   ??? mirtazapine (REMERON) tablet 15 mg  15 mg Oral QHS       Care Plan discussed with:  Patient/Family and Nurse    Sammuel Hines, MD  Internal Medicine  Date of Service: 07/23/2019

## 2019-07-24 LAB — CBC WITH AUTO DIFFERENTIAL
Basophils %: 0 % (ref 0–1)
Basophils Absolute: 0 10*3/uL (ref 0.0–0.1)
Eosinophils %: 9 % — ABNORMAL HIGH (ref 0–7)
Eosinophils Absolute: 0.5 10*3/uL — ABNORMAL HIGH (ref 0.0–0.4)
Granulocyte Absolute Count: 0 10*3/uL (ref 0.00–0.04)
Hematocrit: 33.4 % — ABNORMAL LOW (ref 35.0–47.0)
Hemoglobin: 10.5 g/dL — ABNORMAL LOW (ref 11.5–16.0)
Immature Granulocytes: 0 % (ref 0.0–0.5)
Lymphocytes %: 51 % — ABNORMAL HIGH (ref 12–49)
Lymphocytes Absolute: 2.9 10*3/uL (ref 0.8–3.5)
MCH: 27.2 PG (ref 26.0–34.0)
MCHC: 31.4 g/dL (ref 30.0–36.5)
MCV: 86.5 FL (ref 80.0–99.0)
MPV: 11.2 FL (ref 8.9–12.9)
Monocytes %: 8 % (ref 5–13)
Monocytes Absolute: 0.5 10*3/uL (ref 0.0–1.0)
NRBC Absolute: 0 10*3/uL (ref 0.00–0.01)
Neutrophils %: 32 % (ref 32–75)
Neutrophils Absolute: 1.8 10*3/uL (ref 1.8–8.0)
Nucleated RBCs: 0 PER 100 WBC
Platelets: 236 10*3/uL (ref 150–400)
RBC: 3.86 M/uL (ref 3.80–5.20)
RDW: 15.6 % — ABNORMAL HIGH (ref 11.5–14.5)
WBC: 5.7 10*3/uL (ref 3.6–11.0)

## 2019-07-24 LAB — BASIC METABOLIC PANEL
Anion Gap: 4 mmol/L — ABNORMAL LOW (ref 5–15)
BUN: 12 MG/DL (ref 6–20)
Bun/Cre Ratio: 11 — ABNORMAL LOW (ref 12–20)
CO2: 26 mmol/L (ref 21–32)
Calcium: 8.5 MG/DL (ref 8.5–10.1)
Chloride: 110 mmol/L — ABNORMAL HIGH (ref 97–108)
Creatinine: 1.05 MG/DL — ABNORMAL HIGH (ref 0.55–1.02)
EGFR IF NonAfrican American: 56 mL/min/{1.73_m2} — ABNORMAL LOW (ref >60)
GFR African American: >60 mL/min/{1.73_m2} (ref >60)
Glucose: 85 mg/dL (ref 65–100)
Potassium: 4.2 mmol/L (ref 3.5–5.1)
Sodium: 140 mmol/L (ref 136–145)

## 2019-07-24 LAB — MAGNESIUM
Magnesium: 2 mg/dL (ref 1.6–2.4)
Magnesium: 2 mg/dL (ref 1.6–2.4)

## 2019-07-24 LAB — CBC WITH AUTOMATED DIFF
ABS. BASOPHILS: 0 10*3/uL (ref 0.0–0.1)
ABS. EOSINOPHILS: 0.5 10*3/uL — ABNORMAL HIGH (ref 0.0–0.4)
ABS. IMM. GRANS.: 0 10*3/uL (ref 0.00–0.04)
ABS. LYMPHOCYTES: 2.9 10*3/uL (ref 0.8–3.5)
ABS. MONOCYTES: 0.5 10*3/uL (ref 0.0–1.0)
ABS. NEUTROPHILS: 1.8 10*3/uL (ref 1.8–8.0)
ABSOLUTE NRBC: 0 10*3/uL (ref 0.00–0.01)
BASOPHILS: 0 % (ref 0–1)
EOSINOPHILS: 9 % — ABNORMAL HIGH (ref 0–7)
HCT: 33.4 % — ABNORMAL LOW (ref 35.0–47.0)
HGB: 10.5 g/dL — ABNORMAL LOW (ref 11.5–16.0)
IMMATURE GRANULOCYTES: 0 % (ref 0.0–0.5)
LYMPHOCYTES: 51 % — ABNORMAL HIGH (ref 12–49)
MCH: 27.2 PG (ref 26.0–34.0)
MCHC: 31.4 g/dL (ref 30.0–36.5)
MCV: 86.5 FL (ref 80.0–99.0)
MONOCYTES: 8 % (ref 5–13)
MPV: 11.2 FL (ref 8.9–12.9)
NEUTROPHILS: 32 % (ref 32–75)
NRBC: 0 PER 100 WBC
PLATELET: 236 10*3/uL (ref 150–400)
RBC: 3.86 M/uL (ref 3.80–5.20)
RDW: 15.6 % — ABNORMAL HIGH (ref 11.5–14.5)
WBC: 5.7 10*3/uL (ref 3.6–11.0)

## 2019-07-24 LAB — METABOLIC PANEL, BASIC
Anion gap: 4 mmol/L — ABNORMAL LOW (ref 5–15)
BUN/Creatinine ratio: 11 — ABNORMAL LOW (ref 12–20)
BUN: 12 MG/DL (ref 6–20)
CO2: 26 mmol/L (ref 21–32)
Calcium: 8.5 MG/DL (ref 8.5–10.1)
Chloride: 110 mmol/L — ABNORMAL HIGH (ref 97–108)
Creatinine: 1.05 MG/DL — ABNORMAL HIGH (ref 0.55–1.02)
GFR est AA: >60 mL/min/{1.73_m2} (ref >60)
GFR est non-AA: 56 mL/min/{1.73_m2} — ABNORMAL LOW (ref >60)
Glucose: 85 mg/dL (ref 65–100)
Potassium: 4.2 mmol/L (ref 3.5–5.1)
Sodium: 140 mmol/L (ref 136–145)

## 2019-07-24 MED ORDER — HYDROCORTISONE 1 % TOPICAL CREAM
1 % | CUTANEOUS | Status: DC | PRN
Start: 2019-07-24 — End: 2019-07-25

## 2019-07-24 MED FILL — GABAPENTIN 400 MG CAP: 400 mg | ORAL | Qty: 2

## 2019-07-24 MED FILL — METHADONE 1 MG/ML ORAL SOLN: 5 mg/ mL | ORAL | Qty: 85

## 2019-07-24 MED FILL — BANOPHEN 25 MG CAPSULE: 25 mg | ORAL | Qty: 1

## 2019-07-24 MED FILL — MELOXICAM 7.5 MG TAB: 7.5 mg | ORAL | Qty: 1

## 2019-07-24 MED FILL — ENOXAPARIN 40 MG/0.4 ML SUB-Q SYRINGE: 40 mg/0.4 mL | SUBCUTANEOUS | Qty: 0.4

## 2019-07-24 MED FILL — HYDROCORTISONE 1 % TOPICAL CREAM: 1 % | CUTANEOUS | Qty: 28

## 2019-07-24 MED FILL — MIRTAZAPINE 15 MG TAB: 15 mg | ORAL | Qty: 1

## 2019-07-24 NOTE — Progress Notes (Signed)
[facility]               Division of Cardiology  [phone]                       Progress note              Madolyn Frieze M.D., F.A.C.C.                                      NAMEYocelyn Bell   DOB:   02-25-1971   MRN:   [id number]         ICD-10-CM ICD-9-CM    1. Substance abuse ([place])  F19.10 305.90    2. Fever, unspecified fever cause  R50.9 780.60    3. Heart murmur  R01.1 785.2                  HPI  No recurrent fever      Assessment/Plan:  No recurrent fever   normal wbcs  And negative blood cx thus far  TEE in hold for now unles of a change in status    We will sign off but remain available as needed             CARDIAC STUDIES      07/21/19   ECHO ADULT COMPLETE 07/23/2019 07/23/2019    Narrative ?? LV: Estimated LVEF is 60 - 65%. Visually measured ejection fraction.   Normal cavity size, wall thickness and systolic function (ejection   fraction normal).  ?? TV: Mild tricuspid valve regurgitation is present.  ?? PV: Mild pulmonic valve regurgitation is present.  ?? PA: Pulmonary arterial systolic pressure is 30 mmHg.        Signed by: Anita Nab, MD                  EKG Results     Procedure 720 Value Units Date/Time    EKG 12 LEAD INITIAL [[id number]] Collected:  07/21/19 0218    Order Status:  Completed Updated:  07/21/19 1117     Ventricular Rate 67 BPM      Atrial Rate 67 BPM      P-R Interval 166 ms      QRS Duration 82 ms      Q-T Interval 424 ms      QTC Calculation (Bezet) 448 ms      Calculated P Axis 59 degrees      Calculated R Axis 5 degrees      Calculated T Axis 0 degrees      Diagnosis --     Normal sinus rhythm  Nonspecific T wave abnormality  No previous ECGs available  Confirmed by Larey Seat, M.D., [name] [phone]) on 07/21/2019 11:17:15 AM              IMAGING      CT Results (most recent):  Results from [place] encounter on 03/12/19   CT HEAD WO CONT    Narrative EXAM: CT HEAD WO CONT     INDICATION: hx of MS, worsening RLE weakness and slurred speech    COMPARISON: None.    CONTRAST: None.    TECHNIQUE: Unenhanced CT of the head was performed using 5 mm images. Brain and  bone windows were generated. Coronal  and sagittal reformats. CT dose reduction  was achieved through use of a standardized protocol tailored for this  examination and automatic exposure control for dose modulation.      FINDINGS:  The ventricles and sulci are normal in size, shape and configuration.. There is  no significant white matter disease. There is no intracranial hemorrhage,  extra-axial collection, or mass effect. The basilar cisterns are open. No CT  evidence of acute infarct.    The bone windows demonstrate no abnormalities. The visualized portions of the  paranasal sinuses and mastoid air cells are clear.      Impression IMPRESSION:   No evidence of acute abnormality           XR Results (most recent):  Results from Hospital Encounter encounter on 07/21/19   XR CHEST PORT    Narrative INDICATION: Fever, eval for pna    EXAM:  AP CHEST RADIOGRAPH    COMPARISON: June 11, 2012    FINDINGS:    AP portable view of the chest demonstrates a normal cardiomediastinal  silhouette. There is no edema, effusion, consolidation, or pneumothorax. The  osseous structures are unremarkable.      Impression IMPRESSION:  No acute process.        MRI Results (most recent):  Results from [place] encounter on 03/12/19   MRI LUMB SPINE WO CONT    Narrative EXAM: MRI LUMB SPINE WO CONT  Clinical history: Right leg weakness, back pain  INDICATION: Right leg weakness and back pain. Please include T11 and T12    COMPARISON: Lumbar spine x-ray performed 01/23/2016    TECHNIQUE: MR imaging of the lumbar spine was performed using the following  sequences: sagittal T1, T2, STIR;  axial T1, T2.     CONTRAST:  None.    FINDINGS:    Discogenic marrow degenerative marrow degenerative changes are greatest at   L4-L5. Loss of vertebral disc height at L4-L5. Acute fracture or dislocation.  There is no abnormality identified of T12 or T11. The superior margin of T11 is  not included on examination. No distal thoracic canal stenosis. Chronic severe  degenerative changes at L4-5.    The conus medullaris terminates at T12-L1. Signal and caliber of the distal  spinal cord are within normal limits.     The paraspinal soft tissues are within normal limits.    Lower thoracic spine: No herniation or stenosis.    L1-L2: No herniation or stenosis.    L2-L3: No herniation or stenosis.    L3-L4: No herniation or stenosis. Minimal facet arthropathy. The canal is  patent. Foramina are patent.    L4-L5: Facet arthropathy. Chronic loss of vertebral body height. Disc  bulge/osteophyte. Canal is patent. Foramina are patent. Type I Modic changes.    L5-S1: Minimal facet arthropathy. Canal and foramina are patent.           Impression IMPRESSION:  Chronic degenerative changes at L4-L5 without evidence of  canal or foraminal  compromise.   Discogenic marrow degenerative changes are most pronounced at L4-5.           Past Medical History:   Diagnosis Date   ??? ADHD    ??? Asthma    ??? Depression    ??? Fibromyalgia    ??? Gastroenteritis    ??? Hydradenitis     suppurativa   ??? Osteoarthritis    ??? Other ill-defined conditions(799.89)     chronic back pain   ??? Psychiatric disorder  Past Surgical History:   Procedure Laterality Date   ??? HX GYN      tubes tided               Visit Vitals  BP 129/79 (BP 1 Location: Left arm, BP Patient Position: At rest)   Pulse 60   Temp 98.8 ??F (37.1 ??C)   Resp 16   Ht 5' 3"  (1.6 m)   Wt 185 lb (83.9 kg)   SpO2 100%   BMI 32.77 kg/m??         Wt Readings from Last 3 Encounters:   07/23/19 185 lb (83.9 kg)   03/15/19 160 lb 11.2 oz (72.9 kg)   03/14/19 159 lb 2.8 oz (72.2 kg)         Review of Systems:   Pertinent items are noted in the History of Present Illness.         No intake/output data recorded.     09/30 1901 - 10/02 0700  In: -   Out: 300 [Urine:300]      Telemetry: normal sinus rhythm    Physical Exam:      Current Facility-Administered Medications   Medication Dose Route Frequency   ??? influenza vaccine 2020-21 (6 mos+)(PF) (FLUARIX/FLULAVAL/FLUZONE QUAD) injection 0.5 mL  0.5 mL IntraMUSCular PRIOR TO DISCHARGE   ??? diphenhydrAMINE (BENADRYL) capsule 25 mg  25 mg Oral Q6H PRN   ??? ondansetron (ZOFRAN) injection 4 mg  4 mg IntraVENous Q6H PRN   ??? methadone (DOLOPHINE) 5 mg/5 mL oral solution 85 mg  85 mg Oral DAILY   ??? sodium chloride (NS) flush 5-40 mL  5-40 mL IntraVENous Q8H   ??? sodium chloride (NS) flush 5-40 mL  5-40 mL IntraVENous PRN   ??? acetaminophen (TYLENOL) tablet 650 mg  650 mg Oral Q6H PRN    Or   ??? acetaminophen (TYLENOL) suppository 650 mg  650 mg Rectal Q6H PRN   ??? polyethylene glycol (MIRALAX) packet 17 g  17 g Oral DAILY PRN   ??? enoxaparin (LOVENOX) injection 40 mg  40 mg SubCUTAneous DAILY   ??? gabapentin (NEURONTIN) capsule 800 mg  800 mg Oral TID   ??? meloxicam (MOBIC) tablet 7.5 mg  7.5 mg Oral DAILY   ??? mirtazapine (REMERON) tablet 15 mg  15 mg Oral QHS         All Cardiac Markers in the last 24 hours:  No results found for: CPK, CK, CKMMB, CKMB, RCK3, CKMBT, CKMBPOC, CKNDX, CKND1, MYO, TROPT, TROIQ, TROI, TROPT, TNIPOC, BNP, BNPP, BNPNT     Lab Results   Component Value Date/Time    Creatinine 1.05 (H) 07/24/2019 05:38 AM          Lab Results   Component Value Date/Time    CK 125 07/21/2019 01:27 AM    Troponin-I, Qt. <0.05 07/21/2019 01:27 AM         Lab Results   Component Value Date/Time    WBC 4.8 07/23/2019 06:03 AM    HGB 9.9 (L) 07/23/2019 06:03 AM    HCT 31.3 (L) 07/23/2019 06:03 AM    PLATELET 203 07/23/2019 06:03 AM    MCV 86.5 07/23/2019 06:03 AM         Lab Results   Component Value Date/Time    Sodium 140 07/24/2019 05:38 AM    Potassium 4.2 07/24/2019 05:38 AM    Chloride 110 (H) 07/24/2019 05:38 AM    CO2 26 07/24/2019 05:38 AM    Anion gap  4 (L) 07/24/2019 05:38 AM     Glucose 85 07/24/2019 05:38 AM    BUN 12 07/24/2019 05:38 AM    Creatinine 1.05 (H) 07/24/2019 05:38 AM    BUN/Creatinine ratio 11 (L) 07/24/2019 05:38 AM    GFR est AA >60 07/24/2019 05:38 AM    GFR est non-AA 56 (L) 07/24/2019 05:38 AM    Calcium 8.5 07/24/2019 05:38 AM         No results found for: BNP, BNPP, BNPPPOC, XBNPT, BNPNT      No results found for: CHOL, CHOLPOCT, CHOLX, CHLST, CHOLV, HDL, HDLPOC, HDLP, LDL, LDLCPOC, LDLC, DLDLP, VLDLC, VLDL, TGLX, TRIGL, TRIGP, TGLPOCT, CHHD, CHHDX      Lab Results   Component Value Date/Time    ALT (SGPT) 25 07/21/2019 01:27 AM    Alk. phosphatase 78 07/21/2019 01:27 AM    Bilirubin, total 0.6 07/21/2019 01:27 AM

## 2019-07-24 NOTE — Progress Notes (Signed)
Hospitalist Progress Note      Hospital summary: Anita Bell is a 48 y.o. female past medical history significant for polysubstance abuse, psychiatric disorders presented emergency room complaining on a fever.  She stated that today she started having fevers and chills at home.  Unknown how high it was temperature at home.  She reports using cocaine and heroin around 7 PM and afterward she started feeling feverish.  She denies any symptoms such as headache, cough, shortness of breath, chest pain, sore throat, runny nose, nausea, vomiting, diarrhea, urinary symptom.  On arrival to the emergency room patient was found to be febrile with a temperature of 100.9 lethargic but arousable.  Work-up overall unremarkable.  Given her risk factor admission was requested.  Patient awake and alert at this time 07/21/2019      Assessment/Plan:  Fever (POA):??unknown etiology.??improved  - concern for bacteremia/endocarditis given hx IV drug use and new murmur vs viral infection.   ???COVID 19 test negative  ???Blood culture NGTD  - normal wbc, lactic. Elevated  pro cal 19.55   - CXR: no acute process   ??? abx stopped   - ID consulted, will be discharged tomorrow if afebrile      ??  ??Systolic Murmur: new finding as per pt report.   - Echocardiogram ordered  Cardiology consulted  As per cards no need for TEE unless any changes in symptoms   ??  Polysubstance abuse:  - UDS: positive for cocaine, methadone, opiates and THC  - Monitor for signs of withdrawal. Supportive care    Hx Chronic back pain   - on methadone and gabapentin     Hypokalemia - replaced     Code status: Full  DVT prophylaxis: Lovenox   Disposition: TBD. Home likely when ready  ----------------------------------------------    CC: Fever    S: Patient seen and examined at bedside this morning . She feels better. No fevers, no acute issues overnight     Review of Systems:  A comprehensive review of systems was negative.    O:  Visit Vitals   BP 129/72 (BP 1 Location: Left arm, BP Patient Position: At rest)   Pulse 78   Temp 98.4 ??F (36.9 ??C)   Resp 16   Ht 5\' 3"  (1.6 m)   Wt 83.9 kg (185 lb)   SpO2 98%   BMI 32.77 kg/m??       PHYSICAL EXAM:  Gen: NAD  HEENT: anicteric sclerae, normal conjunctiva, oropharynx clear, MM moist  Neck: supple, trachea midline, no adenopathy  Heart: RRR  Lungs: CTA b/l, non-labored respirations  Abd: soft, NT, ND, BS+, no organomegaly  Extr: warm  Skin: dry, no rash  Neuro:  normal speech, moves all extremities        Intake/Output Summary (Last 24 hours) at 07/24/2019 1709  Last data filed at 07/23/2019 2104  Gross per 24 hour   Intake ???   Output 300 ml   Net -300 ml        Recent labs & imaging reviewed:  Recent Results (from the past 24 hour(s))   METABOLIC PANEL, BASIC    Collection Time: 07/24/19  5:38 AM   Result Value Ref Range    Sodium 140 136 - 145 mmol/L    Potassium 4.2 3.5 - 5.1 mmol/L    Chloride 110 (H) 97 - 108 mmol/L    CO2 26 21 - 32 mmol/L    Anion gap 4 (L) 5 - 15 mmol/L  Glucose 85 65 - 100 mg/dL    BUN 12 6 - 20 MG/DL    Creatinine 1.05 (H) 0.55 - 1.02 MG/DL    BUN/Creatinine ratio 11 (L) 12 - 20      GFR est AA >60 >60 ml/min/1.39m2    GFR est non-AA 56 (L) >60 ml/min/1.91m2    Calcium 8.5 8.5 - 10.1 MG/DL   MAGNESIUM    Collection Time: 07/24/19  5:38 AM   Result Value Ref Range    Magnesium 2.0 1.6 - 2.4 mg/dL   CBC WITH AUTOMATED DIFF    Collection Time: 07/24/19 11:50 AM   Result Value Ref Range    WBC 5.7 3.6 - 11.0 K/uL    RBC 3.86 3.80 - 5.20 M/uL    HGB 10.5 (L) 11.5 - 16.0 g/dL    HCT 33.4 (L) 35.0 - 47.0 %    MCV 86.5 80.0 - 99.0 FL    MCH 27.2 26.0 - 34.0 PG    MCHC 31.4 30.0 - 36.5 g/dL    RDW 15.6 (H) 11.5 - 14.5 %    PLATELET 236 150 - 400 K/uL    MPV 11.2 8.9 - 12.9 FL    NRBC 0.0 0 PER 100 WBC    ABSOLUTE NRBC 0.00 0.00 - 0.01 K/uL    NEUTROPHILS 32 32 - 75 %    LYMPHOCYTES 51 (H) 12 - 49 %    MONOCYTES 8 5 - 13 %    EOSINOPHILS 9 (H) 0 - 7 %    BASOPHILS 0 0 - 1 %     IMMATURE GRANULOCYTES 0 0.0 - 0.5 %    ABS. NEUTROPHILS 1.8 1.8 - 8.0 K/UL    ABS. LYMPHOCYTES 2.9 0.8 - 3.5 K/UL    ABS. MONOCYTES 0.5 0.0 - 1.0 K/UL    ABS. EOSINOPHILS 0.5 (H) 0.0 - 0.4 K/UL    ABS. BASOPHILS 0.0 0.0 - 0.1 K/UL    ABS. IMM. GRANS. 0.0 0.00 - 0.04 K/UL    DF AUTOMATED       Recent Labs     07/24/19  1150 07/23/19  0603   WBC 5.7 4.8   HGB 10.5* 9.9*   HCT 33.4* 31.3*   PLT 236 203     Recent Labs     07/24/19  0538 07/23/19  0603 07/22/19  0630   NA 140 143 142   K 4.2 4.0 3.2*   CL 110* 111* 111*   CO2 26 26 25    BUN 12 11 8    CREA 1.05* 0.97 1.01   GLU 85 97 80   CA 8.5 8.8 8.5   MG 2.0  --   --      No results for input(s): ALT, AP, TBIL, TBILI, TP, ALB, GLOB, GGT, AML, LPSE in the last 72 hours.    No lab exists for component: SGOT, GPT, AMYP, HLPSE  Recent Labs     07/22/19  0630   INR 1.1   PTP 11.3*   APTT 28.2      No results for input(s): FE, TIBC, PSAT, FERR in the last 72 hours.   Lab Results   Component Value Date/Time    Folate 5.4 03/14/2019 05:47 AM      No results for input(s): PH, PCO2, PO2 in the last 72 hours.  No results for input(s): CPK, CKNDX, TROIQ in the last 72 hours.    No lab exists for component: CPKMB  No results found for:  CHOL, CHOLX, CHLST, CHOLV, HDL, HDLP, LDL, LDLC, DLDLP, TGLX, TRIGL, TRIGP, CHHD, CHHDX  Lab Results   Component Value Date/Time    Glucose (POC) 144 (H) 03/14/2019 09:56 AM     Lab Results   Component Value Date/Time    Color YELLOW/STRAW 07/21/2019 03:44 AM    Appearance CLEAR 07/21/2019 03:44 AM    Specific gravity 1.008 07/21/2019 03:44 AM    Specific gravity 1.029 09/14/2009 08:45 PM    pH (UA) 7.0 07/21/2019 03:44 AM    Protein Negative 07/21/2019 03:44 AM    Glucose Negative 07/21/2019 03:44 AM    Ketone Negative 07/21/2019 03:44 AM    Bilirubin Negative 07/21/2019 03:44 AM    Urobilinogen 1.0 07/21/2019 03:44 AM    Nitrites Negative 07/21/2019 03:44 AM    Leukocyte Esterase TRACE (A) 07/21/2019 03:44 AM     Epithelial cells FEW 07/21/2019 03:44 AM    Bacteria Negative 07/21/2019 03:44 AM    WBC 0-4 07/21/2019 03:44 AM    RBC 0-5 07/21/2019 03:44 AM       Med list reviewed  Current Facility-Administered Medications   Medication Dose Route Frequency   ??? hydrocortisone (CORTAID) 1 % cream   Topical PRN   ??? influenza vaccine 2020-21 (6 mos+)(PF) (FLUARIX/FLULAVAL/FLUZONE QUAD) injection 0.5 mL  0.5 mL IntraMUSCular PRIOR TO DISCHARGE   ??? diphenhydrAMINE (BENADRYL) capsule 25 mg  25 mg Oral Q6H PRN   ??? ondansetron (ZOFRAN) injection 4 mg  4 mg IntraVENous Q6H PRN   ??? methadone (DOLOPHINE) 5 mg/5 mL oral solution 85 mg  85 mg Oral DAILY   ??? sodium chloride (NS) flush 5-40 mL  5-40 mL IntraVENous Q8H   ??? sodium chloride (NS) flush 5-40 mL  5-40 mL IntraVENous PRN   ??? acetaminophen (TYLENOL) tablet 650 mg  650 mg Oral Q6H PRN    Or   ??? acetaminophen (TYLENOL) suppository 650 mg  650 mg Rectal Q6H PRN   ??? polyethylene glycol (MIRALAX) packet 17 g  17 g Oral DAILY PRN   ??? enoxaparin (LOVENOX) injection 40 mg  40 mg SubCUTAneous DAILY   ??? gabapentin (NEURONTIN) capsule 800 mg  800 mg Oral TID   ??? meloxicam (MOBIC) tablet 7.5 mg  7.5 mg Oral DAILY   ??? mirtazapine (REMERON) tablet 15 mg  15 mg Oral QHS       Care Plan discussed with:  Patient/Family and Nurse    Sammuel Hines, MD  Internal Medicine  Date of Service: 07/24/2019

## 2019-07-24 NOTE — Progress Notes (Signed)
Bedside and Verbal shift change report given to janet (Cabin crew) by [place] Calix (offgoing nurse). Report included the following information SBAR, Kardex, MAR and Recent Results.

## 2019-07-24 NOTE — Progress Notes (Signed)
ID Progress Note  07/24/2019    Subjective:     Afebrile. No dyspnea, abdominal pain, headache or sore throat. Her chronic back pain is better.     ROS: No anaphylaxis, seizures, syncope, hematemesis, hematochezia     Objective:     Vitals:   Visit Vitals  BP 129/79 (BP 1 Location: Left arm, BP Patient Position: At rest)   Pulse 60   Temp 98.8 ??F (37.1 ??C)   Resp 16   Ht '5\' 3"'$  (1.6 m)   Wt 83.9 kg (185 lb)   SpO2 100%   BMI 32.77 kg/m??        Tmax:  Temp (24hrs), Avg:98.6 ??F (37 ??C), Min:98.4 ??F (36.9 ??C), Max:98.8 ??F (37.1 ??C)      Exam:    Not in distress  Pink conjunctivae, anicteric sclerae  No cervical lymphdenopathy   Lung clear, no rales, wheezes or rhonchi   Heart: s1, s2, RRR, no murmurs rubs or clicks  Abdomen: soft nontender, no guarding or rebound  Knees not warm or tender  Speech fluent   No embolic lesions on the fingernails.     Labs:   Lab Results   Component Value Date/Time    WBC 4.8 07/23/2019 06:03 AM    HGB 9.9 (L) 07/23/2019 06:03 AM    HCT 31.3 (L) 07/23/2019 06:03 AM    PLATELET 203 07/23/2019 06:03 AM    MCV 86.5 07/23/2019 06:03 AM     Lab Results   Component Value Date/Time    Sodium 140 07/24/2019 05:38 AM    Potassium 4.2 07/24/2019 05:38 AM    Chloride 110 (H) 07/24/2019 05:38 AM    CO2 26 07/24/2019 05:38 AM    Anion gap 4 (L) 07/24/2019 05:38 AM    Glucose 85 07/24/2019 05:38 AM    BUN 12 07/24/2019 05:38 AM    Creatinine 1.05 (H) 07/24/2019 05:38 AM    BUN/Creatinine ratio 11 (L) 07/24/2019 05:38 AM    GFR est AA >60 07/24/2019 05:38 AM    GFR est non-AA 56 (L) 07/24/2019 05:38 AM    Calcium 8.5 07/24/2019 05:38 AM    Bilirubin, total 0.6 07/21/2019 01:27 AM    Alk. phosphatase 78 07/21/2019 01:27 AM    Protein, total 8.0 07/21/2019 01:27 AM    Albumin 3.6 07/21/2019 01:27 AM    Globulin 4.4 (H) 07/21/2019 01:27 AM    A-G Ratio 0.8 (L) 07/21/2019 01:27 AM    ALT (SGPT) 25 07/21/2019 01:27 AM             Assessment:     #1 fever - resolved   ??  #2 polysubstance abuse  ??  #3 chronic  back pain  ??    Recommendations:       ??  Her blood cultures are negative.  This would normally not meet the diagnostic criteria for endocarditis.  Although culture-negative endocarditis is possible, I do not think she would have defervesced so quickly.      Monitor off abx and observe her fever curve.  If she spikes fever again, I would recommend proceeding with TEE and getting an MRI of the lumbar spine. She tells me that her chronic back pain has been present for 16 years and has not worsened. In fact, it is better today.     If she afebrile by tomorrow morning, she can be discharged.         Teja Costen Sallyanne Havers, MD

## 2019-07-24 NOTE — Progress Notes (Signed)
Bedside shift change report given to [place], RN (Cabin crew) by Morrie Sheldon, RN (offgoing nurse). Report included the following information SBAR, Kardex, Intake/Output, MAR and Recent Results.

## 2019-07-24 NOTE — Progress Notes (Signed)
[facility]               Division of Cardiology  [phone]                       Progress note              Anita Bell M.D., F.A.C.C.                                      NAMEKaysen Bell   DOB:   [birth year]   MRN:   [id number]         ICD-10-CM ICD-9-CM    1. Substance abuse ([place])  F19.10 305.90    2. Fever, unspecified fever cause  R50.9 780.60    3. Heart murmur  R01.1 785.2                  HPI  No recurrent fever      Assessment/Plan:  No recurrent fever   normal wbcs  And negative blood cx thus far  TEE in hold for now unles of a change in status    We will sign off but remain available as needed             CARDIAC STUDIES      07/21/19   ECHO ADULT COMPLETE 07/23/2019 07/23/2019    Narrative ?? LV: Estimated LVEF is 60 - 65%. Visually measured ejection fraction.   Normal cavity size, wall thickness and systolic function (ejection   fraction normal).  ?? TV: Mild tricuspid valve regurgitation is present.  ?? PV: Mild pulmonic valve regurgitation is present.  ?? PA: Pulmonary arterial systolic pressure is 30 mmHg.        Signed by: Anita Nab, MD                  EKG Results     Procedure 720 Value Units Date/Time    EKG 12 LEAD INITIAL [[id number]] Collected:  07/21/19 0218    Order Status:  Completed Updated:  07/21/19 1117     Ventricular Rate 67 BPM      Atrial Rate 67 BPM      P-R Interval 166 ms      QRS Duration 82 ms      Q-T Interval 424 ms      QTC Calculation (Bezet) 448 ms      Calculated P Axis 59 degrees      Calculated R Axis 5 degrees      Calculated T Axis 0 degrees      Diagnosis --     Normal sinus rhythm  Nonspecific T wave abnormality  No previous ECGs available  Confirmed by Larey Bell, M.D., Anita [phone]) on 07/21/2019 11:17:15 AM              IMAGING      CT Results (most recent):  Results from [facility] encounter on 03/12/19   CT HEAD WO CONT    Narrative EXAM: CT HEAD WO CONT    INDICATION: hx of MS,  worsening RLE weakness and slurred speech    COMPARISON: None.    CONTRAST: None.    TECHNIQUE: Unenhanced CT of the head was performed using 5 mm images. Brain and  bone windows were generated. Coronal  and sagittal reformats. CT dose reduction  was achieved through use of a standardized protocol tailored for this  examination and automatic exposure control for dose modulation.      FINDINGS:  The ventricles and sulci are normal in size, shape and configuration.. There is  no significant white matter disease. There is no intracranial hemorrhage,  extra-axial collection, or mass effect. The basilar cisterns are open. No CT  evidence of acute infarct.    The bone windows demonstrate no abnormalities. The visualized portions of the  paranasal sinuses and mastoid air cells are clear.      Impression IMPRESSION:   No evidence of acute abnormality           XR Results (most recent):  Results from Hospital Encounter encounter on 07/21/19   XR CHEST PORT    Narrative INDICATION: Fever, eval for pna    EXAM:  AP CHEST RADIOGRAPH    COMPARISON: June 11, 2012    FINDINGS:    AP portable view of the chest demonstrates a normal cardiomediastinal  silhouette. There is no edema, effusion, consolidation, or pneumothorax. The  osseous structures are unremarkable.      Impression IMPRESSION:  No acute process.        MRI Results (most recent):  Results from [place] encounter on 03/12/19   MRI LUMB SPINE WO CONT    Narrative EXAM: MRI LUMB SPINE WO CONT  Clinical history: Right leg weakness, back pain  INDICATION: Right leg weakness and back pain. Please include T11 and T12    COMPARISON: Lumbar spine x-ray performed 01/23/2016    TECHNIQUE: MR imaging of the lumbar spine was performed using the following  sequences: sagittal T1, T2, STIR;  axial T1, T2.     CONTRAST:  None.    FINDINGS:    Discogenic marrow degenerative marrow degenerative changes are greatest at  L4-L5. Loss of vertebral disc height at L4-L5. Acute  fracture or dislocation.  There is no abnormality identified of T12 or T11. The superior margin of T11 is  not included on examination. No distal thoracic canal stenosis. Chronic severe  degenerative changes at L4-5.    The conus medullaris terminates at T12-L1. Signal and caliber of the distal  spinal cord are within normal limits.     The paraspinal soft tissues are within normal limits.    Lower thoracic spine: No herniation or stenosis.    L1-L2: No herniation or stenosis.    L2-L3: No herniation or stenosis.    L3-L4: No herniation or stenosis. Minimal facet arthropathy. The canal is  patent. Foramina are patent.    L4-L5: Facet arthropathy. Chronic loss of vertebral body height. Disc  bulge/osteophyte. Canal is patent. Foramina are patent. Type I Modic changes.    L5-S1: Minimal facet arthropathy. Canal and foramina are patent.           Impression IMPRESSION:  Chronic degenerative changes at L4-L5 without evidence of  canal or foraminal  compromise.   Discogenic marrow degenerative changes are most pronounced at L4-5.           Past Medical History:   Diagnosis Date   ??? ADHD    ??? Asthma    ??? Depression    ??? Fibromyalgia    ??? Gastroenteritis    ??? Hydradenitis     suppurativa   ??? Osteoarthritis    ??? Other ill-defined conditions(799.89)     chronic back pain   ??? Psychiatric disorder  Past Surgical History:   Procedure Laterality Date   ??? HX GYN      tubes tided               Visit Vitals  BP 129/79 (BP 1 Location: Left arm, BP Patient Position: At rest)   Pulse 60   Temp 98.8 ??F (37.1 ??C)   Resp 16   Ht '5\' 3"'$  (1.6 m)   Wt 185 lb (83.9 kg)   SpO2 100%   BMI 32.77 kg/m??         Wt Readings from Last 3 Encounters:   07/23/19 185 lb (83.9 kg)   03/15/19 160 lb 11.2 oz (72.9 kg)   03/14/19 159 lb 2.8 oz (72.2 kg)         Review of Systems:   Pertinent items are noted in the History of Present Illness.         No intake/output data recorded.    09/30 1901 - 10/02 0700  In: -   Out: 300  [Urine:300]      Telemetry: normal sinus rhythm    Physical Exam:      Current Facility-Administered Medications   Medication Dose Route Frequency   ??? influenza vaccine 2020-21 (6 mos+)(PF) (FLUARIX/FLULAVAL/FLUZONE QUAD) injection 0.5 mL  0.5 mL IntraMUSCular PRIOR TO DISCHARGE   ??? diphenhydrAMINE (BENADRYL) capsule 25 mg  25 mg Oral Q6H PRN   ??? ondansetron (ZOFRAN) injection 4 mg  4 mg IntraVENous Q6H PRN   ??? methadone (DOLOPHINE) 5 mg/5 mL oral solution 85 mg  85 mg Oral DAILY   ??? sodium chloride (NS) flush 5-40 mL  5-40 mL IntraVENous Q8H   ??? sodium chloride (NS) flush 5-40 mL  5-40 mL IntraVENous PRN   ??? acetaminophen (TYLENOL) tablet 650 mg  650 mg Oral Q6H PRN    Or   ??? acetaminophen (TYLENOL) suppository 650 mg  650 mg Rectal Q6H PRN   ??? polyethylene glycol (MIRALAX) packet 17 g  17 g Oral DAILY PRN   ??? enoxaparin (LOVENOX) injection 40 mg  40 mg SubCUTAneous DAILY   ??? gabapentin (NEURONTIN) capsule 800 mg  800 mg Oral TID   ??? meloxicam (MOBIC) tablet 7.5 mg  7.5 mg Oral DAILY   ??? mirtazapine (REMERON) tablet 15 mg  15 mg Oral QHS         All Cardiac Markers in the last 24 hours:  No results found for: CPK, CK, CKMMB, CKMB, RCK3, CKMBT, CKMBPOC, CKNDX, CKND1, MYO, TROPT, TROIQ, TROI, TROPT, TNIPOC, BNP, BNPP, BNPNT     Lab Results   Component Value Date/Time    Creatinine 1.05 (H) 07/24/2019 05:38 AM          Lab Results   Component Value Date/Time    CK 125 07/21/2019 01:27 AM    Troponin-I, Qt. <0.05 07/21/2019 01:27 AM         Lab Results   Component Value Date/Time    WBC 4.8 07/23/2019 06:03 AM    HGB 9.9 (L) 07/23/2019 06:03 AM    HCT 31.3 (L) 07/23/2019 06:03 AM    PLATELET 203 07/23/2019 06:03 AM    MCV 86.5 07/23/2019 06:03 AM         Lab Results   Component Value Date/Time    Sodium 140 07/24/2019 05:38 AM    Potassium 4.2 07/24/2019 05:38 AM    Chloride 110 (H) 07/24/2019 05:38 AM    CO2 26 07/24/2019 05:38 AM    Anion gap  4 (L) 07/24/2019 05:38 AM    Glucose 85 07/24/2019 05:38 AM    BUN 12  07/24/2019 05:38 AM    Creatinine 1.05 (H) 07/24/2019 05:38 AM    BUN/Creatinine ratio 11 (L) 07/24/2019 05:38 AM    GFR est AA >60 07/24/2019 05:38 AM    GFR est non-AA 56 (L) 07/24/2019 05:38 AM    Calcium 8.5 07/24/2019 05:38 AM         No results found for: BNP, BNPP, BNPPPOC, XBNPT, BNPNT      No results found for: CHOL, CHOLPOCT, CHOLX, CHLST, CHOLV, HDL, HDLPOC, HDLP, LDL, LDLCPOC, LDLC, DLDLP, VLDLC, VLDL, TGLX, TRIGL, TRIGP, TGLPOCT, CHHD, CHHDX      Lab Results   Component Value Date/Time    ALT (SGPT) 25 07/21/2019 01:27 AM    Alk. phosphatase 78 07/21/2019 01:27 AM    Bilirubin, total 0.6 07/21/2019 01:27 AM

## 2019-07-24 NOTE — Progress Notes (Signed)
Progress  Notes by Sammuel Hines, MD at 07/24/19 1709                Author: Sammuel Hines, MD  Service: Hospitalist  Author Type: Physician       Filed: 07/24/19 1718  Date of Service: 07/24/19 1709  Status: Signed          Editor: Sammuel Hines, MD (Physician)                                                Hospitalist Progress Note         Hospital summary: Anita Bell is a 48 y.o. female past medical history significant for polysubstance abuse, psychiatric disorders presented emergency room complaining on a fever.  She stated  that today she started having fevers and chills at home.  Unknown how high it was temperature at home.  She reports using cocaine and heroin around 7 PM and afterward she started feeling feverish.  She denies any symptoms such as headache, cough, shortness  of breath, chest pain, sore throat, runny nose, nausea, vomiting, diarrhea, urinary symptom.  On arrival to the emergency room patient was found to be febrile with a temperature of 100.9 lethargic but arousable.  Work-up overall unremarkable.  Given  her risk factor admission was requested.  Patient awake and alert at this time 07/21/2019         Assessment/Plan:   Fever (POA):??unknown etiology.??improved   - concern for bacteremia/endocarditis given hx IV drug use and new murmur vs viral infection.    -COVID 19 test negative   -Blood culture NGTD   - normal wbc, lactic. Elevated  pro cal 19.55    - CXR: no acute process    - abx stopped    - ID consulted, will be discharged tomorrow if afebrile        ??   ??Systolic Murmur: new finding as per pt report.    - Echocardiogram ordered   Cardiology consulted   As per cards no need for TEE unless any changes in symptoms    ??   Polysubstance abuse:   - UDS: positive for cocaine, methadone, opiates and THC   - Monitor for signs of withdrawal. Supportive care      Hx Chronic back pain    - on methadone and gabapentin       Hypokalemia - replaced       Code status: Full   DVT prophylaxis:  Lovenox    Disposition: TBD. Home likely when ready   ----------------------------------------------      CC: Fever      S: Patient seen and examined at bedside this morning . She feels better. No fevers, no acute issues overnight       Review of Systems:   A comprehensive review of systems was negative.      O:   Visit Vitals      BP  129/72 (BP 1 Location: Left arm, BP Patient Position: At rest)        Pulse  78     Temp  98.4 ??F (36.9 ??C)     Resp  16     Ht  5\' 3"  (1.6 m)     Wt  83.9 kg (185 lb)     SpO2  98%        BMI  32.77 kg/m??           PHYSICAL EXAM:   Gen: NAD   HEENT: anicteric sclerae, normal conjunctiva, oropharynx clear, MM moist   Neck: supple, trachea midline, no adenopathy   Heart: RRR   Lungs: CTA b/l, non-labored respirations   Abd: soft, NT, ND, BS+, no organomegaly   Extr: warm   Skin: dry, no rash   Neuro:  normal speech, moves all extremities            Intake/Output Summary (Last 24 hours) at 07/24/2019 1709   Last data filed at 07/23/2019 2104     Gross per 24 hour        Intake  --        Output  300 ml        Net  -300 ml            Recent labs & imaging reviewed:     Recent Results (from the past 24 hour(s))     METABOLIC PANEL, BASIC          Collection Time: 07/24/19  5:38 AM         Result  Value  Ref Range            Sodium  140  136 - 145 mmol/L       Potassium  4.2  3.5 - 5.1 mmol/L       Chloride  110 (H)  97 - 108 mmol/L       CO2  26  21 - 32 mmol/L            Anion gap  4 (L)  5 - 15 mmol/L            Glucose  85  65 - 100 mg/dL       BUN  12  6 - 20 MG/DL       Creatinine  1.05 (H)  0.55 - 1.02 MG/DL       BUN/Creatinine ratio  11 (L)  12 - 20         GFR est AA  >60  >60 ml/min/1.86m2       GFR est non-AA  56 (L)  >60 ml/min/1.67m2       Calcium  8.5  8.5 - 10.1 MG/DL       MAGNESIUM          Collection Time: 07/24/19  5:38 AM         Result  Value  Ref Range            Magnesium  2.0  1.6 - 2.4 mg/dL       CBC WITH AUTOMATED DIFF          Collection Time: 07/24/19 11:50 AM          Result  Value  Ref Range            WBC  5.7  3.6 - 11.0 K/uL       RBC  3.86  3.80 - 5.20 M/uL       HGB  10.5 (L)  11.5 - 16.0 g/dL       HCT  33.4 (L)  35.0 - 47.0 %       MCV  86.5  80.0 - 99.0 FL       MCH  27.2  26.0 - 34.0 PG       MCHC  31.4  30.0 - 36.5 g/dL  RDW  15.6 (H)  11.5 - 14.5 %       PLATELET  236  150 - 400 K/uL       MPV  11.2  8.9 - 12.9 FL       NRBC  0.0  0 PER 100 WBC       ABSOLUTE NRBC  0.00  0.00 - 0.01 K/uL       NEUTROPHILS  32  32 - 75 %       LYMPHOCYTES  51 (H)  12 - 49 %       MONOCYTES  8  5 - 13 %       EOSINOPHILS  9 (H)  0 - 7 %       BASOPHILS  0  0 - 1 %       IMMATURE GRANULOCYTES  0  0.0 - 0.5 %       ABS. NEUTROPHILS  1.8  1.8 - 8.0 K/UL       ABS. LYMPHOCYTES  2.9  0.8 - 3.5 K/UL       ABS. MONOCYTES  0.5  0.0 - 1.0 K/UL       ABS. EOSINOPHILS  0.5 (H)  0.0 - 0.4 K/UL       ABS. BASOPHILS  0.0  0.0 - 0.1 K/UL       ABS. IMM. GRANS.  0.0  0.00 - 0.04 K/UL            DF  AUTOMATED             Recent Labs            07/24/19   1150  07/23/19   0603     WBC  5.7  4.8     HGB  10.5*  9.9*     HCT  33.4*  31.3*         PLT  236  203          Recent Labs             07/24/19   0538  07/23/19   0603  07/22/19   0630     NA  140  143  142     K  4.2  4.0  3.2*     CL  110*  111*  111*     CO2  26  26  25      BUN  12  11  8      CREA  1.05*  0.97  1.01     GLU  85  97  80     CA  8.5  8.8  8.5          MG  2.0   --    --         No results for input(s): ALT, AP, TBIL, TBILI, TP, ALB, GLOB, GGT, AML, LPSE in the last 72 hours.      No lab exists for component: SGOT, GPT, AMYP, HLPSE     Recent Labs           07/22/19   0630     INR  1.1     PTP  11.3*        APTT  28.2         No results for input(s): FE, TIBC, PSAT, FERR in the last 72 hours.      Lab Results         Component  Value  Date/Time            Folate  5.4  03/14/2019 05:47 AM         No results for input(s): PH, PCO2, PO2 in the last 72 hours.   No results for input(s): CPK, CKNDX, TROIQ in the last 72  hours.      No lab exists for component: CPKMB   No results found for: CHOL, CHOLX, CHLST, CHOLV, HDL, HDLP, LDL, LDLC, DLDLP, TGLX, TRIGL, TRIGP, CHHD, CHHDX     Lab Results         Component  Value  Date/Time            Glucose (POC)  144 (H)  03/14/2019 09:56 AM          Lab Results         Component  Value  Date/Time            Color  YELLOW/STRAW  07/21/2019 03:44 AM       Appearance  CLEAR  07/21/2019 03:44 AM       Specific gravity  1.008  07/21/2019 03:44 AM       Specific gravity  1.029  09/14/2009 08:45 PM       pH (UA)  7.0  07/21/2019 03:44 AM       Protein  Negative  07/21/2019 03:44 AM       Glucose  Negative  07/21/2019 03:44 AM       Ketone  Negative  07/21/2019 03:44 AM       Bilirubin  Negative  07/21/2019 03:44 AM       Urobilinogen  1.0  07/21/2019 03:44 AM       Nitrites  Negative  07/21/2019 03:44 AM       Leukocyte Esterase  TRACE (A)  07/21/2019 03:44 AM       Epithelial cells  FEW  07/21/2019 03:44 AM       Bacteria  Negative  07/21/2019 03:44 AM       WBC  0-4  07/21/2019 03:44 AM            RBC  0-5  07/21/2019 03:44 AM           Med list reviewed     Current Facility-Administered Medications          Medication  Dose  Route  Frequency           ?  hydrocortisone (CORTAID) 1 % cream     Topical  PRN     ?  influenza vaccine 2020-21 (6 mos+)(PF) (FLUARIX/FLULAVAL/FLUZONE QUAD) injection 0.5 mL   0.5 mL  IntraMUSCular  PRIOR TO DISCHARGE     ?  diphenhydrAMINE (BENADRYL) capsule 25 mg   25 mg  Oral  Q6H PRN     ?  ondansetron (ZOFRAN) injection 4 mg   4 mg  IntraVENous  Q6H PRN     ?  methadone (DOLOPHINE) 5 mg/5 mL oral solution 85 mg   85 mg  Oral  DAILY     ?  sodium chloride (NS) flush 5-40 mL   5-40 mL  IntraVENous  Q8H     ?  sodium chloride (NS) flush 5-40 mL   5-40 mL  IntraVENous  PRN     ?  acetaminophen (TYLENOL) tablet 650 mg   650 mg  Oral  Q6H PRN          Or           ?  acetaminophen (TYLENOL) suppository 650 mg   650 mg  Rectal  Q6H PRN     ?  polyethylene glycol (MIRALAX)  packet 17 g   17 g  Oral  DAILY PRN     ?  enoxaparin (LOVENOX) injection 40 mg   40 mg  SubCUTAneous  DAILY     ?  gabapentin (NEURONTIN) capsule 800 mg   800 mg  Oral  TID     ?  meloxicam (MOBIC) tablet 7.5 mg   7.5 mg  Oral  DAILY           ?  mirtazapine (REMERON) tablet 15 mg   15 mg  Oral  QHS           Care Plan discussed with:   Patient/Family and Nurse      Sammuel Hines, MD   Internal Medicine   Date of Service: 07/24/2019

## 2019-07-24 NOTE — Progress Notes (Signed)
Problem: Risk for Spread of Infection  Goal: Prevent transmission of infectious organism to others  Description: Prevent the transmission of infectious organisms to other patients, staff members, and visitors.  Outcome: Progressing Towards Goal

## 2019-07-24 NOTE — Progress Notes (Signed)
Bedside shift change report given to sheetal (oncoming nurse) by Morrie Sheldon (offgoing nurse). Report included the following information SBAR, Kardex and Intake/Output.

## 2019-07-24 NOTE — Progress Notes (Signed)
Bedside and Verbal shift change report given to janet (Soil scientist) by Imagene Gurney (offgoing nurse). Report included the following information SBAR, Kardex, MAR and Recent Results.

## 2019-07-24 NOTE — Progress Notes (Signed)
ID Progress Note  07/24/2019    Subjective:     Afebrile. No dyspnea, abdominal pain, headache or sore throat. Her chronic back pain is better.     ROS: No anaphylaxis, seizures, syncope, hematemesis, hematochezia     Objective:     Vitals:   Visit Vitals  BP 129/79 (BP 1 Location: Left arm, BP Patient Position: At rest)   Pulse 60   Temp 98.8 ??F (37.1 ??C)   Resp 16   Ht 5' 3"  (1.6 m)   Wt 83.9 kg (185 lb)   SpO2 100%   BMI 32.77 kg/m??        Tmax:  Temp (24hrs), Avg:98.6 ??F (37 ??C), Min:98.4 ??F (36.9 ??C), Max:98.8 ??F (37.1 ??C)      Exam:    Not in distress  Pink conjunctivae, anicteric sclerae  No cervical lymphdenopathy   Lung clear, no rales, wheezes or rhonchi   Heart: s1, s2, RRR, no murmurs rubs or clicks  Abdomen: soft nontender, no guarding or rebound  Knees not warm or tender  Speech fluent   No embolic lesions on the fingernails.     Labs:   Lab Results   Component Value Date/Time    WBC 4.8 07/23/2019 06:03 AM    HGB 9.9 (L) 07/23/2019 06:03 AM    HCT 31.3 (L) 07/23/2019 06:03 AM    PLATELET 203 07/23/2019 06:03 AM    MCV 86.5 07/23/2019 06:03 AM     Lab Results   Component Value Date/Time    Sodium 140 07/24/2019 05:38 AM    Potassium 4.2 07/24/2019 05:38 AM    Chloride 110 (H) 07/24/2019 05:38 AM    CO2 26 07/24/2019 05:38 AM    Anion gap 4 (L) 07/24/2019 05:38 AM    Glucose 85 07/24/2019 05:38 AM    BUN 12 07/24/2019 05:38 AM    Creatinine 1.05 (H) 07/24/2019 05:38 AM    BUN/Creatinine ratio 11 (L) 07/24/2019 05:38 AM    GFR est AA >60 07/24/2019 05:38 AM    GFR est non-AA 56 (L) 07/24/2019 05:38 AM    Calcium 8.5 07/24/2019 05:38 AM    Bilirubin, total 0.6 07/21/2019 01:27 AM    Alk. phosphatase 78 07/21/2019 01:27 AM    Protein, total 8.0 07/21/2019 01:27 AM    Albumin 3.6 07/21/2019 01:27 AM    Globulin 4.4 (H) 07/21/2019 01:27 AM    A-G Ratio 0.8 (L) 07/21/2019 01:27 AM    ALT (SGPT) 25 07/21/2019 01:27 AM             Assessment:     #1 fever - resolved   ??  #2 polysubstance abuse  ??   #3 chronic back pain  ??    Recommendations:       ??  Her blood cultures are negative.  This would normally not meet the diagnostic criteria for endocarditis.  Although culture-negative endocarditis is possible, I do not think she would have defervesced so quickly.      Monitor off abx and observe her fever curve.  If she spikes fever again, I would recommend proceeding with TEE and getting an MRI of the lumbar spine. She tells me that her chronic back pain has been present for 16 years and has not worsened. In fact, it is better today.     If she afebrile by tomorrow morning, she can be discharged.         Yeray Tomas Sallyanne Havers, MD

## 2019-07-24 NOTE — Progress Notes (Signed)
Bedside shift change report given to sheetal (oncoming nurse) by Caryl Pina (offgoing nurse). Report included the following information SBAR, Kardex and Intake/Output.

## 2019-07-24 NOTE — Progress Notes (Signed)
Bedside shift change report given to [place], RN (Soil scientist) by Caryl Pina, RN (offgoing nurse). Report included the following information SBAR, Kardex, Intake/Output, MAR and Recent Results.

## 2019-07-25 MED FILL — METHADONE 1 MG/ML ORAL SOLN: 5 mg/ mL | ORAL | Qty: 85

## 2019-07-25 MED FILL — MIRTAZAPINE 15 MG TAB: 15 mg | ORAL | Qty: 1

## 2019-07-25 MED FILL — ENOXAPARIN 40 MG/0.4 ML SUB-Q SYRINGE: 40 mg/0.4 mL | SUBCUTANEOUS | Qty: 0.4

## 2019-07-25 MED FILL — GABAPENTIN 400 MG CAP: 400 mg | ORAL | Qty: 2

## 2019-07-25 MED FILL — MELOXICAM 7.5 MG TAB: 7.5 mg | ORAL | Qty: 1

## 2019-07-25 NOTE — Progress Notes (Signed)
Problem: Risk for Spread of Infection  Goal: Prevent transmission of infectious organism to others  Description: Prevent the transmission of infectious organisms to other patients, staff members, and visitors.  Outcome: Progressing Towards Goal

## 2019-07-25 NOTE — Discharge Summary (Signed)
Discharge Summary by Sammuel Hines, MD at 07/25/19 [phone]                Author: Sammuel Hines, MD  Service: Hospitalist  Author Type: Physician       Filed: 07/25/19 1010  Date of Service: 07/25/19 0956  Status: Signed          Editor: Sammuel Hines, MD (Physician)                                Inpatient hospitalist discharge summary                  Brief Overview      PATIENT ID: Anita Bell      MRN: [id number]       DATE OF BIRTH: 03/29/1971      Admitting Provider: Revonda Standard, MD      Discharging Provider: Sammuel Hines, MD    To contact this individual call [phone] and ask the operator to page.  If unavailable ask to be transferred the Adult Hospitalist Department.         PCP at discharge: None None    None (395) Patient stated that they have no PCP      Admission date: 07/21/2019  Date of Discharge: 07/25/19      Chief complaint:      Chief Complaint       Patient presents with        ?  Fever        ?  Generalized Body Aches          Patient Active Problem List        Diagnosis  Code         ?  Extrinsic asthma, unspecified  J45.909     ?  Myalgia and myositis, unspecified  IMO0001     ?  ADHD (attention deficit hyperactivity disorder)  F90.9         ?  Fever  R50.9              Discharge diagnosis, hospital course/plan:   As per initial admission summary   Anita Bell??is a 48 y.o.??female??past medical history significant for polysubstance abuse, psychiatric disorders presented emergency room complaining on a fever. ??She stated that today she started  having fevers and chills at home. ??Unknown how high it was temperature at home. ??She reports using cocaine and heroin around 7 PM and afterward she started feeling feverish. ??She denies any symptoms such as headache, cough, shortness of breath,  chest pain, sore throat, runny nose, nausea, vomiting, diarrhea, urinary symptom. ??On arrival to the emergency room patient was found to be febrile with a temperature of 100.9 lethargic but arousable.  ??Work-up overall unremarkable. ??Given her  risk factor admission was requested. ??Patient awake and alert at this time 07/21/2019      Fever (POA):??unknown etiology.??resolved    - concern for bacteremia/endocarditis given hx IV drug use and new murmur vs viral infection.   Cardiology and ID consulted. ??   -COVID 19 test negative   -Blood culture NGTD   - CXR: no acute process    -??abx stopped    - ID consulted, patient was monitored off antibiotics. Patient will be discharged today as no signs of infection and patient si afebrile. She wants to go home as well   I explained to the patient that in case of any worsening symptoms or  new fever spikes she needs to come to the Ed.   I also advised her not to do any drugs including IV drug as she is at risk of getting severe complications and risk of death. She understood and agreed with plan    ??   ??Systolic Murmur:    - Estimated LVEF is 60 - 65%. Visually measured ejection fraction. Normal cavity size, wall thickness and systolic function (ejection fraction normal   Cardiology consulted   As per cards no need for TEE unless any changes in symptoms    ??   Polysubstance abuse:   - UDS: positive for cocaine, methadone, opiates and THC   - Monitor for signs of withdrawal. Supportive care   ??   Hx Chronic back pain    - on methadone and gabapentin    ??   Hypokalemia - replaced    ??      On the date of discharge, diagnostic face to face encounter was performed.   Patient was hemodynamically stable, offering no new complaints. Denies any shortness of breath at rest, no fevers or chills, no diarrhea or constipation. Patient is agreeable for discharge. Patient understood and verbalized the understanding of the discharge  plan.      Patient was advised to seek medical help/ care or return to ED, if symptoms recur, worsen or new symptoms develop.         Discharge Disposition:   [facility]      Discharge activity:   Activity as tolerated      Code status at discharge:    Full Code       Outpatient follow up:   Please follow up with your PCP in one week         Future appointments-   No future appointments.     Follow-up Information               Follow up With  Specialties  Details  Why  Contact Info              follow up with your PCP    In 1 week                  None        None (395) Patient stated that they have no PCP                   Operative procedures performed:         Consults:  IP CONSULT TO INFECTIOUS DISEASES   IP CONSULT TO CARDIOLOGY      Procedures:   * No surgery found *      Diet:  DIET REGULAR   DIET ONE TIME MESSAGE   DIET ONE TIME MESSAGE      Pertinent test results:   Xr Chest Port      Result Date: 07/21/2019   INDICATION: Fever, eval for pna EXAM:  AP CHEST RADIOGRAPH COMPARISON: June 11, 2012 FINDINGS: AP portable view of the chest demonstrates a normal cardiomediastinal silhouette. There is no edema, effusion, consolidation, or pneumothorax. The osseous  structures are unremarkable.       IMPRESSION: No acute process.           Recent Results (from the past 168 hour(s))     CBC WITH AUTOMATED DIFF          Collection Time: 07/21/19  1:27 AM  Result  Value  Ref Range            WBC  9.0  3.6 - 11.0 K/uL       RBC  3.95  3.80 - 5.20 M/uL       HGB  10.8 (L)  11.5 - 16.0 g/dL       HCT  34.2 (L)  35.0 - 47.0 %       MCV  86.6  80.0 - 99.0 FL       MCH  27.3  26.0 - 34.0 PG       MCHC  31.6  30.0 - 36.5 g/dL       RDW  14.9 (H)  11.5 - 14.5 %       PLATELET  220  150 - 400 K/uL       MPV  11.3  8.9 - 12.9 FL       NRBC  0.0  0 PER 100 WBC       ABSOLUTE NRBC  0.00  0.00 - 0.01 K/uL       NEUTROPHILS  85 (H)  32 - 75 %       BAND NEUTROPHILS  6  0 - 6 %       LYMPHOCYTES  7 (L)  12 - 49 %       MONOCYTES  2 (L)  5 - 13 %       EOSINOPHILS  0  0 - 7 %       BASOPHILS  0  0 - 1 %       IMMATURE GRANULOCYTES  0  %       ABS. NEUTROPHILS  8.2 (H)  1.8 - 8.0 K/UL       ABS. LYMPHOCYTES  0.6 (L)  0.8 - 3.5 K/UL       ABS. MONOCYTES  0.2  0.0 - 1.0 K/UL        ABS. EOSINOPHILS  0.0  0.0 - 0.4 K/UL       ABS. BASOPHILS  0.0  0.0 - 0.1 K/UL       ABS. IMM. GRANS.  0.0  K/UL       DF  MANUAL          PLATELET COMMENTS  Large Platelets          RBC COMMENTS  ANISOCYTOSIS   1+             RBC COMMENTS  VACUOLATED POLYS   OVALOCYTES   1+             METABOLIC PANEL, COMPREHENSIVE          Collection Time: 07/21/19  1:27 AM         Result  Value  Ref Range            Sodium  138  136 - 145 mmol/L       Potassium  3.2 (L)  3.5 - 5.1 mmol/L       Chloride  105  97 - 108 mmol/L       CO2  25  21 - 32 mmol/L       Anion gap  8  5 - 15 mmol/L       Glucose  114 (H)  65 - 100 mg/dL       BUN  9  6 - 20 MG/DL       Creatinine  1.06 (H)  0.55 - 1.02  MG/DL       BUN/Creatinine ratio  8 (L)  12 - 20         GFR est AA  >60  >60 ml/min/1.72m       GFR est non-AA  56 (L)  >60 ml/min/1.783m      Calcium  9.1  8.5 - 10.1 MG/DL       Bilirubin, total  0.6  0.2 - 1.0 MG/DL       ALT (SGPT)  25  12 - 78 U/L       AST (SGOT)  20  15 - 37 U/L       Alk. phosphatase  78  45 - 117 U/L       Protein, total  8.0  6.4 - 8.2 g/dL       Albumin  3.6  3.5 - 5.0 g/dL       Globulin  4.4 (H)  2.0 - 4.0 g/dL       A-G Ratio  0.8 (L)  1.1 - 2.2         CK          Collection Time: 07/21/19  1:27 AM         Result  Value  Ref Range            CK  125  26 - 192 U/L       TROPONIN I          Collection Time: 07/21/19  1:27 AM         Result  Value  Ref Range            Troponin-I, Qt.  <0.05  <0.05 ng/mL       ETHYL ALCOHOL          Collection Time: 07/21/19  1:50 AM         Result  Value  Ref Range            ALCOHOL(ETHYL),SERUM  <10  <10 MG/DL       EKG, 12 LEAD, INITIAL          Collection Time: 07/21/19  2:18 AM         Result  Value  Ref Range            Ventricular Rate  67  BPM       Atrial Rate  67  BPM       P-R Interval  166  ms       QRS Duration  82  ms       Q-T Interval  424  ms       QTC Calculation (Bezet)  448  ms       Calculated P Axis  59  degrees       Calculated R Axis  5  degrees        Calculated T Axis  0  degrees       Diagnosis                 Normal sinus rhythm   Nonspecific T wave abnormality   No previous ECGs available   Confirmed by CrLarey SeatM.D., [name] ([id number] 07/21/2019 11:17:15 AM          URINALYSIS W/MICROSCOPIC          Collection Time: 07/21/19  3:44 AM         Result  Value  Ref Range  Color  YELLOW/STRAW          Appearance  CLEAR  CLEAR         Specific gravity  1.008  1.003 - 1.030         pH (UA)  7.0  5.0 - 8.0         Protein  Negative  NEG mg/dL       Glucose  Negative  NEG mg/dL       Ketone  Negative  NEG mg/dL       Bilirubin  Negative  NEG         Blood  TRACE (A)  NEG         Urobilinogen  1.0  0.2 - 1.0 EU/dL       Nitrites  Negative  NEG         Leukocyte Esterase  TRACE (A)  NEG         WBC  0-4  0 - 4 /hpf       RBC  0-5  0 - 5 /hpf       Epithelial cells  FEW  FEW /lpf       Bacteria  Negative  NEG /hpf       URINE CULTURE HOLD SAMPLE          Collection Time: 07/21/19  3:44 AM       Specimen: Serum; Urine         Result  Value  Ref Range            Urine culture hold                  Urine on hold in Microbiology dept for 2 days.  If unpreserved urine is submitted, it cannot be used for addtional testing after 24 hours, recollection  will be required.       DRUG SCREEN, URINE          Collection Time: 07/21/19  3:44 AM         Result  Value  Ref Range            AMPHETAMINES  Negative  NEG         BARBITURATES  Negative  NEG         BENZODIAZEPINES  Negative  NEG         COCAINE  Positive (A)  NEG         METHADONE  Positive (A)  NEG         OPIATES  Positive (A)  NEG         PCP(PHENCYCLIDINE)  Negative  NEG         THC (TH-CANNABINOL)  Positive (A)  NEG         Drug screen comment  (NOTE)         LACTIC ACID          Collection Time: 07/21/19  4:22 AM         Result  Value  Ref Range            Lactic acid  1.1  0.4 - 2.0 MMOL/L       CULTURE, BLOOD, PAIRED          Collection Time: 07/21/19  4:22 AM       Specimen: Blood         Result  Value  Ref  Range            Special Requests:  NO SPECIAL REQUESTS          Culture result:  NO GROWTH 4 DAYS          PROTHROMBIN TIME + INR          Collection Time: 07/21/19  6:19 AM         Result  Value  Ref Range            INR  1.1  0.9 - 1.1         Prothrombin time  11.5 (H)  9.0 - 11.1 sec       PTT          Collection Time: 07/21/19  6:19 AM         Result  Value  Ref Range            aPTT  26.8  22.1 - 32.0 sec       aPTT, therapeutic range       58.0 - 77.0 SECS       FIBRINOGEN          Collection Time: 07/21/19  6:19 AM         Result  Value  Ref Range            Fibrinogen  254  200 - 475 mg/dL       LD          Collection Time: 07/21/19  6:19 AM         Result  Value  Ref Range            LD  206  81 - 246 U/L       FERRITIN          Collection Time: 07/21/19  6:19 AM         Result  Value  Ref Range            Ferritin  49  26 - 388 NG/ML       D DIMER          Collection Time: 07/21/19  6:19 AM         Result  Value  Ref Range            D-dimer  1.86 (H)  0.00 - 0.65 mg/L FEU       PROCALCITONIN          Collection Time: 07/21/19  6:19 AM         Result  Value  Ref Range            Procalcitonin  19.55  ng/mL       SARS-COV-2          Collection Time: 07/21/19  6:19 AM         Result  Value  Ref Range            Specimen source  Nasopharyngeal          SARS-CoV-2  Not Detected  Not Detected         Specimen source  Nasopharyngeal          Specimen type  NP Swab          Health status  Symptomatic Testing          SAMPLES BEING HELD          Collection Time: 07/21/19  6:24 AM         Result  Value  Ref Range  SAMPLES BEING HELD  1LAV         COMMENT                  Add-on orders for these samples will be processed based on acceptable specimen integrity and analyte stability, which may vary by analyte.       PROTHROMBIN TIME + INR          Collection Time: 07/22/19  6:30 AM         Result  Value  Ref Range            INR  1.1  0.9 - 1.1         Prothrombin time  11.3 (H)  9.0 - 11.1 sec       PTT           Collection Time: 07/22/19  6:30 AM         Result  Value  Ref Range            aPTT  28.2  22.1 - 32.0 sec       aPTT, therapeutic range       58.0 - 77.0 SECS       FIBRINOGEN          Collection Time: 07/22/19  6:30 AM         Result  Value  Ref Range            Fibrinogen  276  200 - 475 mg/dL       D DIMER          Collection Time: 07/22/19  6:30 AM         Result  Value  Ref Range            D-dimer  1.54 (H)  0.00 - 0.65 mg/L FEU       METABOLIC PANEL, BASIC          Collection Time: 07/22/19  6:30 AM         Result  Value  Ref Range            Sodium  142  136 - 145 mmol/L       Potassium  3.2 (L)  3.5 - 5.1 mmol/L       Chloride  111 (H)  97 - 108 mmol/L       CO2  25  21 - 32 mmol/L       Anion gap  6  5 - 15 mmol/L       Glucose  80  65 - 100 mg/dL       BUN  8  6 - 20 MG/DL       Creatinine  1.01  0.55 - 1.02 MG/DL       BUN/Creatinine ratio  8 (L)  12 - 20         GFR est AA  >60  >60 ml/min/1.78m       GFR est non-AA  59 (L)  >60 ml/min/1.759m      Calcium  8.5  8.5 - 10.1 MG/DL       CBC WITH AUTOMATED DIFF          Collection Time: 07/22/19  6:30 AM         Result  Value  Ref Range            WBC  5.7  3.6 - 11.0 K/uL       RBC  3.73 (  L)  3.80 - 5.20 M/uL       HGB  10.1 (L)  11.5 - 16.0 g/dL       HCT  32.2 (L)  35.0 - 47.0 %       MCV  86.3  80.0 - 99.0 FL       MCH  27.1  26.0 - 34.0 PG       MCHC  31.4  30.0 - 36.5 g/dL       RDW  15.4 (H)  11.5 - 14.5 %       PLATELET  204  150 - 400 K/uL       MPV  11.9  8.9 - 12.9 FL       NRBC  0.0  0 PER 100 WBC       ABSOLUTE NRBC  0.00  0.00 - 0.01 K/uL       NEUTROPHILS  52  32 - 75 %       LYMPHOCYTES  37  12 - 49 %       MONOCYTES  7  5 - 13 %       EOSINOPHILS  4  0 - 7 %       BASOPHILS  0  0 - 1 %       IMMATURE GRANULOCYTES  0  0.0 - 0.5 %       ABS. NEUTROPHILS  3.0  1.8 - 8.0 K/UL       ABS. LYMPHOCYTES  2.1  0.8 - 3.5 K/UL       ABS. MONOCYTES  0.4  0.0 - 1.0 K/UL       ABS. EOSINOPHILS  0.2  0.0 - 0.4 K/UL       ABS. BASOPHILS  0.0  0.0  - 0.1 K/UL       ABS. IMM. GRANS.  0.0  0.00 - 0.04 K/UL       DF  AUTOMATED          METABOLIC PANEL, BASIC          Collection Time: 07/23/19  6:03 AM         Result  Value  Ref Range            Sodium  143  136 - 145 mmol/L       Potassium  4.0  3.5 - 5.1 mmol/L       Chloride  111 (H)  97 - 108 mmol/L       CO2  26  21 - 32 mmol/L       Anion gap  6  5 - 15 mmol/L       Glucose  97  65 - 100 mg/dL       BUN  11  6 - 20 MG/DL       Creatinine  0.97  0.55 - 1.02 MG/DL       BUN/Creatinine ratio  11 (L)  12 - 20         GFR est AA  >60  >60 ml/min/1.16m       GFR est non-AA  >60  >60 ml/min/1.735m      Calcium  8.8  8.5 - 10.1 MG/DL       CBC WITH AUTOMATED DIFF          Collection Time: 07/23/19  6:03 AM         Result  Value  Ref Range  WBC  4.8  3.6 - 11.0 K/uL       RBC  3.62 (L)  3.80 - 5.20 M/uL       HGB  9.9 (L)  11.5 - 16.0 g/dL       HCT  31.3 (L)  35.0 - 47.0 %       MCV  86.5  80.0 - 99.0 FL       MCH  27.3  26.0 - 34.0 PG       MCHC  31.6  30.0 - 36.5 g/dL       RDW  15.7 (H)  11.5 - 14.5 %       PLATELET  203  150 - 400 K/uL       MPV  11.2  8.9 - 12.9 FL       NRBC  0.0  0 PER 100 WBC       ABSOLUTE NRBC  0.00  0.00 - 0.01 K/uL       NEUTROPHILS  26 (L)  32 - 75 %       LYMPHOCYTES  55 (H)  12 - 49 %       MONOCYTES  11  5 - 13 %       EOSINOPHILS  8 (H)  0 - 7 %       BASOPHILS  0  0 - 1 %       IMMATURE GRANULOCYTES  0  0.0 - 0.5 %       ABS. NEUTROPHILS  1.3 (L)  1.8 - 8.0 K/UL       ABS. LYMPHOCYTES  2.6  0.8 - 3.5 K/UL       ABS. MONOCYTES  0.5  0.0 - 1.0 K/UL       ABS. EOSINOPHILS  0.4  0.0 - 0.4 K/UL       ABS. BASOPHILS  0.0  0.0 - 0.1 K/UL       ABS. IMM. GRANS.  0.0  0.00 - 0.04 K/UL       DF  AUTOMATED          ECHO ADULT COMPLETE          Collection Time: 07/23/19  9:22 AM         Result  Value  Ref Range            IVSd  0.69  0.6 - 0.9 cm       LVIDd  4.72  3.9 - 5.3 cm       LVIDs  2.95  cm       LVOT d  1.88  cm       LVPWd  0.87  0.6 - 0.9 cm       LVOT Peak Gradient   5.36  mmHg       LVOT SV  62.8  mL       LVOT Peak Velocity  115.80  cm/s       LVOT VTI  22.66  cm       RVIDd  3.83  cm       RVSP  32.14  mmHg       RVSP  30.15  mmHg       RVSP  32.02  mmHg       RVSP  28.15  mmHg       RVSP  32.14  mmHg       Left Atrium Major Axis  3.31  cm  LA Volume  57.21  22 - 52 mL       LA Area 4C  19.09  cm2       LA Vol 2C  56.44 (A)  22 - 52 mL       LA Vol 4C  49.34  22 - 52 mL       Est. RA Pressure  3.00  mmHg       Aortic Valve Area by Continuity of Peak Velocity  1.99  cm2       Aortic Valve Area by Continuity of Peak Velocity  1.99  cm2       Aortic Valve Area by Continuity of VTI  1.91  cm2       Aortic Valve Area by Continuity of VTI  1.91  cm2       AoV PG  10.39  mmHg       Aortic Valve Systolic Mean Gradient  6.38  mmHg       Aortic Valve Systolic Peak Velocity  756.43  cm/s       AoV VTI  32.90  cm       MV A Vel  54.66  cm/s       Mitral Valve E Wave Deceleration Time  0.16  s       MV E Vel  96.30  cm/s       MV E/A  1.76         E/E' ratio (averaged)  7.31         E/E' lateral  5.38         E/E' septal  9.24         LV E' Lateral Velocity  17.91  cm/s       LV E' Septal Velocity  10.42  cm/s       Mitral Valve Pressure Half-time  0.05  s       MVA (PHT)  4.63  cm2       Pulmonic Valve Systolic Peak Instantaneous Gradient  5.16  mmHg       Pulmonic Regurgitant End Max Velocity  113.58  cm/s       Pulmonic Valve Systolic Peak Instantaneous Gradient  3.88  mmHg       Pulmonic Valve Max Velocity  98.50  cm/s       Tapse  2.27 (A)  1.5 - 2.0 cm       Triscuspid Valve Regurgitation Peak Gradient  29.14  mmHg       Triscuspid Valve Regurgitation Peak Gradient  27.15  mmHg       Triscuspid Valve Regurgitation Peak Gradient  29.02  mmHg       Triscuspid Valve Regurgitation Peak Gradient  25.15  mmHg       Triscuspid Valve Regurgitation Peak Gradient  29.14  mmHg       TR Max Velocity  269.90  cm/s       TR Max Velocity  260.54  cm/s       TR Max Velocity  269.33  cm/s        TR Max Velocity  250.74  cm/s       TR Max Velocity  269.90  cm/s       Ao Root D  2.71  cm       LV Mass AL  119.2  67 - 162 g       LV Mass AL Index  63.7  43 - 95 g/m2  Left Atrium Minor Axis  1.77  cm       LA Vol Index  30.58  16 - 28 ml/m2       LA Vol Index  30.17  16 - 28 ml/m2       LA Vol Index  26.37  16 - 28 ml/m2       METABOLIC PANEL, BASIC          Collection Time: 07/24/19  5:38 AM         Result  Value  Ref Range            Sodium  140  136 - 145 mmol/L       Potassium  4.2  3.5 - 5.1 mmol/L       Chloride  110 (H)  97 - 108 mmol/L       CO2  26  21 - 32 mmol/L       Anion gap  4 (L)  5 - 15 mmol/L       Glucose  85  65 - 100 mg/dL       BUN  12  6 - 20 MG/DL       Creatinine  1.05 (H)  0.55 - 1.02 MG/DL       BUN/Creatinine ratio  11 (L)  12 - 20         GFR est AA  >60  >60 ml/min/1.64m       GFR est non-AA  56 (L)  >60 ml/min/1.732m      Calcium  8.5  8.5 - 10.1 MG/DL       MAGNESIUM          Collection Time: 07/24/19  5:38 AM         Result  Value  Ref Range            Magnesium  2.0  1.6 - 2.4 mg/dL       CBC WITH AUTOMATED DIFF          Collection Time: 07/24/19 11:50 AM         Result  Value  Ref Range            WBC  5.7  3.6 - 11.0 K/uL       RBC  3.86  3.80 - 5.20 M/uL       HGB  10.5 (L)  11.5 - 16.0 g/dL       HCT  33.4 (L)  35.0 - 47.0 %       MCV  86.5  80.0 - 99.0 FL       MCH  27.2  26.0 - 34.0 PG       MCHC  31.4  30.0 - 36.5 g/dL       RDW  15.6 (H)  11.5 - 14.5 %       PLATELET  236  150 - 400 K/uL       MPV  11.2  8.9 - 12.9 FL       NRBC  0.0  0 PER 100 WBC       ABSOLUTE NRBC  0.00  0.00 - 0.01 K/uL       NEUTROPHILS  32  32 - 75 %       LYMPHOCYTES  51 (H)  12 - 49 %       MONOCYTES  8  5 - 13 %       EOSINOPHILS  9 (H)  0 -  7 %       BASOPHILS  0  0 - 1 %       IMMATURE GRANULOCYTES  0  0.0 - 0.5 %       ABS. NEUTROPHILS  1.8  1.8 - 8.0 K/UL       ABS. LYMPHOCYTES  2.9  0.8 - 3.5 K/UL       ABS. MONOCYTES  0.5  0.0 - 1.0 K/UL       ABS. EOSINOPHILS  0.5 (H)  0.0 - 0.4  K/UL       ABS. BASOPHILS  0.0  0.0 - 0.1 K/UL       ABS. IMM. GRANS.  0.0  0.00 - 0.04 K/UL            DF  AUTOMATED                    Physical Exam on Discharge:      Discharge condition: good      Vital signs:    Patient Vitals for the past 12 hrs:            Temp  Pulse  Resp  BP  SpO2            07/25/19 0833  98.6 ??F (37 ??C)  (!) 57  16  120/62  100 %            07/25/19 0425  98.5 ??F (36.9 ??C)  61  16  102/66  97 %            07/24/19 2304  98.4 ??F (36.9 ??C)  65  16  (!) 145/87  100 %           Visit Vitals      BP  120/62 (BP 1 Location: Left arm, BP Patient Position: At rest)     Pulse  (!) 57     Temp  98.6 ??F (37 ??C)     Resp  16     Ht  '5\' 3"'$  (1.6 m)     Wt  83.9 kg (185 lb)     SpO2  100%        BMI  32.77 kg/m??           General:   Alert, cooperative, no distress, appears stated age.     Head:   Normocephalic, without obvious abnormality, atraumatic.     Lungs:    Clear to auscultation bilaterally.        Chest wall:   No tenderness or deformity.        Heart:   Regular rate and rhythm, S1, S2 normal, no murmur, click, rub or gallop.        Abdomen:    Soft, non-tender. Bowel sounds normal. No masses,  No organomegaly.                                     Current Discharge Medication List              CONTINUE these medications which have NOT CHANGED          Details        methadone (DOLOPHINE) 10 mg/mL solution  Take 85 mg by mouth daily. Indications: symptoms from stopping treatment with opioid drugs               rizatriptan (MAXALT-MLT) 10 mg  disintegrating tablet                 topiramate (TOPAMAX) 50 mg tablet                 buprenorphine-naloxone (SUBOXONE) 8-2 mg film sublingaul film  1 Film by SubLINGual route three (3) times daily.          Comments: .               DULoxetine (CYMBALTA) 60 mg capsule  Take 60 mg by mouth daily.               famotidine (PEPCID) 20 mg tablet  Take 20 mg by mouth two (2) times a day.               gabapentin (NEURONTIN) 800 mg tablet  Take 800 mg by mouth  three (3) times daily.               mirtazapine (REMERON SOL-TAB) 15 mg disintegrating tablet  Take 15 mg by mouth nightly.               risperiDONE (RisperDAL) 1 mg tablet  Take 1 mg by mouth daily.                     STOP taking these medications                  meloxicam (MOBIC) 7.5 mg tablet  Comments:    Reason for Stopping:                      naproxen (NAPROSYN) 375 mg tablet  Comments:    Reason for Stopping:                                Total time spent on discharge planning, counseling and co-ordination of care:    35 minutes      Sammuel Hines, MD   07/25/19   9:56 AM

## 2019-07-25 NOTE — Progress Notes (Signed)
Discussed with patient insertion of new IV, patient taking oral medications and plans to be discharged in AM if remains afebrile. Patient prefers not to have IV at this time.

## 2019-07-25 NOTE — Progress Notes (Signed)
 Bedside shift change report given to Casimer Bilis RN (oncoming nurse) by Marylu Lund RN (offgoing nurse). Report included the following information SBAR, Kardex and MAR.

## 2019-07-25 NOTE — Progress Notes (Signed)
Problem: Risk for Spread of Infection  Goal: Prevent transmission of infectious organism to others  Description: Prevent the transmission of infectious organisms to other patients, staff members, and visitors.  Outcome: Resolved/Met     Problem: Patient Education:  Go to Education Activity  Goal: Patient/Family Education  Outcome: Resolved/Met     Problem: Falls - Risk of  Goal: *Absence of Falls  Description: Document Schmid Fall Risk and appropriate interventions in the flowsheet.  Outcome: Resolved/Met  Note: Fall Risk Interventions:            Medication Interventions: Patient to call before getting OOB, Teach patient to arise slowly    Elimination Interventions: Call light in reach              Problem: Patient Education: Go to Patient Education Activity  Goal: Patient/Family Education  Outcome: Resolved/Met     Problem: Pressure Injury - Risk of  Goal: *Prevention of pressure injury  Description: Document Braden Scale and appropriate interventions in the flowsheet.  Outcome: Resolved/Met  Note: Pressure Injury Interventions:            Activity Interventions: Increase time out of bed    Mobility Interventions: HOB 30 degrees or less    Nutrition Interventions: Document food/fluid/supplement intake                     Problem: Patient Education: Go to Patient Education Activity  Goal: Patient/Family Education  Outcome: Resolved/Met     Problem: Pain  Goal: *Control of Pain  Outcome: Resolved/Met  Goal: *PALLIATIVE CARE:  Alleviation of Pain  Outcome: Resolved/Met     Problem: Patient Education: Go to Patient Education Activity  Goal: Patient/Family Education  Outcome: Resolved/Met     Problem: General Medical Care Plan  Goal: *Vital signs within specified parameters  Outcome: Resolved/Met  Goal: *Labs within defined limits  Outcome: Resolved/Met  Goal: *Absence of infection signs and symptoms  Outcome: Resolved/Met  Goal: *Optimal pain control at patient's stated goal  Outcome: Resolved/Met  Goal: *Skin  integrity maintained  Outcome: Resolved/Met  Goal: *Fluid volume balance  Outcome: Resolved/Met  Goal: *Optimize nutritional status  Outcome: Resolved/Met  Goal: *Anxiety reduced or absent  Outcome: Resolved/Met  Goal: *Progressive mobility and function (eg: ADL's)  Outcome: Resolved/Met     Problem: Patient Education: Go to Patient Education Activity  Goal: Patient/Family Education  Outcome: Resolved/Met

## 2019-07-25 NOTE — Discharge Summary (Signed)
Inpatient hospitalist discharge summary                Brief Overview    PATIENT ID: Anita Bell    MRN: [id number]     DATE OF BIRTH: 11-23-1970    Admitting Provider: Revonda Standard, MD    Discharging Provider: Sammuel Hines, MD   To contact this individual call [phone] and ask the operator to page.  If unavailable ask to be transferred the Adult Hospitalist Department.      PCP at discharge: None None   None ([phone]) Patient stated that they have no PCP    Admission date: 07/21/2019  Date of Discharge: 07/25/19    Chief complaint:   Chief Complaint   Patient presents with   ??? Fever   ??? Generalized Body Aches     Patient Active Problem List   Diagnosis Code   ??? Extrinsic asthma, unspecified J45.909   ??? Myalgia and myositis, unspecified IMO0001   ??? ADHD (attention deficit hyperactivity disorder) F90.9   ??? Fever R50.9         Discharge diagnosis, hospital course/plan:  As per initial admission summary  Anita Bell??is a 48 y.o.??female??past medical history significant for polysubstance abuse, psychiatric disorders presented emergency room complaining on a fever. ??She stated that today she started having fevers and chills at home. ??Unknown how high it was temperature at home. ??She reports using cocaine and heroin around 7 PM and afterward she started feeling feverish. ??She denies any symptoms such as headache, cough, shortness of breath, chest pain, sore throat, runny nose, nausea, vomiting, diarrhea, urinary symptom. ??On arrival to the emergency room patient was found to be febrile with a temperature of 100.9 lethargic but arousable. ??Work-up overall unremarkable. ??Given her risk factor admission was requested. ??Patient awake and alert at this time 07/21/2019    Fever (POA):??unknown etiology.??resolved   - concern for bacteremia/endocarditis given hx IV drug use and new murmur vs viral infection.  Cardiology and ID consulted. ??  ???COVID 19 test negative  ???Blood culture NGTD  - CXR: no acute process    ?????abx stopped   - ID consulted, patient was monitored off antibiotics. Patient will be discharged today as no signs of infection and patient si afebrile. She wants to go home as well  I explained to the patient that in case of any worsening symptoms or new fever spikes she needs to come to the Ed.  I also advised her not to do any drugs including IV drug as she is at risk of getting severe complications and risk of death. She understood and agreed with plan   ??  ??Systolic Murmur:   - Estimated LVEF is 60 - 65%. Visually measured ejection fraction. Normal cavity size, wall thickness and systolic function (ejection fraction normal  Cardiology consulted  As per cards no need for TEE unless any changes in symptoms   ??  Polysubstance abuse:  - UDS: positive for cocaine, methadone, opiates and THC  - Monitor for signs of withdrawal. Supportive care  ??  Hx Chronic back pain   - on methadone and gabapentin   ??  Hypokalemia - replaced   ??    On the date of discharge, diagnostic face to face encounter was performed.  Patient was hemodynamically stable, offering no new complaints. Denies any shortness of breath at rest, no fevers or chills, no diarrhea or constipation. Patient is agreeable for discharge. Patient understood and verbalized the understanding of the discharge plan.  Patient was advised to seek medical help/ care or return to ED, if symptoms recur, worsen or new symptoms develop.      Discharge Disposition:  [facility]    Discharge activity:  Activity as tolerated    Code status at discharge:  Full Code     Outpatient follow up:  Please follow up with your PCP in one week      Future appointments-  No future appointments.  Follow-up Information     Follow up With Specialties Details Why Contact Info    follow up with your PCP  In 1 week      None    None (395) Patient stated that they have no PCP            Operative procedures performed:      Consults:  IP CONSULT TO INFECTIOUS DISEASES   IP CONSULT TO CARDIOLOGY    Procedures:  * No surgery found *    Diet:  DIET REGULAR  DIET ONE TIME MESSAGE  DIET ONE TIME MESSAGE    Pertinent test results:  Xr Chest Port    Result Date: 07/21/2019  INDICATION: Fever, eval for pna EXAM:  AP CHEST RADIOGRAPH COMPARISON: June 11, 2012 FINDINGS: AP portable view of the chest demonstrates a normal cardiomediastinal silhouette. There is no edema, effusion, consolidation, or pneumothorax. The osseous structures are unremarkable.     IMPRESSION: No acute process.      Recent Results (from the past 168 hour(s))   CBC WITH AUTOMATED DIFF    Collection Time: 07/21/19  1:27 AM   Result Value Ref Range    WBC 9.0 3.6 - 11.0 K/uL    RBC 3.95 3.80 - 5.20 M/uL    HGB 10.8 (L) 11.5 - 16.0 g/dL    HCT 34.2 (L) 35.0 - 47.0 %    MCV 86.6 80.0 - 99.0 FL    MCH 27.3 26.0 - 34.0 PG    MCHC 31.6 30.0 - 36.5 g/dL    RDW 14.9 (H) 11.5 - 14.5 %    PLATELET 220 150 - 400 K/uL    MPV 11.3 8.9 - 12.9 FL    NRBC 0.0 0 PER 100 WBC    ABSOLUTE NRBC 0.00 0.00 - 0.01 K/uL    NEUTROPHILS 85 (H) 32 - 75 %    BAND NEUTROPHILS 6 0 - 6 %    LYMPHOCYTES 7 (L) 12 - 49 %    MONOCYTES 2 (L) 5 - 13 %    EOSINOPHILS 0 0 - 7 %    BASOPHILS 0 0 - 1 %    IMMATURE GRANULOCYTES 0 %    ABS. NEUTROPHILS 8.2 (H) 1.8 - 8.0 K/UL    ABS. LYMPHOCYTES 0.6 (L) 0.8 - 3.5 K/UL    ABS. MONOCYTES 0.2 0.0 - 1.0 K/UL    ABS. EOSINOPHILS 0.0 0.0 - 0.4 K/UL    ABS. BASOPHILS 0.0 0.0 - 0.1 K/UL    ABS. IMM. GRANS. 0.0 K/UL    DF MANUAL      PLATELET COMMENTS Large Platelets      RBC COMMENTS ANISOCYTOSIS  1+        RBC COMMENTS VACUOLATED POLYS  OVALOCYTES  1+       METABOLIC PANEL, COMPREHENSIVE    Collection Time: 07/21/19  1:27 AM   Result Value Ref Range    Sodium 138 136 - 145 mmol/L    Potassium 3.2 (L) 3.5 - 5.1 mmol/L  Chloride 105 97 - 108 mmol/L    CO2 25 21 - 32 mmol/L    Anion gap 8 5 - 15 mmol/L    Glucose 114 (H) 65 - 100 mg/dL    BUN 9 6 - 20 MG/DL    Creatinine 1.06 (H) 0.55 - 1.02 MG/DL     BUN/Creatinine ratio 8 (L) 12 - 20      GFR est AA >60 >60 ml/min/1.8m    GFR est non-AA 56 (L) >60 ml/min/1.721m   Calcium 9.1 8.5 - 10.1 MG/DL    Bilirubin, total 0.6 0.2 - 1.0 MG/DL    ALT (SGPT) 25 12 - 78 U/L    AST (SGOT) 20 15 - 37 U/L    Alk. phosphatase 78 45 - 117 U/L    Protein, total 8.0 6.4 - 8.2 g/dL    Albumin 3.6 3.5 - 5.0 g/dL    Globulin 4.4 (H) 2.0 - 4.0 g/dL    A-G Ratio 0.8 (L) 1.1 - 2.2     CK    Collection Time: 07/21/19  1:27 AM   Result Value Ref Range    CK 125 26 - 192 U/L   TROPONIN I    Collection Time: 07/21/19  1:27 AM   Result Value Ref Range    Troponin-I, Qt. <0.05 <0.05 ng/mL   ETHYL ALCOHOL    Collection Time: 07/21/19  1:50 AM   Result Value Ref Range    ALCOHOL(ETHYL),SERUM <10 <10 MG/DL   EKG, 12 LEAD, INITIAL    Collection Time: 07/21/19  2:18 AM   Result Value Ref Range    Ventricular Rate 67 BPM    Atrial Rate 67 BPM    P-R Interval 166 ms    QRS Duration 82 ms    Q-T Interval 424 ms    QTC Calculation (Bezet) 448 ms    Calculated P Axis 59 degrees    Calculated R Axis 5 degrees    Calculated T Axis 0 degrees    Diagnosis       Normal sinus rhythm  Nonspecific T wave abnormality  No previous ECGs available  Confirmed by CrLarey SeatM.D., [name] ([phone] 07/21/2019 11:17:15 AM     URINALYSIS W/MICROSCOPIC    Collection Time: 07/21/19  3:44 AM   Result Value Ref Range    Color YELLOW/STRAW      Appearance CLEAR CLEAR      Specific gravity 1.008 1.003 - 1.030      pH (UA) 7.0 5.0 - 8.0      Protein Negative NEG mg/dL    Glucose Negative NEG mg/dL    Ketone Negative NEG mg/dL    Bilirubin Negative NEG      Blood TRACE (A) NEG      Urobilinogen 1.0 0.2 - 1.0 EU/dL    Nitrites Negative NEG      Leukocyte Esterase TRACE (A) NEG      WBC 0-4 0 - 4 /hpf    RBC 0-5 0 - 5 /hpf    Epithelial cells FEW FEW /lpf    Bacteria Negative NEG /hpf   URINE CULTURE HOLD SAMPLE    Collection Time: 07/21/19  3:44 AM    Specimen: Serum; Urine   Result Value Ref Range    Urine culture hold         Urine on hold in Microbiology dept for 2 days.  If unpreserved urine is submitted, it cannot be used for addtional testing after 24 hours,  recollection will be required.   DRUG SCREEN, URINE    Collection Time: 07/21/19  3:44 AM   Result Value Ref Range    AMPHETAMINES Negative NEG      BARBITURATES Negative NEG      BENZODIAZEPINES Negative NEG      COCAINE Positive (A) NEG      METHADONE Positive (A) NEG      OPIATES Positive (A) NEG      PCP(PHENCYCLIDINE) Negative NEG      THC (TH-CANNABINOL) Positive (A) NEG      Drug screen comment (NOTE)    LACTIC ACID    Collection Time: 07/21/19  4:22 AM   Result Value Ref Range    Lactic acid 1.1 0.4 - 2.0 MMOL/L   CULTURE, BLOOD, PAIRED    Collection Time: 07/21/19  4:22 AM    Specimen: Blood   Result Value Ref Range    Special Requests: NO SPECIAL REQUESTS      Culture result: NO GROWTH 4 DAYS     PROTHROMBIN TIME + INR    Collection Time: 07/21/19  6:19 AM   Result Value Ref Range    INR 1.1 0.9 - 1.1      Prothrombin time 11.5 (H) 9.0 - 11.1 sec   PTT    Collection Time: 07/21/19  6:19 AM   Result Value Ref Range    aPTT 26.8 22.1 - 32.0 sec    aPTT, therapeutic range     58.0 - 77.0 SECS   FIBRINOGEN    Collection Time: 07/21/19  6:19 AM   Result Value Ref Range    Fibrinogen 254 200 - 475 mg/dL   LD    Collection Time: 07/21/19  6:19 AM   Result Value Ref Range    LD 206 81 - 246 U/L   FERRITIN    Collection Time: 07/21/19  6:19 AM   Result Value Ref Range    Ferritin 49 26 - 388 NG/ML   D DIMER    Collection Time: 07/21/19  6:19 AM   Result Value Ref Range    D-dimer 1.86 (H) 0.00 - 0.65 mg/L FEU   PROCALCITONIN    Collection Time: 07/21/19  6:19 AM   Result Value Ref Range    Procalcitonin 19.55 ng/mL   SARS-COV-2    Collection Time: 07/21/19  6:19 AM   Result Value Ref Range    Specimen source Nasopharyngeal      SARS-CoV-2 Not Detected Not Detected      Specimen source Nasopharyngeal      Specimen type NP Swab      Health status Symptomatic Testing      SAMPLES BEING HELD    Collection Time: 07/21/19  6:24 AM   Result Value Ref Range    SAMPLES BEING HELD 1LAV     COMMENT        Add-on orders for these samples will be processed based on acceptable specimen integrity and analyte stability, which may vary by analyte.   PROTHROMBIN TIME + INR    Collection Time: 07/22/19  6:30 AM   Result Value Ref Range    INR 1.1 0.9 - 1.1      Prothrombin time 11.3 (H) 9.0 - 11.1 sec   PTT    Collection Time: 07/22/19  6:30 AM   Result Value Ref Range    aPTT 28.2 22.1 - 32.0 sec    aPTT, therapeutic range     58.0 - 77.0 SECS  FIBRINOGEN    Collection Time: 07/22/19  6:30 AM   Result Value Ref Range    Fibrinogen 276 200 - 475 mg/dL   D DIMER    Collection Time: 07/22/19  6:30 AM   Result Value Ref Range    D-dimer 1.54 (H) 0.00 - 0.65 mg/L FEU   METABOLIC PANEL, BASIC    Collection Time: 07/22/19  6:30 AM   Result Value Ref Range    Sodium 142 136 - 145 mmol/L    Potassium 3.2 (L) 3.5 - 5.1 mmol/L    Chloride 111 (H) 97 - 108 mmol/L    CO2 25 21 - 32 mmol/L    Anion gap 6 5 - 15 mmol/L    Glucose 80 65 - 100 mg/dL    BUN 8 6 - 20 MG/DL    Creatinine 1.01 0.55 - 1.02 MG/DL    BUN/Creatinine ratio 8 (L) 12 - 20      GFR est AA >60 >60 ml/min/1.15m    GFR est non-AA 59 (L) >60 ml/min/1.78m   Calcium 8.5 8.5 - 10.1 MG/DL   CBC WITH AUTOMATED DIFF    Collection Time: 07/22/19  6:30 AM   Result Value Ref Range    WBC 5.7 3.6 - 11.0 K/uL    RBC 3.73 (L) 3.80 - 5.20 M/uL    HGB 10.1 (L) 11.5 - 16.0 g/dL    HCT 32.2 (L) 35.0 - 47.0 %    MCV 86.3 80.0 - 99.0 FL    MCH 27.1 26.0 - 34.0 PG    MCHC 31.4 30.0 - 36.5 g/dL    RDW 15.4 (H) 11.5 - 14.5 %    PLATELET 204 150 - 400 K/uL    MPV 11.9 8.9 - 12.9 FL    NRBC 0.0 0 PER 100 WBC    ABSOLUTE NRBC 0.00 0.00 - 0.01 K/uL    NEUTROPHILS 52 32 - 75 %    LYMPHOCYTES 37 12 - 49 %    MONOCYTES 7 5 - 13 %    EOSINOPHILS 4 0 - 7 %    BASOPHILS 0 0 - 1 %    IMMATURE GRANULOCYTES 0 0.0 - 0.5 %    ABS. NEUTROPHILS 3.0 1.8 - 8.0 K/UL     ABS. LYMPHOCYTES 2.1 0.8 - 3.5 K/UL    ABS. MONOCYTES 0.4 0.0 - 1.0 K/UL    ABS. EOSINOPHILS 0.2 0.0 - 0.4 K/UL    ABS. BASOPHILS 0.0 0.0 - 0.1 K/UL    ABS. IMM. GRANS. 0.0 0.00 - 0.04 K/UL    DF AUTOMATED     METABOLIC PANEL, BASIC    Collection Time: 07/23/19  6:03 AM   Result Value Ref Range    Sodium 143 136 - 145 mmol/L    Potassium 4.0 3.5 - 5.1 mmol/L    Chloride 111 (H) 97 - 108 mmol/L    CO2 26 21 - 32 mmol/L    Anion gap 6 5 - 15 mmol/L    Glucose 97 65 - 100 mg/dL    BUN 11 6 - 20 MG/DL    Creatinine 0.97 0.55 - 1.02 MG/DL    BUN/Creatinine ratio 11 (L) 12 - 20      GFR est AA >60 >60 ml/min/1.739m  GFR est non-AA >60 >60 ml/min/1.46m65m Calcium 8.8 8.5 - 10.1 MG/DL   CBC WITH AUTOMATED DIFF    Collection Time: 07/23/19  6:03 AM   Result Value  Ref Range    WBC 4.8 3.6 - 11.0 K/uL    RBC 3.62 (L) 3.80 - 5.20 M/uL    HGB 9.9 (L) 11.5 - 16.0 g/dL    HCT 31.3 (L) 35.0 - 47.0 %    MCV 86.5 80.0 - 99.0 FL    MCH 27.3 26.0 - 34.0 PG    MCHC 31.6 30.0 - 36.5 g/dL    RDW 15.7 (H) 11.5 - 14.5 %    PLATELET 203 150 - 400 K/uL    MPV 11.2 8.9 - 12.9 FL    NRBC 0.0 0 PER 100 WBC    ABSOLUTE NRBC 0.00 0.00 - 0.01 K/uL    NEUTROPHILS 26 (L) 32 - 75 %    LYMPHOCYTES 55 (H) 12 - 49 %    MONOCYTES 11 5 - 13 %    EOSINOPHILS 8 (H) 0 - 7 %    BASOPHILS 0 0 - 1 %    IMMATURE GRANULOCYTES 0 0.0 - 0.5 %    ABS. NEUTROPHILS 1.3 (L) 1.8 - 8.0 K/UL    ABS. LYMPHOCYTES 2.6 0.8 - 3.5 K/UL    ABS. MONOCYTES 0.5 0.0 - 1.0 K/UL    ABS. EOSINOPHILS 0.4 0.0 - 0.4 K/UL    ABS. BASOPHILS 0.0 0.0 - 0.1 K/UL    ABS. IMM. GRANS. 0.0 0.00 - 0.04 K/UL    DF AUTOMATED     ECHO ADULT COMPLETE    Collection Time: 07/23/19  9:22 AM   Result Value Ref Range    IVSd 0.69 0.6 - 0.9 cm    LVIDd 4.72 3.9 - 5.3 cm    LVIDs 2.95 cm    LVOT d 1.88 cm    LVPWd 0.87 0.6 - 0.9 cm    LVOT Peak Gradient 5.36 mmHg    LVOT SV 62.8 mL    LVOT Peak Velocity 115.80 cm/s    LVOT VTI 22.66 cm    RVIDd 3.83 cm    RVSP 32.14 mmHg    RVSP 30.15 mmHg     RVSP 32.02 mmHg    RVSP 28.15 mmHg    RVSP 32.14 mmHg    Left Atrium Major Axis 3.31 cm    LA Volume 57.21 22 - 52 mL    LA Area 4C 19.09 cm2    LA Vol 2C 56.44 (A) 22 - 52 mL    LA Vol 4C 49.34 22 - 52 mL    Est. RA Pressure 3.00 mmHg    Aortic Valve Area by Continuity of Peak Velocity 1.99 cm2    Aortic Valve Area by Continuity of Peak Velocity 1.99 cm2    Aortic Valve Area by Continuity of VTI 1.91 cm2    Aortic Valve Area by Continuity of VTI 1.91 cm2    AoV PG 10.39 mmHg    Aortic Valve Systolic Mean Gradient 7.10 mmHg    Aortic Valve Systolic Peak Velocity 626.94 cm/s    AoV VTI 32.90 cm    MV A Vel 54.66 cm/s    Mitral Valve E Wave Deceleration Time 0.16 s    MV E Vel 96.30 cm/s    MV E/A 1.76     E/E' ratio (averaged) 7.31     E/E' lateral 5.38     E/E' septal 9.24     LV E' Lateral Velocity 17.91 cm/s    LV E' Septal Velocity 10.42 cm/s    Mitral Valve Pressure Half-time 0.05 s    MVA (PHT) 4.63  cm2    Pulmonic Valve Systolic Peak Instantaneous Gradient 5.16 mmHg    Pulmonic Regurgitant End Max Velocity 113.58 cm/s    Pulmonic Valve Systolic Peak Instantaneous Gradient 3.88 mmHg    Pulmonic Valve Max Velocity 98.50 cm/s    Tapse 2.27 (A) 1.5 - 2.0 cm    Triscuspid Valve Regurgitation Peak Gradient 29.14 mmHg    Triscuspid Valve Regurgitation Peak Gradient 27.15 mmHg    Triscuspid Valve Regurgitation Peak Gradient 29.02 mmHg    Triscuspid Valve Regurgitation Peak Gradient 25.15 mmHg    Triscuspid Valve Regurgitation Peak Gradient 29.14 mmHg    TR Max Velocity 269.90 cm/s    TR Max Velocity 260.54 cm/s    TR Max Velocity 269.33 cm/s    TR Max Velocity 250.74 cm/s    TR Max Velocity 269.90 cm/s    Ao Root D 2.71 cm    LV Mass AL 119.2 67 - 162 g    LV Mass AL Index 63.7 43 - 95 g/m2    Left Atrium Minor Axis 1.77 cm    LA Vol Index 30.58 16 - 28 ml/m2    LA Vol Index 30.17 16 - 28 ml/m2    LA Vol Index 26.37 16 - 28 ml/m2   METABOLIC PANEL, BASIC    Collection Time: 07/24/19  5:38 AM    Result Value Ref Range    Sodium 140 136 - 145 mmol/L    Potassium 4.2 3.5 - 5.1 mmol/L    Chloride 110 (H) 97 - 108 mmol/L    CO2 26 21 - 32 mmol/L    Anion gap 4 (L) 5 - 15 mmol/L    Glucose 85 65 - 100 mg/dL    BUN 12 6 - 20 MG/DL    Creatinine 1.05 (H) 0.55 - 1.02 MG/DL    BUN/Creatinine ratio 11 (L) 12 - 20      GFR est AA >60 >60 ml/min/1.45m    GFR est non-AA 56 (L) >60 ml/min/1.762m   Calcium 8.5 8.5 - 10.1 MG/DL   MAGNESIUM    Collection Time: 07/24/19  5:38 AM   Result Value Ref Range    Magnesium 2.0 1.6 - 2.4 mg/dL   CBC WITH AUTOMATED DIFF    Collection Time: 07/24/19 11:50 AM   Result Value Ref Range    WBC 5.7 3.6 - 11.0 K/uL    RBC 3.86 3.80 - 5.20 M/uL    HGB 10.5 (L) 11.5 - 16.0 g/dL    HCT 33.4 (L) 35.0 - 47.0 %    MCV 86.5 80.0 - 99.0 FL    MCH 27.2 26.0 - 34.0 PG    MCHC 31.4 30.0 - 36.5 g/dL    RDW 15.6 (H) 11.5 - 14.5 %    PLATELET 236 150 - 400 K/uL    MPV 11.2 8.9 - 12.9 FL    NRBC 0.0 0 PER 100 WBC    ABSOLUTE NRBC 0.00 0.00 - 0.01 K/uL    NEUTROPHILS 32 32 - 75 %    LYMPHOCYTES 51 (H) 12 - 49 %    MONOCYTES 8 5 - 13 %    EOSINOPHILS 9 (H) 0 - 7 %    BASOPHILS 0 0 - 1 %    IMMATURE GRANULOCYTES 0 0.0 - 0.5 %    ABS. NEUTROPHILS 1.8 1.8 - 8.0 K/UL    ABS. LYMPHOCYTES 2.9 0.8 - 3.5 K/UL    ABS. MONOCYTES 0.5 0.0 - 1.0 K/UL  ABS. EOSINOPHILS 0.5 (H) 0.0 - 0.4 K/UL    ABS. BASOPHILS 0.0 0.0 - 0.1 K/UL    ABS. IMM. GRANS. 0.0 0.00 - 0.04 K/UL    DF AUTOMATED             Physical Exam on Discharge:    Discharge condition: good    Vital signs:   Patient Vitals for the past 12 hrs:   Temp Pulse Resp BP SpO2   07/25/19 0833 98.6 ??F (37 ??C) (!) 57 16 120/62 100 %   07/25/19 0425 98.5 ??F (36.9 ??C) 61 16 102/66 97 %   07/24/19 2304 98.4 ??F (36.9 ??C) 65 16 (!) 145/87 100 %       Visit Vitals  BP 120/62 (BP 1 Location: Left arm, BP Patient Position: At rest)   Pulse (!) 57   Temp 98.6 ??F (37 ??C)   Resp 16   Ht 5' 3"  (1.6 m)   Wt 83.9 kg (185 lb)   SpO2 100%   BMI 32.77 kg/m??      General:  Alert, cooperative, no distress, appears stated age.   Head:  Normocephalic, without obvious abnormality, atraumatic.   Lungs:   Clear to auscultation bilaterally.   Chest wall:  No tenderness or deformity.   Heart:  Regular rate and rhythm, S1, S2 normal, no murmur, click, rub or gallop.   Abdomen:   Soft, non-tender. Bowel sounds normal. No masses,  No organomegaly.                   Current Discharge Medication List      CONTINUE these medications which have NOT CHANGED    Details   methadone (DOLOPHINE) 10 mg/mL solution Take 85 mg by mouth daily. Indications: symptoms from stopping treatment with opioid drugs      rizatriptan (MAXALT-MLT) 10 mg disintegrating tablet       topiramate (TOPAMAX) 50 mg tablet       buprenorphine-naloxone (SUBOXONE) 8-2 mg film sublingaul film 1 Film by SubLINGual route three (3) times daily.    Comments: .      DULoxetine (CYMBALTA) 60 mg capsule Take 60 mg by mouth daily.      famotidine (PEPCID) 20 mg tablet Take 20 mg by mouth two (2) times a day.      gabapentin (NEURONTIN) 800 mg tablet Take 800 mg by mouth three (3) times daily.      mirtazapine (REMERON SOL-TAB) 15 mg disintegrating tablet Take 15 mg by mouth nightly.      risperiDONE (RisperDAL) 1 mg tablet Take 1 mg by mouth daily.         STOP taking these medications       meloxicam (MOBIC) 7.5 mg tablet Comments:   Reason for Stopping:         naproxen (NAPROSYN) 375 mg tablet Comments:   Reason for Stopping:                 Total time spent on discharge planning, counseling and co-ordination of care:   35 minutes    Sammuel Hines, MD  07/25/19  9:56 AM

## 2019-07-25 NOTE — Progress Notes (Signed)
Problem: Risk for Spread of Infection  Goal: Prevent transmission of infectious organism to others  Description: Prevent the transmission of infectious organisms to other patients, staff members, and visitors.  Outcome: Resolved/Met     Problem: Patient Education:  Go to Education Activity  Goal: Patient/Family Education  Outcome: Resolved/Met     Problem: Falls - Risk of  Goal: *Absence of Falls  Description: Document Schmid Fall Risk and appropriate interventions in the flowsheet.  Outcome: Resolved/Met  Note: Fall Risk Interventions:            Medication Interventions: Patient to call before getting OOB, Teach patient to arise slowly    Elimination Interventions: Call light in reach              Problem: Patient Education: Go to Patient Education Activity  Goal: Patient/Family Education  Outcome: Resolved/Met     Problem: Pressure Injury - Risk of  Goal: *Prevention of pressure injury  Description: Document Braden Scale and appropriate interventions in the flowsheet.  Outcome: Resolved/Met  Note: Pressure Injury Interventions:            Activity Interventions: Increase time out of bed    Mobility Interventions: HOB 30 degrees or less    Nutrition Interventions: Document food/fluid/supplement intake                     Problem: Patient Education: Go to Patient Education Activity  Goal: Patient/Family Education  Outcome: Resolved/Met     Problem: Pain  Goal: *Control of Pain  Outcome: Resolved/Met  Goal: *PALLIATIVE CARE:  Alleviation of Pain  Outcome: Resolved/Met     Problem: Patient Education: Go to Patient Education Activity  Goal: Patient/Family Education  Outcome: Resolved/Met     Problem: General Medical Care Plan  Goal: *Vital signs within specified parameters  Outcome: Resolved/Met  Goal: *Labs within defined limits  Outcome: Resolved/Met  Goal: *Absence of infection signs and symptoms  Outcome: Resolved/Met  Goal: *Optimal pain control at patient's stated goal  Outcome: Resolved/Met   Goal: *Skin integrity maintained  Outcome: Resolved/Met  Goal: *Fluid volume balance  Outcome: Resolved/Met  Goal: *Optimize nutritional status  Outcome: Resolved/Met  Goal: *Anxiety reduced or absent  Outcome: Resolved/Met  Goal: *Progressive mobility and function (eg: ADL's)  Outcome: Resolved/Met     Problem: Patient Education: Go to Patient Education Activity  Goal: Patient/Family Education  Outcome: Resolved/Met

## 2019-07-25 NOTE — Progress Notes (Signed)
Problem: Risk for Spread of Infection  Goal: Prevent transmission of infectious organism to others  Description: Prevent the transmission of infectious organisms to other patients, staff members, and visitors.  Outcome: Progressing Towards Goal

## 2019-07-25 NOTE — Progress Notes (Signed)
Bedside shift change report given to Serena RN (oncoming nurse) by Janet RN (offgoing nurse). Report included the following information SBAR, Kardex and MAR.

## 2019-07-26 LAB — CULTURE, BLOOD, PAIRED
Culture result:: NO GROWTH
Culture: NO GROWTH

## 2019-09-15 ENCOUNTER — Inpatient Hospital Stay
Admit: 2019-09-15 | Discharge: 2019-09-15 | Disposition: A | Discharge status: Home / Self Care | Payer: MEDICAID | Attending: Emergency Medicine

## 2019-09-15 ENCOUNTER — Emergency Department: Admit: 2019-09-15 | Payer: MEDICAID

## 2019-09-15 DIAGNOSIS — N938 Other specified abnormal uterine and vaginal bleeding: Secondary | ICD-10-CM

## 2019-09-15 LAB — CBC WITH AUTO DIFFERENTIAL
Basophils %: 0 % (ref 0–1)
Basophils Absolute: 0 10*3/uL (ref 0.0–0.1)
Eosinophils %: 1 % (ref 0–7)
Eosinophils Absolute: 0.1 10*3/uL (ref 0.0–0.4)
Granulocyte Absolute Count: 0 10*3/uL (ref 0.00–0.04)
Hematocrit: 31.8 % — ABNORMAL LOW (ref 35.0–47.0)
Hemoglobin: 10.3 g/dL — ABNORMAL LOW (ref 11.5–16.0)
Immature Granulocytes: 0 % (ref 0.0–0.5)
Lymphocytes %: 45 % (ref 12–49)
Lymphocytes Absolute: 2.4 10*3/uL (ref 0.8–3.5)
MCH: 27.6 PG (ref 26.0–34.0)
MCHC: 32.4 g/dL (ref 30.0–36.5)
MCV: 85.3 FL (ref 80.0–99.0)
MPV: 11.6 FL (ref 8.9–12.9)
Monocytes %: 6 % (ref 5–13)
Monocytes Absolute: 0.3 10*3/uL (ref 0.0–1.0)
NRBC Absolute: 0 10*3/uL (ref 0.00–0.01)
Neutrophils %: 48 % (ref 32–75)
Neutrophils Absolute: 2.5 10*3/uL (ref 1.8–8.0)
Nucleated RBCs: 0 PER 100 WBC
Platelets: 275 10*3/uL (ref 150–400)
RBC: 3.73 M/uL — ABNORMAL LOW (ref 3.80–5.20)
RDW: 16 % — ABNORMAL HIGH (ref 11.5–14.5)
WBC: 5.3 10*3/uL (ref 3.6–11.0)

## 2019-09-15 LAB — CBC WITH AUTOMATED DIFF
ABS. BASOPHILS: 0 10*3/uL (ref 0.0–0.1)
ABS. EOSINOPHILS: 0.1 10*3/uL (ref 0.0–0.4)
ABS. IMM. GRANS.: 0 10*3/uL (ref 0.00–0.04)
ABS. LYMPHOCYTES: 2.4 10*3/uL (ref 0.8–3.5)
ABS. MONOCYTES: 0.3 10*3/uL (ref 0.0–1.0)
ABS. NEUTROPHILS: 2.5 10*3/uL (ref 1.8–8.0)
ABSOLUTE NRBC: 0 10*3/uL (ref 0.00–0.01)
BASOPHILS: 0 % (ref 0–1)
EOSINOPHILS: 1 % (ref 0–7)
HCT: 31.8 % — ABNORMAL LOW (ref 35.0–47.0)
HGB: 10.3 g/dL — ABNORMAL LOW (ref 11.5–16.0)
IMMATURE GRANULOCYTES: 0 % (ref 0.0–0.5)
LYMPHOCYTES: 45 % (ref 12–49)
MCH: 27.6 PG (ref 26.0–34.0)
MCHC: 32.4 g/dL (ref 30.0–36.5)
MCV: 85.3 FL (ref 80.0–99.0)
MONOCYTES: 6 % (ref 5–13)
MPV: 11.6 FL (ref 8.9–12.9)
NEUTROPHILS: 48 % (ref 32–75)
NRBC: 0 PER 100 WBC
PLATELET: 275 10*3/uL (ref 150–400)
RBC: 3.73 M/uL — ABNORMAL LOW (ref 3.80–5.20)
RDW: 16 % — ABNORMAL HIGH (ref 11.5–14.5)
WBC: 5.3 10*3/uL (ref 3.6–11.0)

## 2019-09-15 MED ORDER — KETOROLAC TROMETHAMINE 10 MG TAB
10 mg | ORAL_TABLET | Freq: Four times a day (QID) | ORAL | 0 refills | Status: AC | PRN
Start: 2019-09-15 — End: ?

## 2019-09-15 MED ORDER — NAPROXEN 500 MG TAB
500 mg | ORAL_TABLET | Freq: Two times a day (BID) | ORAL | 0 refills | Status: DC | PRN
Start: 2019-09-15 — End: 2019-09-15

## 2019-09-15 MED ORDER — KETOROLAC TROMETHAMINE 30 MG/ML INJECTION
30 mg/mL (1 mL) | INTRAMUSCULAR | Status: AC
Start: 2019-09-15 — End: 2019-09-15
  Administered 2019-09-15: 14:00:00 via INTRAMUSCULAR

## 2019-09-15 MED ORDER — KETOROLAC TROMETHAMINE 30 MG/ML INJECTION
30 mg/mL (1 mL) | INTRAMUSCULAR | Status: DC
Start: 2019-09-15 — End: 2019-09-15

## 2019-09-15 MED FILL — KETOROLAC TROMETHAMINE 30 MG/ML INJECTION: 30 mg/mL (1 mL) | INTRAMUSCULAR | Qty: 1

## 2019-09-15 MED FILL — KETOROLAC TROMETHAMINE 30 MG/ML INJECTION: 30 mg/mL (1 mL) | INTRAMUSCULAR | Qty: 2

## 2019-09-15 NOTE — ED Notes (Signed)
Pt in a gown ready for ultrasound

## 2019-09-15 NOTE — ED Notes (Signed)
Pt reports being able to tolerate Ibuprofen now. Stated she was able to tolerate it before because she was taking too large of doses. Pt verbalized this in front of Dr. Nelda Marseille as well.

## 2019-09-15 NOTE — ED Provider Notes (Signed)
EMERGENCY DEPARTMENT HISTORY AND PHYSICAL EXAM      Date: 09/15/2019  Patient Name: Anita Bell    History of Presenting Illness     Chief Complaint   Patient presents with   ??? Vaginal Bleeding     History Provided By: Patient and EMS    HPI: Anita Bell, 48 y.o. female with past medical history significant for depression, fibromyalgia, and chronic back pain secondary to a spinal cord injury who presents via EMS to the ED with cc of heavy vaginal bleeding and pelvic cramping for the past day and a half.  Patient states she had an irregular cycle last month and missed her cycle the month before last.  She stated that 2 days ago she started having heavy vaginal bleeding and pelvic cramping bilaterally.  She states his bleeding is more than a normal menstrual cycle.  She informed the nurse that she has gone through 4 pads over the past 24 hours and her pain is worsening.  This morning when she woke up, the vaginal bleeding had slowed down some which is why she came to the ED today.  She did take her methadone prior to calling EMS which has helped with her pain.    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    No current facility-administered medications on file prior to encounter.      Current Outpatient Medications on File Prior to Encounter   Medication Sig Dispense Refill   ??? methadone (DOLOPHINE) 10 mg/mL solution Take 85 mg by mouth daily. Indications: symptoms from stopping treatment with opioid drugs     ??? topiramate (TOPAMAX) 50 mg tablet      ??? famotidine (PEPCID) 20 mg tablet Take 20 mg by mouth two (2) times a day.     ??? gabapentin (NEURONTIN) 800 mg tablet Take 800 mg by mouth three (3) times daily.     ??? rizatriptan (MAXALT-MLT) 10 mg disintegrating tablet       ??? buprenorphine-naloxone (SUBOXONE) 8-2 mg film sublingaul film 1 Film by SubLINGual route three (3) times daily.     ??? DULoxetine (CYMBALTA) 60 mg capsule Take 60 mg by mouth daily.     ??? mirtazapine (REMERON SOL-TAB) 15 mg disintegrating tablet Take 15 mg by mouth nightly.     ??? risperiDONE (RisperDAL) 1 mg tablet Take 1 mg by mouth daily.       Past History     Past Medical History:  Past Medical History:   Diagnosis Date   ??? ADHD    ??? Asthma    ??? Depression    ??? Fibromyalgia    ??? Gastroenteritis    ??? Hydradenitis     suppurativa   ??? Osteoarthritis    ??? Other ill-defined conditions(799.89)     chronic back pain   ??? Psychiatric disorder      Past Surgical History:  Past Surgical History:   Procedure Laterality Date   ??? HX GYN      tubes tided     Family History:  Family History   Problem Relation Age of Onset   ??? Alcohol abuse Neg Hx    ??? Arthritis-rheumatoid Neg Hx    ??? Asthma Neg Hx    ??? Bleeding Prob Neg Hx    ???  Cancer Neg Hx    ??? Diabetes Neg Hx    ??? Elevated Lipids Neg Hx    ??? Headache Neg Hx    ??? Heart Disease Neg Hx    ??? Hypertension Neg Hx    ??? Lung Disease Neg Hx    ??? Migraines Neg Hx    ??? Psychiatric Disorder Neg Hx    ??? Stroke Neg Hx    ??? Mental Retardation Neg Hx      Social History:  Social History     Tobacco Use   ??? Smoking status: Never Smoker   ??? Smokeless tobacco: Never Used   Substance Use Topics   ??? Alcohol use: No   ??? Drug use: Not Currently     Types: Prescription, Opiates, Cocaine, Heroin     Allergies:  Allergies   Allergen Reactions   ??? Aspirin Nausea and Vomiting   ??? Aspirin Hives     Review of Systems   Review of Systems   Constitutional: Negative for chills and fever.   HENT: Negative for congestion, rhinorrhea, sneezing and sore throat.    Eyes: Negative for redness and visual disturbance.   Respiratory: Negative for shortness of breath.    Cardiovascular: Negative for leg swelling.   Gastrointestinal: Negative for abdominal pain, nausea and vomiting.    Genitourinary: Positive for menstrual problem, pelvic pain and vaginal bleeding. Negative for difficulty urinating and frequency.   Musculoskeletal: Negative for back pain, myalgias and neck stiffness.   Skin: Negative for rash.   Neurological: Negative for dizziness, syncope, weakness and headaches.   Hematological: Negative for adenopathy.   All other systems reviewed and are negative.    Physical Exam   Physical Exam  Vitals signs and nursing note reviewed.   Constitutional:       Appearance: Normal appearance. She is well-developed.   HENT:      Head: Normocephalic and atraumatic.   Eyes:      Conjunctiva/sclera: Conjunctivae normal.   Neck:      Musculoskeletal: Full passive range of motion without pain, normal range of motion and neck supple.   Cardiovascular:      Rate and Rhythm: Normal rate and regular rhythm.      Pulses: Normal pulses.      Heart sounds: Normal heart sounds, S1 normal and S2 normal. No murmur.   Pulmonary:      Effort: Pulmonary effort is normal. No respiratory distress.      Breath sounds: Normal breath sounds. No wheezing.   Abdominal:      General: Bowel sounds are normal. There is no distension.      Palpations: Abdomen is soft.      Tenderness: There is no abdominal tenderness. There is no rebound.   Musculoskeletal: Normal range of motion.   Skin:     General: Skin is warm and dry.      Findings: No rash.   Neurological:      Mental Status: She is alert and oriented to person, place, and time.   Psychiatric:         Speech: Speech normal.         Behavior: Behavior normal.         Thought Content: Thought content normal.         Judgment: Judgment normal.       Diagnostic Study Results   Labs -     Recent Results (from the past 12 hour(s))   CBC WITH AUTOMATED DIFF  Collection Time: 09/15/19  8:36 AM   Result Value Ref Range    WBC 5.3 3.6 - 11.0 K/uL    RBC 3.73 (L) 3.80 - 5.20 M/uL    HGB 10.3 (L) 11.5 - 16.0 g/dL    HCT 31.8 (L) 35.0 - 47.0 %    MCV 85.3 80.0 - 99.0 FL     MCH 27.6 26.0 - 34.0 PG    MCHC 32.4 30.0 - 36.5 g/dL    RDW 16.0 (H) 11.5 - 14.5 %    PLATELET 275 150 - 400 K/uL    MPV 11.6 8.9 - 12.9 FL    NRBC 0.0 0 PER 100 WBC    ABSOLUTE NRBC 0.00 0.00 - 0.01 K/uL    NEUTROPHILS 48 32 - 75 %    LYMPHOCYTES 45 12 - 49 %    MONOCYTES 6 5 - 13 %    EOSINOPHILS 1 0 - 7 %    BASOPHILS 0 0 - 1 %    IMMATURE GRANULOCYTES 0 0.0 - 0.5 %    ABS. NEUTROPHILS 2.5 1.8 - 8.0 K/UL    ABS. LYMPHOCYTES 2.4 0.8 - 3.5 K/UL    ABS. MONOCYTES 0.3 0.0 - 1.0 K/UL    ABS. EOSINOPHILS 0.1 0.0 - 0.4 K/UL    ABS. BASOPHILS 0.0 0.0 - 0.1 K/UL    ABS. IMM. GRANS. 0.0 0.00 - 0.04 K/UL    DF AUTOMATED         Radiologic Studies -   US TRANSVAGINAL   Final Result   IMPRESSION:  Normal transvaginal ultrasound examination.                        [place] PELV NON OBS    (Results Pending)     US Transvaginal    Result Date: 09/15/2019  IMPRESSION:  Normal transvaginal ultrasound examination.     Medical Decision Making   I am the first provider for this patient.    I reviewed the vital signs, available nursing notes, past medical history, past surgical history, family history and social history.    Vital Signs-Reviewed the patient's vital signs.  Patient Vitals for the past 24 hrs:   Temp Pulse Resp BP SpO2   09/15/19 0959 ??? ??? ??? ??? 100 %   09/15/19 0938 ??? ??? ??? ??? 100 %   09/15/19 0937 ??? ??? ??? 120/74 ???   09/15/19 0753 98.7 ??F (37.1 ??C) 60 18 (!) 159/85 100 %     Pulse Oximetry Analysis - 100% on RA    Records Reviewed: Nursing Notes and Old Medical Records    Provider Notes (Medical Decision Making):   48 year old female presents with dysfunctional uterine bleeding and pelvic cramping for the past 2 days.  Differential includes perimenopausal bleeding, uterine fibroids, pregnancy, ectopic pregnancy, and low suspicion for intrauterine mass.  Will check basic labs and ultrasound her uterus and reassess.    ED Course:   Initial assessment performed. The patients presenting problems have been  discussed, and they are in agreement with the care plan formulated and outlined with them.  I have encouraged them to ask questions as they arise throughout their visit.    Progress Note  9:54 AM  I have re-evaluated pt and she states that her pain is down to a 3 out of 10.  It initially was a 7-8 out of 10.  I informed her of her normal labs and ultrasound results.  Will  discharge with OB/GYN follow-up and a prescription for naproxen since she tolerated the Toradol in the emergency department.    Progress Note:   Updated pt on all returned results and findings. Discussed the importance of proper follow up as referred below along with return precautions. Pt in agreement with the care plan and expresses agreement with and understanding of all items discussed.    Disposition:  Discharge Note:  The pt is ready for discharge. The pt's signs, symptoms, diagnosis, and discharge instructions have been discussed and pt has conveyed their understanding. The pt is to follow up as recommended or return to ER should their symptoms worsen. Plan has been discussed and pt is in agreement.    PLAN:  1.   Current Discharge Medication List      START taking these medications    Details   naproxen (NAPROSYN) 500 mg tablet Take 1 Tab by mouth every twelve (12) hours as needed for Pain.  Qty: 20 Tab, Refills: 0           2.   Follow-up Information     Follow up With Specialties Details Why Contact Info    [facility] Schedule an appointment as soon as possible for a visit  [address] [place]  [phone]    [facility] EMERGENCY DEPT Emergency Medicine  If symptoms worsen [place]  [phone]        Return to ED if worse     Diagnosis     Clinical Impression:   1. DUB (dysfunctional uterine bleeding)    2. Perimenopausal symptom            Please note that this dictation was completed with Dragon, Recruitment consultant.  Quite often unanticipated grammatical, syntax, homophones, and other interpretive errors are inadvertently transcribed by the computer software.  Please disregard these errors.  Additionally, please excuse any errors that have escaped final proofreading.

## 2019-09-15 NOTE — ED Triage Notes (Signed)
Intermittent vaginal bleeding over the past 2 months. Pt reports going through 4 pads in the past 24 hours with increased pain.

## 2019-09-15 NOTE — ED Provider Notes (Signed)
ED Provider Notes by Luther Hearing, MD at 09/15/19 0800                Author: Luther Hearing, MD  Service: Emergency Medicine  Author Type: Physician       Filed: 09/15/19 1005  Date of Service: 09/15/19 0800  Status: Signed          Editor: Luther Hearing, MD (Physician)               EMERGENCY DEPARTMENT HISTORY AND PHYSICAL EXAM           Date: 09/15/2019   Patient Name: Anita Bell        History of Presenting Illness          Chief Complaint       Patient presents with        ?  Vaginal Bleeding        History Provided By: Patient and EMS      HPI: Anita Bell,  48 y.o. female with past medical history significant for depression, fibromyalgia, and chronic  back pain secondary to a spinal cord injury who presents via EMS to the ED with cc of heavy vaginal bleeding and pelvic cramping for the past day and a half.  Patient states she had an irregular cycle last month and missed her cycle the month before last.   She stated that 2 days ago she started having heavy vaginal bleeding and pelvic cramping bilaterally.  She states his bleeding is more than a normal menstrual cycle.  She informed the nurse that she has gone through 4 pads over the past 24 hours and  her pain is worsening.  This morning when she woke up, the vaginal bleeding had slowed down some which is why she came to the ED today.  She did take her methadone prior to calling EMS which has helped with her pain.      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        No current facility-administered medications on file prior to encounter.           Current Outpatient Medications on File Prior to Encounter          Medication  Sig  Dispense  Refill           ?  methadone (DOLOPHINE) 10 mg/mL solution  Take 85 mg by  mouth daily. Indications: symptoms from stopping treatment with opioid drugs         ?  topiramate (TOPAMAX) 50 mg tablet           ?  famotidine (PEPCID) 20 mg tablet  Take 20 mg by mouth two (2) times a day.               ?  gabapentin (NEURONTIN) 800 mg tablet  Take 800 mg by mouth three (3) times daily.               ?  rizatriptan (MAXALT-MLT) 10 mg disintegrating tablet           ?  buprenorphine-naloxone (SUBOXONE) 8-2 mg film sublingaul film  1 Film by SubLINGual route three (3) times daily.         ?  DULoxetine (CYMBALTA) 60 mg capsule  Take 60 mg by mouth daily.         ?  mirtazapine (REMERON SOL-TAB) 15 mg disintegrating tablet  Take 15 mg by mouth nightly.               ?  risperiDONE (RisperDAL) 1 mg tablet  Take 1 mg by mouth daily.              Past History        Past Medical History:     Past Medical History:        Diagnosis  Date         ?  ADHD       ?  Asthma       ?  Depression       ?  Fibromyalgia       ?  Gastroenteritis       ?  Hydradenitis            suppurativa         ?  Osteoarthritis       ?  Other ill-defined conditions(799.89)            chronic back pain         ?  Psychiatric disorder          Past Surgical History:     Past Surgical History:         Procedure  Laterality  Date          ?  HX GYN              tubes tided        Family History:     Family History         Problem  Relation  Age of Onset          ?  Alcohol abuse  Neg Hx       ?  Arthritis-rheumatoid  Neg Hx       ?  Asthma  Neg Hx       ?  Bleeding Prob  Neg Hx       ?  Cancer  Neg Hx       ?  Diabetes  Neg Hx       ?  Elevated Lipids  Neg Hx       ?  Headache  Neg Hx       ?  Heart Disease  Neg Hx       ?  Hypertension  Neg Hx       ?  Lung Disease  Neg Hx       ?  Migraines  Neg Hx       ?  Psychiatric Disorder  Neg Hx       ?  Stroke  Neg Hx            ?  Mental Retardation  Neg Hx          Social History:     Social History          Tobacco Use         ?  Smoking status:  Never Smoker     ?  Smokeless tobacco:   Never Used       Substance Use Topics         ?  Alcohol use:  No     ?  Drug use:  Not Currently  Types:  Prescription, Opiates, Cocaine, Heroin        Allergies:     Allergies        Allergen  Reactions         ?  Aspirin  Nausea and Vomiting         ?  Aspirin  Hives          Review of Systems     Review of Systems    Constitutional: Negative for chills and fever.    HENT: Negative for congestion, rhinorrhea, sneezing and sore throat.     Eyes: Negative for redness and visual disturbance.    Respiratory: Negative for shortness of breath.     Cardiovascular: Negative for leg swelling.    Gastrointestinal: Negative for abdominal pain, nausea and vomiting.    Genitourinary: Positive for menstrual problem, pelvic pain  and vaginal bleeding. Negative for difficulty urinating and frequency.    Musculoskeletal: Negative for back pain, myalgias and neck stiffness.    Skin: Negative for rash.    Neurological: Negative for dizziness, syncope, weakness and headaches.    Hematological: Negative for adenopathy.    All other systems reviewed and are negative.        Physical Exam     Physical Exam   Vitals signs and nursing note reviewed.   Constitutional:        Appearance: Normal appearance. She is well-developed.   HENT :       Head: Normocephalic and atraumatic.   Eyes :       Conjunctiva/sclera: Conjunctivae normal.   Neck :       Musculoskeletal: Full passive range of motion without pain, normal range of motion and neck supple.    Cardiovascular:       Rate and Rhythm: Normal rate and regular rhythm.      Pulses: Normal pulses.      Heart sounds: Normal heart sounds, S1 normal and S2 normal. No murmur.   Pulmonary:       Effort: Pulmonary effort is normal. No respiratory distress.      Breath sounds: Normal breath sounds. No wheezing.    Abdominal:      General: Bowel sounds are normal. There is no distension.      Palpations: Abdomen is soft.      Tenderness: There is no abdominal tenderness. There is no  rebound.     Musculoskeletal: Normal range of motion.    Skin:      General: Skin is warm and dry.      Findings: No rash.    Neurological:       Mental Status: She is alert and oriented to person, place, and time.    Psychiatric:         Speech: Speech normal.         Behavior: Behavior normal.         Thought Content: Thought content normal.         Judgment: Judgment normal.            Diagnostic Study Results     Labs -         Recent Results (from the past 12 hour(s))     CBC WITH AUTOMATED DIFF          Collection Time: 09/15/19  8:36 AM         Result  Value  Ref Range            WBC  5.3  3.6 - 11.0 K/uL       RBC  3.73 (L)  3.80 - 5.20 M/uL       HGB  10.3 (L)  11.5 - 16.0 g/dL       HCT  31.8 (L)  35.0 - 47.0 %       MCV  85.3  80.0 - 99.0 FL       MCH  27.6  26.0 - 34.0 PG       MCHC  32.4  30.0 - 36.5 g/dL       RDW  16.0 (H)  11.5 - 14.5 %       PLATELET  275  150 - 400 K/uL       MPV  11.6  8.9 - 12.9 FL       NRBC  0.0  0 PER 100 WBC       ABSOLUTE NRBC  0.00  0.00 - 0.01 K/uL       NEUTROPHILS  48  32 - 75 %       LYMPHOCYTES  45  12 - 49 %       MONOCYTES  6  5 - 13 %       EOSINOPHILS  1  0 - 7 %       BASOPHILS  0  0 - 1 %       IMMATURE GRANULOCYTES  0  0.0 - 0.5 %       ABS. NEUTROPHILS  2.5  1.8 - 8.0 K/UL       ABS. LYMPHOCYTES  2.4  0.8 - 3.5 K/UL       ABS. MONOCYTES  0.3  0.0 - 1.0 K/UL       ABS. EOSINOPHILS  0.1  0.0 - 0.4 K/UL       ABS. BASOPHILS  0.0  0.0 - 0.1 K/UL       ABS. IMM. GRANS.  0.0  0.00 - 0.04 K/UL            DF  AUTOMATED              Radiologic Studies -      US TRANSVAGINAL       Final Result     IMPRESSION:  Normal transvaginal ultrasound examination.                                          [place] PELV NON OBS    (Results Pending)        US Transvaginal      Result Date: 09/15/2019   IMPRESSION:  Normal transvaginal ultrasound examination.         Medical Decision Making     I am the first provider for this patient.      I reviewed the vital signs, available nursing  notes, past medical history, past surgical history, family history and social history.      Vital Signs-Reviewed the patient's vital signs.   Patient Vitals for the past 24 hrs:            Temp  Pulse  Resp  BP  SpO2            09/15/19 0959  --  --  --  --  100 %            09/15/19 0938  --  --  --  --  100 %     09/15/19 0937  --  --  --  120/74  --            09/15/19 0753  98.7 ??F (37.1 ??C)  60  18  (!) 159/85  100 %        Pulse Oximetry Analysis - 100% on RA     Records Reviewed: Nursing Notes and Old Medical Records      Provider Notes (Medical Decision Making):    48 year old female presents with dysfunctional uterine bleeding and pelvic cramping for the past 2 days.  Differential includes perimenopausal bleeding, uterine fibroids, pregnancy, ectopic pregnancy,  and low suspicion for intrauterine mass.  Will check basic labs and ultrasound her uterus and reassess.      ED Course:    Initial assessment performed. The patients presenting problems have been discussed, and they are in agreement with the care plan formulated and outlined with them.  I have encouraged them to ask questions as they arise throughout their visit.      Progress Note   9:54 AM   I have re-evaluated pt and she states that her pain is down to a 3 out of 10.  It initially was a 7-8 out of 10.  I informed her of her normal labs and ultrasound results.  Will discharge  with OB/GYN follow-up and a prescription for naproxen since she tolerated the Toradol in the emergency department.      Progress Note:    Updated pt on all returned results and findings. Discussed the importance of proper follow up as referred below along with return precautions. Pt in agreement with the care plan and expresses agreement with and understanding of all items discussed.      Disposition:   Discharge Note:   The pt is ready for discharge. The pt's signs, symptoms, diagnosis, and discharge instructions have been discussed and pt has conveyed their understanding.  The pt is to follow up as recommended or return to ER should their symptoms worsen. Plan has been  discussed and pt is in agreement.      PLAN:   1.      Current Discharge Medication List              START taking these medications          Details        naproxen (NAPROSYN) 500 mg tablet  Take 1 Tab by mouth every twelve (12) hours as needed for Pain.   Qty: 20 Tab, Refills:  0                      2.      Follow-up Information               Follow up With  Specialties  Details  Why  Contact Info              [facility]  Schedule an appointment as soon as possible for a visit    [address], [place]   [phone]              [facility] EMERGENCY DEPT  Emergency Medicine    If symptoms worsen  [place]   [phone]             Return to ED if worse  Diagnosis        Clinical Impression:       1.  DUB (dysfunctional uterine bleeding)         2.  Perimenopausal symptom                    Please note that this dictation was completed with Dragon, Acupuncturist.  Quite often unanticipated grammatical, syntax, homophones, and other interpretive  errors are inadvertently transcribed by the computer software.  Please disregard these errors.  Additionally, please excuse any errors that have escaped final proofreading.

## 2019-09-15 NOTE — ED Notes (Signed)
Pt does not have to urinate at this time.

## 2019-09-15 NOTE — ED Notes (Signed)
 Pt is alert and oriented x 4, RR even and unlabored, skin is warm and dry. Assessment completed and pt updated on plan of care.  Call bell in reach.       Emergency Department Nursing Plan of Care       The Nursing Plan of Care is developed from the Nursing assessment and Emergency Department Attending provider initial evaluation.  The plan of care may be reviewed in the "ED Provider note".    The Plan of Care was developed with the following considerations:   Patient / Family readiness to learn indicated ab:czmajopszi understanding  Persons(s) to be included in education: patient  Barriers to Learning/Limitations:No    Signed     Annabella Bern, RN    09/15/2019   7:57 AM

## 2019-09-15 NOTE — ED Notes (Signed)
Pt provided ginger ale and crackers. Will order a meal tray.

## 2019-09-15 NOTE — ED Notes (Signed)
Intermittent vaginal bleeding over the past 2 months. Pt reports going through 4 pads in the past 24 hours with increased pain.

## 2019-09-15 NOTE — Progress Notes (Signed)
ER referral for assistance with transportation home.  CM met with patient who stated that she had just received word that her son will be able to provide transportation home from ER.  Melvern Sample, BSW/CM  [phone]

## 2019-09-15 NOTE — ED Notes (Signed)
Pt in ultrasound

## 2019-09-15 NOTE — ED Notes (Signed)
Discharge instructions were given to the patient by Tiffany Skok, RN.     The patient left the Emergency Department ambulatory, alert and oriented and in no acute distress with 1 prescriptions. The patient was encouraged to call or return to the ED for worsening issues or problems and was encouraged to schedule a follow up appointment for continuing care.     The patient verbalized understanding of discharge instructions and prescriptions, all questions were answered. The patient has no further concerns at this time.

## 2019-09-15 NOTE — ED Notes (Signed)
Pt is a hard IV stick. Dr. Nelda Marseille made aware labs were the only thing we were able to obtain.

## 2019-09-15 NOTE — ED Notes (Signed)
Pt back from ultrasound reporting feeling much better.

## 2019-09-15 NOTE — ED Notes (Signed)
Pt provided breakfast and was eating while going over discharge instructions.

## 2019-09-15 NOTE — ED Notes (Signed)
Pt is alert and oriented x 4, RR even and unlabored, skin is warm and dry. Assessment completed and pt updated on plan of care.  Call bell in reach.       Emergency Department Nursing Plan of Care       The Nursing Plan of Care is developed from the Nursing assessment and Emergency Department Attending provider initial evaluation.  The plan of care may be reviewed in the ???ED Provider note???.    The Plan of Care was developed with the following considerations:   Patient / Family readiness to learn indicated [id number] understanding  Persons(s) to be included in education: patient  Barriers to Learning/Limitations:No    Signed     Liane Comber, RN    09/15/2019   7:57 AM

## 2020-01-01 ENCOUNTER — Encounter

## 2020-02-17 ENCOUNTER — Encounter

## 2020-02-23 ENCOUNTER — Inpatient Hospital Stay: Payer: MEDICAID | Attending: Physician Assistant

## 2020-06-07 ENCOUNTER — Emergency Department: Admit: 2020-06-07 | Payer: MEDICAID

## 2020-06-07 ENCOUNTER — Inpatient Hospital Stay
Admit: 2020-06-07 | Discharge: 2020-06-08 | Disposition: A | Discharge status: Home / Self Care | Payer: MEDICAID | Attending: Emergency Medicine

## 2020-06-07 DIAGNOSIS — U071 COVID-19: Secondary | ICD-10-CM

## 2020-06-07 LAB — COVID-19, RAPID: SARS-CoV-2, Rapid: DETECTED — CR

## 2020-06-07 LAB — COVID-19 RAPID TEST: COVID-19 rapid test: DETECTED — CR

## 2020-06-07 MED ORDER — IBUPROFEN 400 MG TAB
400 mg | ORAL | Status: AC
Start: 2020-06-07 — End: 2020-06-07
  Administered 2020-06-07: 23:00:00 via ORAL

## 2020-06-07 MED FILL — IBU 400 MG TABLET: 400 mg | ORAL | Qty: 1

## 2020-06-07 NOTE — ED Notes (Signed)
Patient prepared for discharge, Husband in the waiting room. Patient had incontinence, given gown and prepared for discharge

## 2020-06-07 NOTE — ED Notes (Signed)
Bedside and Verbal shift change report given to Marcie Bal, Therapist, sports (oncoming nurse) by Lenis Dickinson, RN (offgoing nurse). Report included the following information SBAR, ED Summary, MAR and Recent Results.

## 2020-06-07 NOTE — ED Provider Notes (Signed)
ED Provider Notes by Lorenda Cahill, MD at 06/07/20 2002                Author: Lorenda Cahill, MD  Service: --  Author Type: Physician       Filed: 06/07/20 2328  Date of Service: 06/07/20 2002  Status: Signed          Editor: Lorenda Cahill, MD (Physician)               EMERGENCY DEPARTMENT HISTORY AND PHYSICAL EXAM           Date: 06/07/2020   Patient Name: Anita Bell   Patient Age and Sex: 49 y.o.  female         History of Presenting Illness          Chief Complaint       Patient presents with        ?  Fever        ?  Fatigue           History Provided By: Patient      HPI: Anita Bell is a 49 year old  history asthma presenting with fatigue and fever.  She reports approximately 24 hours of cough, fever, fatigue, diffuse myalgias and arthralgias, loss of taste and smell.  She versus mild cough without productive mucus.  She denies any dyspnea, wheezing,  does not believe she is having an asthma exacerbation.  She denies any nausea or vomiting or diarrhea though she does report decreased appetite.  No known Covid exposure.  She has not received a Covid vaccine.      There are no other complaints, changes, or physical findings at this time.      PCP: None        No current facility-administered medications on file prior to encounter.          Current Outpatient Medications on File Prior to Encounter          Medication  Sig  Dispense  Refill           ?  ketorolac (TORADOL) 10 mg tablet  Take 1 Tab by mouth every six (6) hours as needed for Pain.  16 Tab  0     ?  methadone (DOLOPHINE) 10 mg/mL solution  Take 85 mg by mouth daily. Indications: symptoms from stopping treatment with opioid drugs         ?  rizatriptan (MAXALT-MLT) 10 mg disintegrating tablet           ?  topiramate (TOPAMAX) 50 mg tablet                 ?  buprenorphine-naloxone (SUBOXONE) 8-2 mg film sublingaul film  1 Film by SubLINGual route three (3) times daily.               ?  DULoxetine (CYMBALTA) 60 mg capsule  Take 60 mg  by mouth daily.         ?  famotidine (PEPCID) 20 mg tablet  Take 20 mg by mouth two (2) times a day.         ?  gabapentin (NEURONTIN) 800 mg tablet  Take 800 mg by mouth three (3) times daily.         ?  mirtazapine (REMERON SOL-TAB) 15 mg disintegrating tablet  Take 15 mg by mouth nightly.               ?  risperiDONE (RisperDAL) 1 mg tablet  Take 1 mg by mouth daily.                 Past History        Past Medical History:     Past Medical History:        Diagnosis  Date         ?  ADHD       ?  Asthma       ?  Depression       ?  Fibromyalgia       ?  Gastroenteritis       ?  Hydradenitis            suppurativa         ?  Osteoarthritis       ?  Other ill-defined conditions(799.89)            chronic back pain         ?  Psychiatric disorder             Past Surgical History:     Past Surgical History:         Procedure  Laterality  Date          ?  HX GYN              tubes tided           Family History:     Family History         Problem  Relation  Age of Onset          ?  Alcohol abuse  Neg Hx       ?  Arthritis-rheumatoid  Neg Hx       ?  Asthma  Neg Hx       ?  Bleeding Prob  Neg Hx       ?  Cancer  Neg Hx       ?  Diabetes  Neg Hx       ?  Elevated Lipids  Neg Hx       ?  Headache  Neg Hx       ?  Heart Disease  Neg Hx       ?  Hypertension  Neg Hx       ?  Lung Disease  Neg Hx       ?  Migraines  Neg Hx       ?  Psychiatric Disorder  Neg Hx       ?  Stroke  Neg Hx            ?  Mental Retardation  Neg Hx             Social History:     Social History          Tobacco Use         ?  Smoking status:  Never Smoker     ?  Smokeless tobacco:  Never Used       Substance Use Topics         ?  Alcohol use:  No     ?  Drug use:  Not Currently              Types:  Prescription, Opiates, Cocaine, Heroin           Allergies:     Allergies        Allergen  Reactions         ?  Aspirin  Nausea and Vomiting         ?  Aspirin  Hives                Review of Systems     Review of Systems    Constitutional: Positive  for appetite change, fatigue  and fever.    HENT: Positive for rhinorrhea and sore throat .     Eyes: Negative.     Respiratory: Positive for cough. Negative for shortness of breath.     Cardiovascular: Negative.  Negative for chest pain.    Gastrointestinal: Negative.     Endocrine: Negative.     Genitourinary: Negative.     Musculoskeletal: Positive for arthralgias and myalgias .    Skin: Negative.     Allergic/Immunologic: Negative.     Neurological: Negative.     Hematological: Negative.     Psychiatric/Behavioral: Negative.           Physical Exam     Physical Exam    General: Alert, no acute distress, uncomfortable appearing   Skin: Warm, dry   Head: Normocephalic, atraumatic   Eye: EOMI, normal conjunctiva   Ears, Nose, Mouth and Throat: Oral mucosa moist, no pharyngeal erythema or exudate   Cardiovascular: No murmur, normal peripheral perfusion, regular rhythm   Pulmonary: Normal respiratory effort, normal breath sounds, good, symmetric air movement   Gastrointestinal: Soft, nontender, nondistended   Musculoskeletal: No swelling, no deformity   Neurological: Alert and oriented to person, place, situation. Normal speech observed. Moving all extremities symmetrically.   Psychiatric: Cooperate, appropriate affect        Diagnostic Study Results        Labs -     Recent Results (from the past 12 hour(s))     COVID-19 RAPID TEST          Collection Time: 06/07/20  5:54 PM         Result  Value  Ref Range            Specimen source  Nasopharyngeal               COVID-19 rapid test  Detected (AA)  NOTD             Radiologic Studies -      XR CHEST PORT       Final Result          No acute findings                      CT Results   (Last 48 hours)          None                 CXR Results   (Last 48 hours)                                    06/07/20 1809    XR CHEST PORT  Final result            Impression:           No acute findings                              Narrative:    EXAM:  XR CHEST PORT  INDICATION: Covid             COMPARISON: Chest radiograph 07/21/2019             TECHNIQUE: Semiupright portable chest AP view             FINDINGS:              Cardiac mediastinal silhouette within normal limits. Pulmonary vascular      congestion without overt edema. Hypoinflated lungs. No focal consolidation,      sizable pleural effusion, no discernible pneumothorax                                       Medical Decision Making     I am the first provider for this patient.      I reviewed the vital signs, available nursing notes, past medical history, past surgical history, family history and social history.      Vital Signs-Reviewed the patient's vital signs.   Patient Vitals for the past 12 hrs:            Temp  Pulse  Resp  BP  SpO2            06/07/20 1950  99 ??F (37.2 ??C)  82  18  (!) 145/70  99 %            06/07/20 1851  --  --  --  --  97 %            06/07/20 1731  100.4 ??F (38 ??C)  81  15  (!) 142/74  97 %           Records Reviewed: Nursing Notes and Old Medical Records      Provider Notes (Medical Decision Making):    48 year old history of asthma presenting with symptoms concerning for acute respiratory infection      Differential is bacterial pneumonia, viral pneumonia, pneumonia due to COVID-19      She is uncomfortable appearing but well perfused and in no respiratory distress satting 97% on room air without tachypnea or accessory muscle use.  No wheezing.  Obtain chest x-ray to evaluate for consolidation, pneumothorax, effusion; this was obtained  and was normal.  Rapid Covid obtained and was positive.  She is not requiring oxygen and would not benefit from steroids.  She is given return precautions for worsening dyspnea and instructed to purchase home oximeter.      ED Course:    Initial assessment performed. The patients presenting problems have been discussed, and they are in agreement with the care plan formulated and outlined with them.  I have encouraged them to ask questions as they arise  throughout their visit.        ED Course as of Jun 07 2326       Tue Jun 07, 2020        1828  Positive Covid, chest x-ray with no consolidation, normal vital signs here.  Patient will be discharged     [WB]              ED Course User Index   [WB] Lorenda Cahill, MD           Disposition:   Discharge Note:   The patient has been re-evaluated and is ready for discharge. Reviewed available results with patient. Counseled patient on diagnosis and care plan. Patient has expressed  understanding, and all questions  have been answered. Patient agrees with plan and agrees to follow up as recommended, or to return to the ED if their symptoms worsen. Discharge instructions have been provided and explained to the patient, along with reasons to return to the ED.        PLAN:     Discharge Medication List as of 06/07/2020  7:17 PM               2.      Follow-up Information      None             3.  Return to ED if worse         Diagnosis        Clinical Impression:       1.  COVID-19 virus infection            Attestations:      Lorenda Cahill, M.D.              Please note that this dictation was completed with Dragon, the computer voice recognition software.  Quite often unanticipated grammatical, syntax, homophones, and other interpretive errors are inadvertently transcribed by the computer software.  Please  disregard these errors.  Please excuse any errors that have escaped final proofreading.  Thank you.

## 2020-06-07 NOTE — ED Notes (Signed)
Positive COVID MD Bost made aware

## 2020-06-08 NOTE — Progress Notes (Signed)
06/09/20 Attempted patient outreach for COVID follow up call per protocol. Unable to contact patient. Per chart review, patient aware of COVID status while in ED. Resolving TOC episode. APM

## 2020-06-14 ENCOUNTER — Emergency Department: Admit: 2020-06-14 | Payer: MEDICAID

## 2020-06-14 ENCOUNTER — Inpatient Hospital Stay
Admit: 2020-06-14 | Discharge: 2020-06-14 | Disposition: A | Discharge status: Home / Self Care | Payer: MEDICAID | Attending: Emergency Medicine

## 2020-06-14 DIAGNOSIS — U071 COVID-19: Secondary | ICD-10-CM

## 2020-06-14 LAB — COMPREHENSIVE METABOLIC PANEL
ALT: 38 U/L (ref 12–78)
AST: 68 U/L — ABNORMAL HIGH (ref 15–37)
Albumin/Globulin Ratio: 0.7 — ABNORMAL LOW (ref 1.1–2.2)
Albumin: 3.3 g/dL — ABNORMAL LOW (ref 3.5–5.0)
Alkaline Phosphatase: 48 U/L (ref 45–117)
Anion Gap: 14 mmol/L (ref 5–15)
BUN: 46 MG/DL — ABNORMAL HIGH (ref 6–20)
Bun/Cre Ratio: 16 (ref 12–20)
CO2: 21 mmol/L (ref 21–32)
Calcium: 8.7 MG/DL (ref 8.5–10.1)
Chloride: 106 mmol/L (ref 97–108)
Creatinine: 2.86 MG/DL — ABNORMAL HIGH (ref 0.55–1.02)
EGFR IF NonAfrican American: 18 mL/min/{1.73_m2} — ABNORMAL LOW (ref >60)
GFR African American: 21 mL/min/{1.73_m2} — ABNORMAL LOW (ref >60)
Globulin: 4.8 g/dL — ABNORMAL HIGH (ref 2.0–4.0)
Glucose: 113 mg/dL — ABNORMAL HIGH (ref 65–100)
Potassium: 3.8 mmol/L (ref 3.5–5.1)
Sodium: 141 mmol/L (ref 136–145)
Total Bilirubin: 0.3 MG/DL (ref 0.2–1.0)
Total Protein: 8.1 g/dL (ref 6.4–8.2)

## 2020-06-14 LAB — CBC WITH AUTO DIFFERENTIAL
Basophils %: 0 % (ref 0–1)
Basophils Absolute: 0 10*3/uL (ref 0.0–0.1)
Eosinophils %: 0 % (ref 0–7)
Eosinophils Absolute: 0 10*3/uL (ref 0.0–0.4)
Granulocyte Absolute Count: 0 10*3/uL
Hematocrit: 43.5 % (ref 35.0–47.0)
Hemoglobin: 14.2 g/dL (ref 11.5–16.0)
Immature Granulocytes: 0 %
Lymphocytes %: 29 % (ref 12–49)
Lymphocytes Absolute: 1.9 10*3/uL (ref 0.8–3.5)
MCH: 27.6 PG (ref 26.0–34.0)
MCHC: 32.6 g/dL (ref 30.0–36.5)
MCV: 84.6 FL (ref 80.0–99.0)
MPV: 10.4 FL (ref 8.9–12.9)
Monocytes %: 3 % — ABNORMAL LOW (ref 5–13)
Monocytes Absolute: 0.2 10*3/uL (ref 0.0–1.0)
NRBC Absolute: 0 10*3/uL (ref 0.00–0.01)
Neutrophils %: 68 % (ref 32–75)
Neutrophils Absolute: 4.4 10*3/uL (ref 1.8–8.0)
Nucleated RBCs: 0 PER 100 WBC
Platelets: 151 10*3/uL (ref 150–400)
RBC: 5.14 M/uL (ref 3.80–5.20)
RDW: 14.3 % (ref 11.5–14.5)
WBC: 6.5 10*3/uL (ref 3.6–11.0)

## 2020-06-14 LAB — LIPASE
Lipase: 160 U/L (ref 73–393)
Lipase: 160 U/L (ref 73–393)

## 2020-06-14 LAB — CBC WITH AUTOMATED DIFF
ABS. BASOPHILS: 0 10*3/uL (ref 0.0–0.1)
ABS. EOSINOPHILS: 0 10*3/uL (ref 0.0–0.4)
ABS. IMM. GRANS.: 0 10*3/uL
ABS. LYMPHOCYTES: 1.9 10*3/uL (ref 0.8–3.5)
ABS. MONOCYTES: 0.2 10*3/uL (ref 0.0–1.0)
ABS. NEUTROPHILS: 4.4 10*3/uL (ref 1.8–8.0)
ABSOLUTE NRBC: 0 10*3/uL (ref 0.00–0.01)
BASOPHILS: 0 % (ref 0–1)
EOSINOPHILS: 0 % (ref 0–7)
HCT: 43.5 % (ref 35.0–47.0)
HGB: 14.2 g/dL (ref 11.5–16.0)
IMMATURE GRANULOCYTES: 0 %
LYMPHOCYTES: 29 % (ref 12–49)
MCH: 27.6 PG (ref 26.0–34.0)
MCHC: 32.6 g/dL (ref 30.0–36.5)
MCV: 84.6 FL (ref 80.0–99.0)
MONOCYTES: 3 % — ABNORMAL LOW (ref 5–13)
MPV: 10.4 FL (ref 8.9–12.9)
NEUTROPHILS: 68 % (ref 32–75)
NRBC: 0 PER 100 WBC
PLATELET: 151 10*3/uL (ref 150–400)
RBC: 5.14 M/uL (ref 3.80–5.20)
RDW: 14.3 % (ref 11.5–14.5)
WBC: 6.5 10*3/uL (ref 3.6–11.0)

## 2020-06-14 LAB — METABOLIC PANEL, COMPREHENSIVE
A-G Ratio: 0.7 — ABNORMAL LOW (ref 1.1–2.2)
ALT (SGPT): 38 U/L (ref 12–78)
AST (SGOT): 68 U/L — ABNORMAL HIGH (ref 15–37)
Albumin: 3.3 g/dL — ABNORMAL LOW (ref 3.5–5.0)
Alk. phosphatase: 48 U/L (ref 45–117)
Anion gap: 14 mmol/L (ref 5–15)
BUN/Creatinine ratio: 16 (ref 12–20)
BUN: 46 MG/DL — ABNORMAL HIGH (ref 6–20)
Bilirubin, total: 0.3 MG/DL (ref 0.2–1.0)
CO2: 21 mmol/L (ref 21–32)
Calcium: 8.7 MG/DL (ref 8.5–10.1)
Chloride: 106 mmol/L (ref 97–108)
Creatinine: 2.86 MG/DL — ABNORMAL HIGH (ref 0.55–1.02)
GFR est AA: 21 mL/min/{1.73_m2} — ABNORMAL LOW (ref >60)
GFR est non-AA: 18 mL/min/{1.73_m2} — ABNORMAL LOW (ref >60)
Globulin: 4.8 g/dL — ABNORMAL HIGH (ref 2.0–4.0)
Glucose: 113 mg/dL — ABNORMAL HIGH (ref 65–100)
Potassium: 3.8 mmol/L (ref 3.5–5.1)
Protein, total: 8.1 g/dL (ref 6.4–8.2)
Sodium: 141 mmol/L (ref 136–145)

## 2020-06-14 MED ORDER — PROMETHAZINE 50 MG RECTAL SUPPOSITORY
50 mg | Freq: Four times a day (QID) | RECTAL | 0 refills | Status: AC | PRN
Start: 2020-06-14 — End: 2020-06-28

## 2020-06-14 MED ORDER — SODIUM CHLORIDE 0.9% BOLUS IV
0.9 % | Freq: Once | INTRAVENOUS | Status: AC
Start: 2020-06-14 — End: 2020-06-14
  Administered 2020-06-14: 05:00:00 via INTRAVENOUS

## 2020-06-14 MED ORDER — DEXTROSE 5% IN NORMAL SALINE IV
Freq: Once | INTRAVENOUS | Status: AC
Start: 2020-06-14 — End: 2020-06-14
  Administered 2020-06-14: 04:00:00 via INTRAVENOUS

## 2020-06-14 MED ORDER — ONDANSETRON (PF) 4 MG/2 ML INJECTION
4 mg/2 mL | INTRAMUSCULAR | Status: DC | PRN
Start: 2020-06-14 — End: 2020-06-14
  Administered 2020-06-14 (×2): via INTRAVENOUS

## 2020-06-14 MED FILL — SODIUM CHLORIDE 0.9 % IV: INTRAVENOUS | Qty: 1000

## 2020-06-14 MED FILL — DEXTROSE 5% IN NORMAL SALINE IV: INTRAVENOUS | Qty: 1000

## 2020-06-14 MED FILL — ONDANSETRON (PF) 4 MG/2 ML INJECTION: 4 mg/2 mL | INTRAMUSCULAR | Qty: 2

## 2020-06-14 NOTE — ED Notes (Signed)
..  Discharge summary and discharge medications reviewed with patient and appropriate educational materials and side effects teaching were provided.  patient  Given 0 paper prescriptions and 1 electronic prescriptions sent to pt's listed pharmacy.Patient  verbalized understanding of the importance of discussing medications with his or her physician or clinic they will be following up with. No si/s of acute distress prior to discharge.Patient offered wheelchair from treatment area to hospital entrance, patient declined wheelchair.

## 2020-06-14 NOTE — ED Notes (Signed)
Patient ambulatory to bathroom.

## 2020-06-14 NOTE — ED Provider Notes (Signed)
ED Provider Notes by Windy Kalata, MD at 06/14/20 0015                Author: Windy Kalata, MD  Service: --  Author Type: Physician       Filed: 06/14/20 0240  Date of Service: 06/14/20 0015  Status: Signed          Editor: Windy Kalata, MD (Physician)               EMERGENCY DEPARTMENT HISTORY AND PHYSICAL EXAM           Date: 06/14/2020   Patient Name: Anita Bell        History of Presenting Illness          Chief Complaint       Patient presents with        ?  Positive For Covid-19        ?  Vomiting           History Provided By: Patient      HPI: Anita Bell,  49 y.o. female with past medical history of opiate use disorder on methadone presenting  today with vomiting for the past 2 days in the setting of a recent Covid diagnosis.  The patient that she was diagnosed with Covid about 2 days ago.  She has been having nausea and vomiting since that time and has not been able to keep down her medications.   She notes that she typically takes gabapentin and methadone daily and so she is feeling poorly due to not being able to take her medicines.  She says that she has been having coughing and also has had multiple episodes of emesis.  She does not have any  abdominal pain, she only has abdominal cramping when she is vomiting.  The patient was unvaccinated.      There are no other complaints, changes, or physical findings at this time.      PCP: None        No current facility-administered medications on file prior to encounter.          Current Outpatient Medications on File Prior to Encounter          Medication  Sig  Dispense  Refill           ?  buprenorphine-naloxone (SUBOXONE) 8-2 mg film sublingaul film  1 Film by SubLINGual route three (3) times daily.         ?  gabapentin (NEURONTIN) 800 mg tablet  Take 800 mg by mouth three (3) times daily.         ?  ketorolac (TORADOL) 10 mg tablet  Take 1 Tab by mouth every six (6) hours as needed for Pain. (Patient not taking: Reported on  06/14/2020)  16 Tab  0           ?  methadone (DOLOPHINE) 10 mg/mL solution  Take 85 mg by mouth daily. Indications: symptoms from stopping treatment with opioid drugs               ?  rizatriptan (MAXALT-MLT) 10 mg disintegrating tablet   (Patient not taking: Reported on 06/14/2020)         ?  topiramate (TOPAMAX) 50 mg tablet   (Patient not taking: Reported on 06/14/2020)         ?  DULoxetine (CYMBALTA) 60 mg capsule  Take 60 mg by mouth daily. (Patient not taking: Reported on 06/14/2020)         ?  famotidine (PEPCID) 20 mg tablet  Take 20 mg by mouth two (2) times a day. (Patient not taking: Reported on 06/14/2020)         ?  mirtazapine (REMERON SOL-TAB) 15 mg disintegrating tablet  Take 15 mg by mouth nightly. (Patient not taking: Reported on 06/14/2020)               ?  risperiDONE (RisperDAL) 1 mg tablet  Take 1 mg by mouth daily. (Patient not taking: Reported on 06/14/2020)                 Past History        Past Medical History:     Past Medical History:        Diagnosis  Date         ?  ADHD       ?  Asthma       ?  Depression       ?  Fibromyalgia       ?  Gastroenteritis       ?  Hydradenitis            suppurativa         ?  Osteoarthritis       ?  Other ill-defined conditions(799.89)            chronic back pain         ?  Psychiatric disorder             Past Surgical History:     Past Surgical History:         Procedure  Laterality  Date          ?  HX GYN              tubes tided           Family History:     Family History         Problem  Relation  Age of Onset          ?  Alcohol abuse  Neg Hx       ?  Arthritis-rheumatoid  Neg Hx       ?  Asthma  Neg Hx       ?  Bleeding Prob  Neg Hx       ?  Cancer  Neg Hx       ?  Diabetes  Neg Hx       ?  Elevated Lipids  Neg Hx       ?  Headache  Neg Hx       ?  Heart Disease  Neg Hx       ?  Hypertension  Neg Hx       ?  Lung Disease  Neg Hx       ?  Migraines  Neg Hx       ?  Psychiatric Disorder  Neg Hx       ?  Stroke  Neg Hx            ?  Mental Retardation   Neg Hx             Social History:     Social History          Tobacco Use         ?  Smoking status:  Never Smoker     ?  Smokeless tobacco:  Never Used  Substance Use Topics         ?  Alcohol use:  No     ?  Drug use:  Not Currently              Types:  Prescription, Opiates, Cocaine, Heroin           Allergies:     Allergies        Allergen  Reactions         ?  Aspirin  Nausea and Vomiting         ?  Aspirin  Hives                Review of Systems     Constitutional: +  fever   Skin: No  rash   HEENT: No  nasal congestion   Resp: + cough   CV: No chest pain   GI: + vomiting   GU: No dysuria   MSK: No joint pain   Neuro: No numbness   Psych: No anxiety           Physical Exam        Patient Vitals for the past 12 hrs:            Temp  Pulse  Resp  BP  SpO2            06/14/20 0230  --  72  16  (!) 111/59  97 %            06/14/20 0130  --  --  --  --  96 %     06/14/20 0108  --  --  --  107/76  95 %     06/14/20 0049  --  78  --  --  98 %            06/14/20 0007  99.5 ??F (37.5 ??C)  94  16  (!) 98/56  93 %        General: alert, No acute distress   Eyes: EOMI, normal conjunctiva   ENT: moist mucous membranes.   Neck: Active, full ROM of neck.   Skin: No rashes.no jaundice               Lungs: Equal chest expansion.no respiratory distress. clear to auscultation bilaterally  No accessory muscle usage   Heart: regular rate     no peripheral edema    2+ radial pulses and DPs bilaterally   Abd:  non distended soft, nontender.No rebound tenderness .No guarding   Back: Full ROM   MSK: Full, active ROM in all 4 extremities.    Neuro: Alert and oriented to Person, Place, Time and Situation; normal speech;    Psych: Cooperative with exam; Appropriate mood and affect                   Diagnostic Study Results        Labs -         Recent Results (from the past 12 hour(s))     CBC WITH AUTOMATED DIFF          Collection Time: 06/14/20 12:21 AM         Result  Value  Ref Range            WBC  6.5  3.6 - 11.0 K/uL        RBC  5.14  3.80 - 5.20 M/uL       HGB  14.2  11.5 - 16.0 g/dL       HCT  43.5  35.0 - 47.0 %       MCV  84.6  80.0 - 99.0 FL       MCH  27.6  26.0 - 34.0 PG       MCHC  32.6  30.0 - 36.5 g/dL       RDW  14.3  11.5 - 14.5 %       PLATELET  151  150 - 400 K/uL       MPV  10.4  8.9 - 12.9 FL       NRBC  0.0  0 PER 100 WBC       ABSOLUTE NRBC  0.00  0.00 - 0.01 K/uL       NEUTROPHILS  68  32 - 75 %       LYMPHOCYTES  29  12 - 49 %       MONOCYTES  3 (L)  5 - 13 %       EOSINOPHILS  0  0 - 7 %       BASOPHILS  0  0 - 1 %       IMMATURE GRANULOCYTES  0  %       ABS. NEUTROPHILS  4.4  1.8 - 8.0 K/UL       ABS. LYMPHOCYTES  1.9  0.8 - 3.5 K/UL       ABS. MONOCYTES  0.2  0.0 - 1.0 K/UL       ABS. EOSINOPHILS  0.0  0.0 - 0.4 K/UL       ABS. BASOPHILS  0.0  0.0 - 0.1 K/UL       ABS. IMM. GRANS.  0.0  K/UL       DF  SMEAR SCANNED          RBC COMMENTS  NORMOCYTIC, NORMOCHROMIC          METABOLIC PANEL, COMPREHENSIVE          Collection Time: 06/14/20 12:21 AM         Result  Value  Ref Range            Sodium  141  136 - 145 mmol/L       Potassium  3.8  3.5 - 5.1 mmol/L       Chloride  106  97 - 108 mmol/L       CO2  21  21 - 32 mmol/L       Anion gap  14  5 - 15 mmol/L       Glucose  113 (H)  65 - 100 mg/dL       BUN  46 (H)  6 - 20 MG/DL       Creatinine  2.86 (H)  0.55 - 1.02 MG/DL       BUN/Creatinine ratio  16  12 - 20         GFR est AA  21 (L)  >60 ml/min/1.43m       GFR est non-AA  18 (L)  >60 ml/min/1.767m      Calcium  8.7  8.5 - 10.1 MG/DL       Bilirubin, total  0.3  0.2 - 1.0 MG/DL       ALT (SGPT)  38  12 - 78 U/L       AST (SGOT)  68 (H)  15 - 37 U/L       Alk.  phosphatase  48  45 - 117 U/L       Protein, total  8.1  6.4 - 8.2 g/dL       Albumin  3.3 (L)  3.5 - 5.0 g/dL       Globulin  4.8 (H)  2.0 - 4.0 g/dL       A-G Ratio  0.7 (L)  1.1 - 2.2         LIPASE          Collection Time: 06/14/20 12:21 AM         Result  Value  Ref Range            Lipase  160  73 - 393 U/L           Radiologic Studies -      XR  CHEST PORT       Final Result     No acute process identified.                            CT Results   (Last 48 hours)          None                 CXR Results   (Last 48 hours)                                    06/14/20 0102    XR CHEST PORT  Final result            Impression:    No acute process identified.                                      Narrative:    Clinical history: Cough      INDICATION:   Cough      COMPARISON: 06/08/2019             FINDINGS:      AP portable upright view of the chest demonstrates a stable  cardiopericardial      silhouette.There is no pleural effusion..There is no focal consolidation..There      is no pneumothorax                             Medical Decision Making     I am the first provider for this patient.      I reviewed the vital signs, available nursing notes, past medical history, past surgical history, family history and social history.         Vital Signs-Reviewed the patient's vital signs.   Patient Vitals for the past 12 hrs:            Temp  Pulse  Resp  BP  SpO2            06/14/20 0230  --  72  16  (!) 111/59  97 %            06/14/20 0130  --  --  --  --  96 %     06/14/20 0108  --  --  --  107/76  95 %     06/14/20 0049  --  78  --  --  98 %  06/14/20 0007  99.5 ??F (37.5 ??C)  94  16  (!) 98/56  93 %           Provider Notes (Medical Decision Making):       Differential Diagnosis: Opiate withdrawal, dehydration, electrolyte abnormality, Covid infection      Initial Plan: Will treat the patient with IV fluids, antiemetics, obtain laboratory studies and reassess.  Patient certainly does appear to be dehydrated, has borderline low blood pressure, pulse is 94.  Will treat with IV fluids and antiemetics and reassess.      ED Course:    Initial assessment performed. The patients presenting problems have been discussed, and they are in agreement with the care plan formulated and outlined with them.  I have encouraged them to ask questions as they arise throughout  their visit.        ED Course as of Jun 15 239       Tue Jun 14, 2020        0135  On my interpretation of the patient's laboratory studies elevated creatinine at 2.86, otherwise unremarkable labs.     [NW]     0135  On my interpretation of the patient's chest x-ray there is no evidence of acute abnormality.     [NW]     0137  After IV fluids x1 L the patient's blood pressures improved significantly.  She continues have vomiting, so we will continue treating with ondansetron  and reassess after additional IV fluids.     [NW]     0236  Patient feels improved after second liter of IV fluids, she is able to tolerate p.o. intake, we discussed her findings, and ultimately she is discharged  with return precautions.     [NW]              ED Course User Index   [NW] Windy Kalata, MD           I, Windy Kalata, MD, am the attending of record for this patient encounter.         Dispo: Discharged. The patient has been re-evaluated and is ready for discharge. Reviewed available results with patient. Counseled patient on diagnosis and care plan. Patient has expressed understanding,  and all questions have been answered. Patient agrees with plan and agrees to follow up as recommended, or to return to the ED if their symptoms worsen. Discharge instructions have been provided and explained to the patient, along with reasons to return  to the ED.          PLAN:     Current Discharge Medication List              START taking these medications          Details        promethazine (PHENERGAN) 50 mg suppository  Insert 0.5 Suppositories into rectum every six (6) hours as needed for Nausea for up to  14 days.   Qty: 28 Suppository, Refills:  0   Start date: 06/14/2020, End date:  06/28/2020                      2.        Follow-up Information               Follow up With  Specialties  Details  Why  Contact Info              PCP  Schedule an appointment as soon as possible for a visit   As needed               3.   Return to ED if  worse            Diagnosis        Clinical Impression:       1.  COVID-19      2.  Dehydration      3.  Acute kidney injury ([place])         4.  Non-intractable vomiting with nausea, unspecified vomiting type            Attestations:      Windy Kalata, MD      Please note that this dictation was completed with Dragon, the computer voice recognition software.  Quite often unanticipated grammatical, syntax, homophones, and other interpretive errors are  inadvertently transcribed by the computer software.  Please disregard these errors.  Please excuse any errors that have escaped final proofreading.  Thank you.

## 2020-06-14 NOTE — ED Notes (Signed)
Pt able to tolerate PO intake. Pt stated she feels better.

## 2020-06-14 NOTE — ED Notes (Signed)
Patient vomiting.

## 2020-06-14 NOTE — ED Notes (Signed)
 Patient presents to the ED by EMS with c/o being COVID positive x 7 days. Reports vomiting x 2 days. Stated she ran out of her gabapentin  3 days ago. Stated she is not able to take methadone x 4 days due to the vomiting. Reports mid abdominal cramping due to vomiting. Reports a cough that makes her vomit black stuff. Reports taking ibuprofen x 4 days ago. Denies fevers. Denies diarrhea. Reports occasional SOB.     Pt is alert, oriented and appropriate. Ambulatory on arrival. Pt has to be asked several times before answering questions.       Emergency Department Nursing Plan of Care       The Nursing Plan of Care is developed from the Nursing assessment and Emergency Department Attending provider initial evaluation.  The plan of care may be reviewed in the "ED Provider note".    The Plan of Care was developed with the following considerations:   Patient / Family readiness to learn indicated ab:czmajopszi understanding  Persons(s) to be included in education: patient  Barriers to Learning/Limitations:No    Signed     Larraine JINNY Bathe    06/14/2020   12:12 AM

## 2020-06-15 NOTE — Progress Notes (Signed)
06/16/20 Attempted patient outreach for COVID follow up call per protocol. Unable to contact patient. Resolving TOC episode. APM

## 2020-06-17 ENCOUNTER — Inpatient Hospital Stay
Admit: 2020-06-17 | Discharge: 2020-06-18 | Disposition: A | Discharge status: Home / Self Care | Payer: MEDICAID | Attending: Emergency Medicine

## 2020-06-17 DIAGNOSIS — U071 COVID-19: Secondary | ICD-10-CM

## 2020-06-17 MED ORDER — LACTATED RINGERS BOLUS IV
Freq: Once | INTRAVENOUS | Status: AC
Start: 2020-06-17 — End: 2020-06-17
  Administered 2020-06-18: via INTRAVENOUS

## 2020-06-17 MED ORDER — ONDANSETRON (PF) 4 MG/2 ML INJECTION
4 mg/2 mL | INTRAMUSCULAR | Status: AC
Start: 2020-06-17 — End: 2020-06-17
  Administered 2020-06-18: via INTRAVENOUS

## 2020-06-17 MED FILL — LACTATED RINGERS IV: INTRAVENOUS | Qty: 1000

## 2020-06-17 NOTE — ED Provider Notes (Signed)
ED Provider Notes by Raelyn Mora, MD at 06/17/20 1951                Author: Raelyn Mora, MD  Service: EMERGENCY  Author Type: Physician       Filed: 06/17/20 2256  Date of Service: 06/17/20 1951  Status: Signed          Editor: Raelyn Mora, MD (Physician)               EMERGENCY DEPARTMENT HISTORY AND PHYSICAL EXAM        Please note that this dictation was completed with Dragon, the computer voice recognition software. Quite often unanticipated grammatical, syntax, homophones, and other interpretive  errors are inadvertently transcribed by the computer software. Please disregard these errors.  Please excuse any errors that have escaped final proofreading.      Date: 06/17/2020   Patient Name: Anita Bell   Patient Age and Sex: 49 y.o.  female        History of Presenting Illness          Chief Complaint       Patient presents with        ?  Vomiting           History Provided By: Patient      HPI: Anita Bell, is a  49 y.o. female with past medical history of opiate use disorder, on methadone, diagnosed  with Covid  On 8/17, presents to the ED with intractable nausea and vomiting, has not been able to tolerate p.o., including her medications. Symptoms started shortly after she was diagnosed with covid .She was seen in the emergency department on 8/24  with the same symptoms.  At that time she was noted to have an AKI, presented initially with borderline low blood pressure, was treated with IV fluids and antiemetics which normalized her vital signs and improved her symptoms.  She was discharged home  with a prescription for Phenergan suppositories. Reports that her symptoms returned shortly after she went home despite taking medications as prescribed.      No abdominal pain, sob, cough, fever. No headache.   Patient is not vaccinated against covid.      Pt denies any other alleviating or exacerbating factors. No other associated signs or symptoms. There are no other complaints, changes or  physical findings at this time.       PCP: None        Past History     All documented elements of the [facility] reviewed and verified by me. -Charolotte Capuchin, MD      Past Medical History:     Past Medical History:        Diagnosis  Date         ?  ADHD       ?  Asthma       ?  Depression       ?  Fibromyalgia       ?  Gastroenteritis       ?  Hydradenitis            suppurativa         ?  Osteoarthritis       ?  Other ill-defined conditions(799.89)            chronic back pain         ?  Psychiatric disorder             Past Surgical History:  Past Surgical History:         Procedure  Laterality  Date          ?  HX GYN              tubes tided           Family History:     Family History         Problem  Relation  Age of Onset          ?  Alcohol abuse  Neg Hx       ?  Arthritis-rheumatoid  Neg Hx       ?  Asthma  Neg Hx       ?  Bleeding Prob  Neg Hx       ?  Cancer  Neg Hx       ?  Diabetes  Neg Hx       ?  Elevated Lipids  Neg Hx       ?  Headache  Neg Hx       ?  Heart Disease  Neg Hx       ?  Hypertension  Neg Hx       ?  Lung Disease  Neg Hx       ?  Migraines  Neg Hx       ?  Psychiatric Disorder  Neg Hx       ?  Stroke  Neg Hx            ?  Mental Retardation  Neg Hx             Social History:     Social History          Tobacco Use         ?  Smoking status:  Never Smoker     ?  Smokeless tobacco:  Never Used       Substance Use Topics         ?  Alcohol use:  No     ?  Drug use:  Not Currently              Types:  Prescription, Opiates, Cocaine, Heroin           Allergies:     Allergies        Allergen  Reactions         ?  Aspirin  Nausea and Vomiting         ?  Aspirin  Hives             Review of Systems     All other systems reviewed and negative      Review of Systems    Constitutional: Positive for appetite change and fatigue . Negative for fever.    HENT: Negative.     Eyes: Negative.  Negative for photophobia and visual disturbance.    Respiratory: Negative for cough and shortness of breath.      Cardiovascular: Negative for chest pain and palpitations.    Gastrointestinal: Positive for nausea and vomiting . Negative for abdominal pain and diarrhea.    Endocrine: Negative.     Genitourinary: Negative for dysuria and flank pain.    Musculoskeletal: Negative for back pain and myalgias.    Skin: Negative.     Neurological: Negative for syncope and headaches.    Hematological: Negative.     All other systems reviewed and are negative.  Physical Exam     Reviewed patients vital signs and nursing note      Physical Exam   Vitals and nursing note reviewed.   HENT :       Head: Atraumatic.      Mouth/Throat:      Mouth: Mucous membranes are moist.    Eyes:       General: No scleral icterus.     Extraocular Movements: Extraocular movements intact.      Conjunctiva/sclera: Conjunctivae normal.      Pupils: Pupils are equal, round, and reactive to light.    Cardiovascular:       Rate and Rhythm: Normal rate and regular rhythm.      Pulses: Normal pulses.      Heart sounds: Normal heart sounds.    Pulmonary:       Effort: Pulmonary effort is normal.      Breath sounds: Normal breath sounds.   Abdominal :      Palpations: Abdomen is soft.      Tenderness: There is no abdominal tenderness.     Musculoskeletal:          General: Normal range of motion.      Cervical back: Normal range of motion and neck supple.    Skin:      General: Skin is warm and dry.      Capillary Refill: Capillary refill takes less than 2 seconds.    Neurological:       General: No focal deficit present.      Mental Status: She is alert and oriented to person, place, and time.    Psychiatric:         Mood and Affect: Mood normal.         Behavior: Behavior normal.               Diagnostic Study Results        Labs - I have personally  reviewed and interpreted all laboratory results. Charolotte Capuchin, MD,  MSc     Recent Results (from the past 24 hour(s))     CBC WITH AUTOMATED DIFF          Collection Time: 06/17/20  8:37 PM         Result   Value  Ref Range            WBC  6.9  3.6 - 11.0 K/uL       RBC  4.57  3.80 - 5.20 M/uL       HGB  12.6  11.5 - 16.0 g/dL       HCT  39.2  35.0 - 47.0 %       MCV  85.8  80.0 - 99.0 FL       MCH  27.6  26.0 - 34.0 PG       MCHC  32.1  30.0 - 36.5 g/dL       RDW  14.6 (H)  11.5 - 14.5 %       PLATELET  299  150 - 400 K/uL       MPV  11.7  8.9 - 12.9 FL       NRBC  0.0  0 PER 100 WBC       ABSOLUTE NRBC  0.00  0.00 - 0.01 K/uL       NEUTROPHILS  65  32 - 75 %       BAND NEUTROPHILS  3  0 - 6 %  LYMPHOCYTES  24  12 - 49 %       MONOCYTES  8  5 - 13 %       EOSINOPHILS  0  0 - 7 %       BASOPHILS  0  0 - 1 %       IMMATURE GRANULOCYTES  0  %       ABS. NEUTROPHILS  4.6  1.8 - 8.0 K/UL       ABS. LYMPHOCYTES  1.7  0.8 - 3.5 K/UL       ABS. MONOCYTES  0.6  0.0 - 1.0 K/UL       ABS. EOSINOPHILS  0.0  0.0 - 0.4 K/UL       ABS. BASOPHILS  0.0  0.0 - 0.1 K/UL       ABS. IMM. GRANS.  0.0  K/UL       DF  MANUAL          RBC COMMENTS  NORMOCYTIC, NORMOCHROMIC          WBC COMMENTS  REACTIVE LYMPHS          METABOLIC PANEL, COMPREHENSIVE          Collection Time: 06/17/20  8:37 PM         Result  Value  Ref Range            Sodium  148 (H)  136 - 145 mmol/L       Potassium  3.8  3.5 - 5.1 mmol/L       Chloride  110 (H)  97 - 108 mmol/L       CO2  26  21 - 32 mmol/L       Anion gap  12  5 - 15 mmol/L       Glucose  100  65 - 100 mg/dL       BUN  20  6 - 20 MG/DL       Creatinine  1.30 (H)  0.55 - 1.02 MG/DL       BUN/Creatinine ratio  15  12 - 20         GFR est AA  53 (L)  >60 ml/min/1.79m       GFR est non-AA  44 (L)  >60 ml/min/1.734m      Calcium  9.3  8.5 - 10.1 MG/DL       Bilirubin, total  0.5  0.2 - 1.0 MG/DL       ALT (SGPT)  38  12 - 78 U/L       AST (SGOT)  43 (H)  15 - 37 U/L       Alk. phosphatase  43 (L)  45 - 117 U/L       Protein, total  7.9  6.4 - 8.2 g/dL       Albumin  2.8 (L)  3.5 - 5.0 g/dL       Globulin  5.1 (H)  2.0 - 4.0 g/dL            A-G Ratio  0.5 (L)  1.1 - 2.2             Radiologic Studies -  I have personally reviewed and interpreted  all imaging studies and agree with radiology interpretation and report. - Charolotte CapuchinMD, MSc     No orders to display                Medical Decision Making  I am the first provider for this patient.      Records Reviewed:    I reviewed our electronic medical record system for any past medical records that were available that may contribute to the patient's current condition, including their PMH, surgical history, social and family history. Reviewed the nursing notes and vital  signs from today's visit. Nursing notes will be reviewed as they become available in realtime while the pt has been in the ED.    Charolotte Capuchin, MD, MSc      In addition, I read most recent ED visits, discharge summaries, if available and reviewed and interpreted prior ECGs.    Charolotte Capuchin, MD, MSc      I personally reviewed/interpreted pt's imaging.  Agree with official read by radiology as noted above.   Charolotte Capuchin, MD MSc      Vital Signs-Reviewed the patient's vital signs.   Patient Vitals for the past 24 hrs:            Temp  Pulse  Resp  BP  SpO2            06/17/20 2004  98.2 ??F (36.8 ??C)  74  16  114/70  97 %            06/17/20 1913  98.2 ??F (36.8 ??C)  95  18  106/87  96 %              Provider Notes (Medical Decision Making):    Ddx: dehydration, AKI, electrolyte abnormality, covid 19 infection, opiate withdrawal      Initial plan: We will provide IV fluids, IV antiemetics, recheck labs given her recent elevation in creatinine.  Patient is currently hemodynamically stable, all vital signs are normal and exam overall benign.  Will reassess.            ED Course:    Initial assessment performed. The patients presenting problems have been discussed, and they are in agreement with the care plan formulated and outlined with them.  I have encouraged them to ask questions as they arise throughout their visit.        ED Course as of Jun 18 2255       Fri Jun 17, 2020        2255  On my  interpretation of the patient's laboratory studies, no acute abnormalities noted. Creatinine improved compared to few days ago.   Tolerating PO now and feeling a lot better.   VSS, exam remains benign.   Has not had methadone in a few days, which is by choice. Opiate withdrawal likely contributing to overall symptoms. Will follow up with her clinic in am and start taper.     [TZ]              ED Course User Index   [TZ] Lynnett Langlinais, Lucianne Muss, MD              Progress note:   Patient has been reassessed and reports feeling considerably better, has normal vital signs and feels comfortable going home. I think this is reasonable as no findings today suggest a life-threatening  condition.       DISPOSITION: DISCHARGE   The patient's results have been reviewed with patient and available family and/or caregiver. They verbally convey their understanding and agreement of the patient's signs, symptoms,  diagnosis, treatment and prognosis and additionally agree to follow up as recommended in the discharge instructions or to return to the Emergency Department  should the patient's condition change prior to their follow-up appointment.    The patient and available family and/or caregiver verbally agree with the care plan and all of their questions have been answered. The discharge instructions have also been provided to the them with educational information regarding the patient's diagnosis  as well a list of reasons why the patient would want to return to the ER prior to their follow-up appointment should any concerns arise, the patient's condition change or symptoms worsen.      Charolotte Capuchin, MD, Msc      PLAN:     Current Discharge Medication List              START taking these medications          Details        ondansetron (Zofran ODT) 4 mg disintegrating tablet  1 Tablet by SubLINGual route every eight (8) hours as needed for Nausea or Vomiting.   Qty: 20 Tablet, Refills:  0   Start date: 06/17/2020                      2.         Follow-up Information               Follow up With  Specialties  Details  Why  [place]              [facility] EMERGENCY DEPT  Emergency Medicine    As needed, If symptoms worsen  [place]   [phone]             3.   Return to ED if worse          I, Raelyn Mora, MD, am the attending of record for this patient encounter.        Diagnosis        Clinical Impression:       1.  Intractable vomiting with nausea, unspecified vomiting type      2.  Dehydration      3.  COVID-19         4.  Opioid withdrawal ([place])            Attestation:   I personally performed the services described in this documentation on this date 06/17/2020 for patient [facility].  Charolotte Capuchin, MD

## 2020-06-17 NOTE — ED Notes (Signed)
 Emergency Department Nursing Plan of Care       The Nursing Plan of Care is developed from the Nursing assessment and Emergency Department Attending provider initial evaluation.  The plan of care may be reviewed in the "ED Provider note".    The Plan of Care was developed with the following considerations:   Patient / Family readiness to learn indicated ab:czmajopszi understanding  Persons(s) to be included in education: patient  Barriers to Learning/Limitations:No    Signed     Rosaline KANDICE Sauer, RN    06/17/2020   10:19 PM

## 2020-06-17 NOTE — ED Notes (Signed)
Diagnosed with covid on 8/17 at [facility]  Seen again on 8/24 for vomiting.  Returns today for continued vomiting.

## 2020-06-17 NOTE — ED Notes (Signed)
Patient is requesting to have Zofran sent to another 24 hour pharmacy, also she is requesting to have a refill of Gabapentin 800 mg po TID. This relayed to Dr. Jeannette How.

## 2020-06-18 LAB — CBC WITH AUTO DIFFERENTIAL
Band Neutrophils: 3 % (ref 0–6)
Basophils %: 0 % (ref 0–1)
Basophils Absolute: 0 10*3/uL (ref 0.0–0.1)
Eosinophils %: 0 % (ref 0–7)
Eosinophils Absolute: 0 10*3/uL (ref 0.0–0.4)
Granulocyte Absolute Count: 0 10*3/uL
Hematocrit: 39.2 % (ref 35.0–47.0)
Hemoglobin: 12.6 g/dL (ref 11.5–16.0)
Immature Granulocytes: 0 %
Lymphocytes %: 24 % (ref 12–49)
Lymphocytes Absolute: 1.7 10*3/uL (ref 0.8–3.5)
MCH: 27.6 PG (ref 26.0–34.0)
MCHC: 32.1 g/dL (ref 30.0–36.5)
MCV: 85.8 FL (ref 80.0–99.0)
MPV: 11.7 FL (ref 8.9–12.9)
Monocytes %: 8 % (ref 5–13)
Monocytes Absolute: 0.6 10*3/uL (ref 0.0–1.0)
NRBC Absolute: 0 10*3/uL (ref 0.00–0.01)
Neutrophils %: 65 % (ref 32–75)
Neutrophils Absolute: 4.6 10*3/uL (ref 1.8–8.0)
Nucleated RBCs: 0 PER 100 WBC
Platelets: 299 10*3/uL (ref 150–400)
RBC: 4.57 M/uL (ref 3.80–5.20)
RDW: 14.6 % — ABNORMAL HIGH (ref 11.5–14.5)
WBC Comment: REACTIVE
WBC: 6.9 10*3/uL (ref 3.6–11.0)

## 2020-06-18 LAB — COMPREHENSIVE METABOLIC PANEL
ALT: 38 U/L (ref 12–78)
AST: 43 U/L — ABNORMAL HIGH (ref 15–37)
Albumin/Globulin Ratio: 0.5 — ABNORMAL LOW (ref 1.1–2.2)
Albumin: 2.8 g/dL — ABNORMAL LOW (ref 3.5–5.0)
Alkaline Phosphatase: 43 U/L — ABNORMAL LOW (ref 45–117)
Anion Gap: 12 mmol/L (ref 5–15)
BUN: 20 MG/DL (ref 6–20)
Bun/Cre Ratio: 15 (ref 12–20)
CO2: 26 mmol/L (ref 21–32)
Calcium: 9.3 MG/DL (ref 8.5–10.1)
Chloride: 110 mmol/L — ABNORMAL HIGH (ref 97–108)
Creatinine: 1.3 MG/DL — ABNORMAL HIGH (ref 0.55–1.02)
EGFR IF NonAfrican American: 44 mL/min/{1.73_m2} — ABNORMAL LOW (ref >60)
GFR African American: 53 mL/min/{1.73_m2} — ABNORMAL LOW (ref >60)
Globulin: 5.1 g/dL — ABNORMAL HIGH (ref 2.0–4.0)
Glucose: 100 mg/dL (ref 65–100)
Potassium: 3.8 mmol/L (ref 3.5–5.1)
Sodium: 148 mmol/L — ABNORMAL HIGH (ref 136–145)
Total Bilirubin: 0.5 MG/DL (ref 0.2–1.0)
Total Protein: 7.9 g/dL (ref 6.4–8.2)

## 2020-06-18 LAB — METABOLIC PANEL, COMPREHENSIVE
A-G Ratio: 0.5 — ABNORMAL LOW (ref 1.1–2.2)
ALT (SGPT): 38 U/L (ref 12–78)
AST (SGOT): 43 U/L — ABNORMAL HIGH (ref 15–37)
Albumin: 2.8 g/dL — ABNORMAL LOW (ref 3.5–5.0)
Alk. phosphatase: 43 U/L — ABNORMAL LOW (ref 45–117)
Anion gap: 12 mmol/L (ref 5–15)
BUN/Creatinine ratio: 15 (ref 12–20)
BUN: 20 MG/DL (ref 6–20)
Bilirubin, total: 0.5 MG/DL (ref 0.2–1.0)
CO2: 26 mmol/L (ref 21–32)
Calcium: 9.3 MG/DL (ref 8.5–10.1)
Chloride: 110 mmol/L — ABNORMAL HIGH (ref 97–108)
Creatinine: 1.3 MG/DL — ABNORMAL HIGH (ref 0.55–1.02)
GFR est AA: 53 mL/min/{1.73_m2} — ABNORMAL LOW (ref >60)
GFR est non-AA: 44 mL/min/{1.73_m2} — ABNORMAL LOW (ref >60)
Globulin: 5.1 g/dL — ABNORMAL HIGH (ref 2.0–4.0)
Glucose: 100 mg/dL (ref 65–100)
Potassium: 3.8 mmol/L (ref 3.5–5.1)
Protein, total: 7.9 g/dL (ref 6.4–8.2)
Sodium: 148 mmol/L — ABNORMAL HIGH (ref 136–145)

## 2020-06-18 LAB — CBC WITH AUTOMATED DIFF
ABS. BASOPHILS: 0 10*3/uL (ref 0.0–0.1)
ABS. EOSINOPHILS: 0 10*3/uL (ref 0.0–0.4)
ABS. IMM. GRANS.: 0 10*3/uL
ABS. LYMPHOCYTES: 1.7 10*3/uL (ref 0.8–3.5)
ABS. MONOCYTES: 0.6 10*3/uL (ref 0.0–1.0)
ABS. NEUTROPHILS: 4.6 10*3/uL (ref 1.8–8.0)
ABSOLUTE NRBC: 0 10*3/uL (ref 0.00–0.01)
BAND NEUTROPHILS: 3 % (ref 0–6)
BASOPHILS: 0 % (ref 0–1)
EOSINOPHILS: 0 % (ref 0–7)
HCT: 39.2 % (ref 35.0–47.0)
HGB: 12.6 g/dL (ref 11.5–16.0)
IMMATURE GRANULOCYTES: 0 %
LYMPHOCYTES: 24 % (ref 12–49)
MCH: 27.6 PG (ref 26.0–34.0)
MCHC: 32.1 g/dL (ref 30.0–36.5)
MCV: 85.8 FL (ref 80.0–99.0)
MONOCYTES: 8 % (ref 5–13)
MPV: 11.7 FL (ref 8.9–12.9)
NEUTROPHILS: 65 % (ref 32–75)
NRBC: 0 PER 100 WBC
PLATELET: 299 10*3/uL (ref 150–400)
RBC: 4.57 M/uL (ref 3.80–5.20)
RDW: 14.6 % — ABNORMAL HIGH (ref 11.5–14.5)
WBC COMMENTS: REACTIVE
WBC: 6.9 10*3/uL (ref 3.6–11.0)

## 2020-06-18 MED ORDER — ONDANSETRON 4 MG TAB, RAPID DISSOLVE
4 mg | ORAL_TABLET | Freq: Three times a day (TID) | ORAL | 0 refills | Status: AC | PRN
Start: 2020-06-18 — End: ?

## 2020-06-18 MED ORDER — ONDANSETRON 4 MG TAB, RAPID DISSOLVE
4 mg | ORAL_TABLET | Freq: Three times a day (TID) | ORAL | 0 refills | Status: DC | PRN
Start: 2020-06-18 — End: 2020-06-17

## 2020-06-18 MED FILL — ONDANSETRON (PF) 4 MG/2 ML INJECTION: 4 mg/2 mL | INTRAMUSCULAR | Qty: 4

## 2020-06-19 NOTE — Progress Notes (Signed)
Unsuccessful attempts to contact patient. Episode resolved.

## 2022-05-28 ENCOUNTER — Inpatient Hospital Stay
Admit: 2022-05-28 | Discharge: 2022-05-28 | Disposition: A | Discharge status: Home / Self Care | Payer: MEDICAID | Attending: Emergency Medicine

## 2022-05-28 DIAGNOSIS — E860 Dehydration: Secondary | ICD-10-CM

## 2022-05-28 DIAGNOSIS — R112 Nausea with vomiting, unspecified: Secondary | ICD-10-CM

## 2022-05-28 LAB — CBC WITH AUTO DIFFERENTIAL
Absolute Immature Granulocyte: 0 10*3/uL
Basophils %: 0 % (ref 0–1)
Basophils Absolute: 0 10*3/uL (ref 0.0–0.1)
Eosinophils %: 4 % (ref 0–7)
Eosinophils Absolute: 0.2 10*3/uL (ref 0.0–0.4)
Hematocrit: 37.1 % (ref 35.0–47.0)
Hemoglobin: 12.2 g/dL (ref 11.5–16.0)
Immature Granulocytes: 0 %
Lymphocytes %: 38 % (ref 12–49)
Lymphocytes Absolute: 2.1 10*3/uL (ref 0.8–3.5)
MCH: 28.7 PG (ref 26.0–34.0)
MCHC: 32.9 g/dL (ref 30.0–36.5)
MCV: 87.3 FL (ref 80.0–99.0)
MPV: 12.2 FL (ref 8.9–12.9)
Monocytes %: 6 % (ref 5–13)
Monocytes Absolute: 0.3 10*3/uL (ref 0.0–1.0)
Neutrophils %: 52 % (ref 32–75)
Neutrophils Absolute: 3 10*3/uL (ref 1.8–8.0)
Nucleated RBCs: 0 PER 100 WBC
Platelets: 251 10*3/uL (ref 150–400)
RBC: 4.25 M/uL (ref 3.80–5.20)
RDW: 14.7 % — ABNORMAL HIGH (ref 11.5–14.5)
WBC: 5.6 10*3/uL (ref 3.6–11.0)
nRBC: 0 10*3/uL (ref 0.00–0.01)

## 2022-05-28 LAB — ETHANOL: Ethanol Lvl: <10 MG/DL (ref <10)

## 2022-05-28 LAB — COMPREHENSIVE METABOLIC PANEL W/ REFLEX TO MG FOR LOW K
ALT: 22 U/L (ref 12–78)
AST: 20 U/L (ref 15–37)
Albumin/Globulin Ratio: 0.9 — ABNORMAL LOW (ref 1.1–2.2)
Albumin: 3.7 g/dL (ref 3.5–5.0)
Alk Phosphatase: 80 U/L (ref 45–117)
Anion Gap: 9 mmol/L (ref 5–15)
BUN: 9 MG/DL (ref 6–20)
Bun/Cre Ratio: 9 — ABNORMAL LOW (ref 12–20)
CO2: 27 mmol/L (ref 21–32)
Calcium: 8.3 MG/DL — ABNORMAL LOW (ref 8.5–10.1)
Chloride: 103 mmol/L (ref 97–108)
Creatinine: 1 MG/DL (ref 0.55–1.02)
Est, Glom Filt Rate: >60 mL/min/{1.73_m2} (ref >60)
Globulin: 4 g/dL (ref 2.0–4.0)
Glucose: 100 mg/dL (ref 65–100)
Potassium: 3.9 mmol/L (ref 3.5–5.1)
Sodium: 139 mmol/L (ref 136–145)
Total Bilirubin: 0.4 MG/DL (ref 0.2–1.0)
Total Protein: 7.7 g/dL (ref 6.4–8.2)

## 2022-05-28 LAB — LIPASE: Lipase: 43 U/L — ABNORMAL LOW (ref 73–393)

## 2022-05-28 MED ORDER — PROMETHAZINE HCL 25 MG RE SUPP
25 MG | Freq: Four times a day (QID) | RECTAL | 0 refills | Status: AC | PRN
Start: 2022-05-28 — End: 2022-06-04

## 2022-05-28 MED ORDER — ONDANSETRON HCL 4 MG/2ML IJ SOLN
4 MG/2ML | INTRAMUSCULAR | Status: AC
Start: 2022-05-28 — End: 2022-05-28
  Administered 2022-05-28: 10:00:00 4 mg via INTRAVENOUS

## 2022-05-28 MED ORDER — SODIUM CHLORIDE 0.9 % IV BOLUS
0.9 % | Freq: Once | INTRAVENOUS | Status: AC
Start: 2022-05-28 — End: 2022-05-28
  Administered 2022-05-28: 11:00:00 1000 mL via INTRAVENOUS

## 2022-05-28 MED ORDER — DROPERIDOL 2.5 MG/ML IJ SOLN
2.5 MG/ML | Freq: Once | INTRAMUSCULAR | Status: AC | PRN
Start: 2022-05-28 — End: 2022-05-28
  Administered 2022-05-28: 10:00:00 1.25 mg via INTRAVENOUS

## 2022-05-28 MED ORDER — GABAPENTIN 100 MG PO CAPS
100 MG | Freq: Once | ORAL | Status: AC
Start: 2022-05-28 — End: 2022-05-28
  Administered 2022-05-28: 12:00:00 100 mg via ORAL

## 2022-05-28 MED ORDER — ONDANSETRON HCL 4 MG PO TABS
4 MG | ORAL_TABLET | Freq: Three times a day (TID) | ORAL | 0 refills | Status: AC | PRN
Start: 2022-05-28 — End: ?

## 2022-05-28 MED FILL — GABAPENTIN 100 MG PO CAPS: 100 MG | ORAL | Qty: 1

## 2022-05-28 MED FILL — ONDANSETRON HCL 4 MG/2ML IJ SOLN: 4 MG/2ML | INTRAMUSCULAR | Qty: 2

## 2022-05-28 MED FILL — DROPERIDOL 2.5 MG/ML IJ SOLN: 2.5 MG/ML | INTRAMUSCULAR | Qty: 2

## 2022-05-28 MED FILL — SODIUM CHLORIDE 0.9 % IV SOLN: 0.9 % | INTRAVENOUS | Qty: 1000

## 2022-05-28 NOTE — ED Notes (Signed)
Pt ambulates to bathroom at this time to provide urine sample. Per MD PO challenge and if successful pt can be discharged. Notes husband will be taking her home      Anita Bell, [place]  05/28/22 [phone]

## 2022-05-28 NOTE — ED Notes (Signed)
Patient (s)  given copy of dc instructions and 0 script(s). Patient (s)  verbalized understanding of instructions and script (s). Patient given a current medication reconciliation form and verbalized understanding of their medications. Patient (s) verbalized understanding of the importance of discussing medications with his or her physician or clinic they will be following up with. Patient alert and oriented and in no acute distress. Patient discharged home ambulatory with a steady gait unassisted.      Vernetta Honey, RN  05/28/22 [phone]

## 2022-05-28 NOTE — ED Provider Notes (Addendum)
EMERGENCY DEPARTMENT HISTORY AND PHYSICAL EXAM            Please note that this dictation was completed with the assistance of "Dragon", the computer voice recognition software. Quite often unanticipated grammatical, syntax, homophones, and other interpretive errors are inadvertently transcribed by the computer software. Please disregard these errors and any errors that have escaped final proofreading. Thank you.    Date of Evaluation: 05/28/22  Patient: Anita Bell  Patient Age and Sex: 51 y.o. female   MRN: [id number]  CSN: [id number]  PCP: None None    History of Present Illness     Chief Complaint   Patient presents with    Emesis     History Provided By: Patient/family/EMS (if available)    History is limited by: Nothing     HPI: Anita Bell, 51 y.o. female with past medical history as documented below presents to the ED with c/o of 2 hours of intractable nausea and vomiting.  Patient reports that she does take methadone daily at [facility] but reports she cannot go this morning due to vomiting.  Reports several episodes of nonbloody, nonbilious emesis.  Denies any recent drug use.  Reports mild generalized abdominal discomfort.  Denies any recent sick contacts.. Pt denies any other exacerbating or ameliorating factors. There are no other complaints, changes or physical findings pertinent to the HPI at this time.    Nursing Notes were all reviewed and agreed with or any disagreements were addressed in the HPI.    Past History   Past Medical History:  Past Medical History:   Diagnosis Date    Asthma     Depression     Fibromyalgia     Gastroenteritis     Hydradenitis     suppurativa    Other ill-defined conditions(799.89)     chronic back pain    Psychiatric disorder      Past Surgical History:  History reviewed. No pertinent surgical history.    Family History:   Family history reviewed and was non-contributory, unless specified below:  Family History   Problem Relation Age of Onset    Alcohol Abuse Neg  Hx     Rheum Arthritis Neg Hx     Asthma Neg Hx     Bleeding Prob Neg Hx     Cancer Neg Hx     Diabetes Neg Hx     Elevated Lipids Neg Hx     Headache Neg Hx     Heart Disease Neg Hx     Hypertension Neg Hx     Lung Disease Neg Hx     Migraines Neg Hx     Psychiatric Disorder Neg Hx     Stroke Neg Hx     Mental Retardation Neg Hx        Social History:  Social History     Tobacco Use    Smoking status: Never    Smokeless tobacco: Never   Substance Use Topics    Alcohol use: No    Drug use: Not Currently     Types: Prescription, Opiates , Cocaine, Heroin       Allergies:  Allergies   Allergen Reactions    Aspirin Hives and Nausea And Vomiting       Current Medications:  No current facility-administered medications on file prior to encounter.     Current Outpatient Medications on File Prior to Encounter   Medication Sig Dispense Refill    buprenorphine-naloxone (SUBOXONE) 8-2 MG FILM SL  film Place 1 Film under the tongue 3 times daily.      DULoxetine (CYMBALTA) 60 MG extended release capsule Take 1 capsule by mouth daily      famotidine (PEPCID) 20 MG tablet Take by mouth 2 times daily      gabapentin (NEURONTIN) 800 MG tablet Take by mouth 3 times daily.      ketorolac (TORADOL) 10 MG tablet Take by mouth every 6 hours as needed      methadone (DOLOPHINE) 10 MG/ML solution Take by mouth daily.      mirtazapine (REMERON SOL-TAB) 15 MG disintegrating tablet Take 1 tablet by mouth nightly      ondansetron (ZOFRAN-ODT) 4 MG disintegrating tablet Place under the tongue every 8 hours as needed      risperiDONE (RISPERDAL) 1 MG tablet Take 1 tablet by mouth daily      rizatriptan (MAXALT-MLT) 10 MG disintegrating tablet ceived the following from [facility]: Outside name: rizatriptan (MAXALT-MLT) 10 mg disintegrating tablet      topiramate (TOPAMAX) 50 MG tablet ceived the following from [facility]: Outside name: topiramate (TOPAMAX) 50 mg tablet         Review of Systems   A complete ROS  was reviewed by me today and all other systems negative, unless otherwise specified below:  Review of Systems   Constitutional:  Negative for chills and fever.   HENT:  Negative for rhinorrhea and sore throat.    Eyes:  Negative for pain and visual disturbance.   Respiratory:  Negative for cough and shortness of breath.    Cardiovascular:  Negative for chest pain.   Gastrointestinal:  Positive for nausea and vomiting. Negative for abdominal pain and diarrhea.   Musculoskeletal:  Negative for arthralgias and myalgias.   Skin:  Negative for rash.   Neurological:  Negative for speech difficulty, light-headedness and headaches.   Psychiatric/Behavioral:  Negative for agitation and confusion.    Physical Exam     Vitals:    05/28/22 0608 05/28/22 0737   BP: (!) 141/78 135/74   Pulse: 65 74   Resp: 18 19   Temp: 98.4 F (36.9 C) 98.2 F (36.8 C)   TempSrc: Oral Oral   SpO2: 100% 99%   Weight: 84.4 kg (186 lb)    Height: 1.6 m ('5\' 3"'$ )        Physical Exam  Vitals and nursing note reviewed.   Constitutional:       Appearance: Normal appearance.   HENT:      Head: Normocephalic and atraumatic.      Comments: Dry mucous membranes     Nose: Nose normal.      Mouth/Throat:      Mouth: Mucous membranes are moist.   Eyes:      Extraocular Movements: Extraocular movements intact.      Conjunctiva/sclera: Conjunctivae normal.   Cardiovascular:      Rate and Rhythm: Normal rate and regular rhythm.   Pulmonary:      Effort: Pulmonary effort is normal. No respiratory distress.   Abdominal:      General: Abdomen is flat. There is no distension.      Tenderness: There is no abdominal tenderness.   Musculoskeletal:         General: Normal range of motion.      Cervical back: Normal range of motion.   Skin:     General: Skin is warm.   Neurological:  General: No focal deficit present.      Mental Status: She is alert. Mental status is at baseline.   Psychiatric:         Mood and Affect: Mood normal.     Diagnostic Studies      LABORATORY RESULTS:  I have personally reviewed and interpreted all available laboratory results.   Recent Results (from the past 24 hour(s))   CBC with Auto Differential    Collection Time: 05/28/22  6:13 AM   Result Value Ref Range    WBC 5.6 3.6 - 11.0 K/uL    RBC 4.25 3.80 - 5.20 M/uL    Hemoglobin 12.2 11.5 - 16.0 g/dL    Hematocrit 37.1 35.0 - 47.0 %    MCV 87.3 80.0 - 99.0 FL    MCH 28.7 26.0 - 34.0 PG    MCHC 32.9 30.0 - 36.5 g/dL    RDW 14.7 (H) 11.5 - 14.5 %    Platelets 251 150 - 400 K/uL    MPV 12.2 8.9 - 12.9 FL    Nucleated RBCs 0.0 0 PER 100 WBC    nRBC 0.00 0.00 - 0.01 K/uL    Neutrophils % 52 32 - 75 %    Lymphocytes % 38 12 - 49 %    Monocytes % 6 5 - 13 %    Eosinophils % 4 0 - 7 %    Basophils % 0 0 - 1 %    Immature Granulocytes 0 %    Neutrophils Absolute 3.0 1.8 - 8.0 K/UL    Lymphocytes Absolute 2.1 0.8 - 3.5 K/UL    Monocytes Absolute 0.3 0.0 - 1.0 K/UL    Eosinophils Absolute 0.2 0.0 - 0.4 K/UL    Basophils Absolute 0.0 0.0 - 0.1 K/UL    Absolute Immature Granulocyte 0.0 K/UL    Differential Type SMEAR SCANNED      RBC Comment NORMOCYTIC, NORMOCHROMIC     Comprehensive Metabolic Panel w/ Reflex to MG    Collection Time: 05/28/22  6:13 AM   Result Value Ref Range    Sodium 139 136 - 145 mmol/L    Potassium 3.9 3.5 - 5.1 mmol/L    Chloride 103 97 - 108 mmol/L    CO2 27 21 - 32 mmol/L    Anion Gap 9 5 - 15 mmol/L    Glucose 100 65 - 100 mg/dL    BUN 9 6 - 20 MG/DL    Creatinine 1.00 0.55 - 1.02 MG/DL    Bun/Cre Ratio 9 (L) 12 - 20      Est, Glom Filt Rate >60 >60 ml/min/1.24m    Calcium 8.3 (L) 8.5 - 10.1 MG/DL    Total Bilirubin 0.4 0.2 - 1.0 MG/DL    ALT 22 12 - 78 U/L    AST 20 15 - 37 U/L    Alk Phosphatase 80 45 - 117 U/L    Total Protein 7.7 6.4 - 8.2 g/dL    Albumin 3.7 3.5 - 5.0 g/dL    Globulin 4.0 2.0 - 4.0 g/dL    Albumin/Globulin Ratio 0.9 (L) 1.1 - 2.2     Lipase    Collection Time: 05/28/22  6:13 AM   Result Value Ref Range    Lipase 43 (L) 73 - 393 U/L   Ethanol     Collection Time: 05/28/22  6:13 AM   Result Value Ref Range    Ethanol Lvl <10 <10 MG/DL  RADIOLOGY RESULTS:  I have personally reviewed and interpreted all available imaging studies and agree with radiology interpretation.  No orders to display       No orders to display     [facility] ED COURSE   I am the first and primary ED physician for this patient's ED visit today.    I reviewed our EMR for any past records that may contribute to the patient's current condition, including their past medical, surgical, social and family history. This also includes their most recent ED visits, previous hospitalizations and prior diagnostic data. I have reviewed and summarized the most pertinent findings in my HPI and MDM.    Vital Signs Reviewed:  Patient Vitals for the past 24 hrs:   Temp Pulse Resp BP SpO2   05/28/22 0737 98.2 F (36.8 C) 74 19 135/74 99 %   05/28/22 0608 98.4 F (36.9 C) 65 18 (!) 141/78 100 %     Pulse Oximetry Analysis: 100% on RA with good pleth    Cardiac Monitor:   Rate: 65 bpm  The cardiac monitor revealed the following rhythm as interpreted by me: Normal Sinus Rhythm  Cardiac and pulse ox monitoring were ordered to monitor patient for signs of cardiac dysrhythmia, which they are at risk for based on their history and/or risk for cardiovascular disease and/or metabolic abnormalities.     Records Reviewed: Nursing Notes, Old Medical Records, Previous electrocardiograms, Previous Radiology Studies and Previous Laboratory Studies, EMS reports    DIFFERENTIAL DIAGNOSIS AND PLAN:  Patient presents with acute nausea and vomiting. Pt is afebrile with stable vitals; abdominal exam is non-peritoneal. Pt is without headache, visual disturbances, numbness, focal weakness, or ataxia and has an otherwise non-focal neuro exam. Do not suspect intra-cranial pathology. Differential includes gastritis/GERD, electrolyte disturbance, AKI, pancreatitis, cholelithiasis, cholecystitis, hepatitis, renal  pathology, ACS, gastroenteritis, infection such as UTI/PNA. Will obtain labs and possibly imaging and EKG PRN. Will treat symptomatically and reassess.    Pertinent Chronic Medical Conditions Affecting Care:  Past Medical History:   Diagnosis Date    Asthma     Depression     Fibromyalgia     Gastroenteritis     Hydradenitis     suppurativa    Other ill-defined conditions(799.89)     chronic back pain    Psychiatric disorder        Review of Prior Records and External Documents:  See below    Independent History:   An independent clinically history was obtained. EMS states vital signs stable for them.  Blood sugar normal.    Social Determinants of Health Affecting Dx or Tx:  Patient cannot afford medications.   Patient has a substance abuse problem.   Patient lacks transportation.   Social Determinants of Health with Concerns     Alcohol Use: Not on file   Financial Resource Strain: Not on file   Food Insecurity: Not on file   Transportation Needs: Not on file   Physical Activity: Not on file   Stress: Not on file   Social Connections: Not on file   Intimate Partner Violence: Not on file   Depression: Not on file   Housing Stability: Not on file     Social History     Tobacco Use    Smoking status: Never    Smokeless tobacco: Never   Substance Use Topics    Alcohol use: No    Drug use: Not Currently     Types: Prescription,  Opiates , Cocaine, Heroin       ED Course: Progress Notes, Reevaluation, and Consults:  Initial assessment performed. I discussed presenting problems and concerns, and my formulated plan for today's visit with the patient and any available family members. I have encouraged them to ask questions as they arise throughout the visit.     ED Physician Orders:   Orders Placed This Encounter   Procedures    CBC with Auto Differential    Comprehensive Metabolic Panel w/ Reflex to MG    Lipase    Ethanol     ED Medications Administered:   Medications   ondansetron (ZOFRAN) injection 4 mg (4 mg IntraVENous  Given 05/28/22 0626)   sodium chloride 0.9 % bolus 1,000 mL (0 mLs IntraVENous Stopped 05/28/22 0732)   droperidol (INAPSINE) injection 1.25 mg (1.25 mg IntraVENous Given 05/28/22 0626)   gabapentin (NEURONTIN) capsule 100 mg (100 mg Oral Given 05/28/22 0732)     Pt received IV/IM medications per above and placed on appropriate cardiac/respiratory monitoring due to drug toxicity.    ED Physician Interpretation of Test Results:  All results were independently reviewed and interpreted by myself, notably showing:     RADIOLOGY:  Non-plain film images such as CT, ultrasound and MRI are read by the radiologist. Plain radiographic images are visualized and preliminarily interpreted by the ED Provider with the below findings:     Imaging independently interpreted by me:     Interpretation per the Radiologist below, if available at the time of this note:  No orders to display     My interpretation of laboratory results:   See below    Amount and/or Complexity of Data Reviewed  HIGH complexity decision making performed   Presentation: ACUTE and SEVERE  Clinical lab tests: ordered as appropriate & reviewed  Tests in the radiology section of CPT: ordered as appropriate & reviewed  Tests in the medicine section of CPT: ordered as appropriate & reviewed  Obtain history from someone other than the patient: yes  Review and summarize past medical records: yes  Independent visualization of images, tracings, or specimens: yes    Risks  OTC drugs.  Prescription drug management.  Parenteral controlled substances.  Drug therapy requiring intensive monitoring for toxicity.  Decision regarding hospitalization.     Progress Note:  I have just re-evaluated the patient. Pt reports improvement of her symptoms after ED treatment. I have reviewed her vital signs and determined there is currently no worsening in their condition or physical exam. Results have been reviewed with them and their questions have been answered. I will continue to review  further results as they come available.     ED Course as of 05/28/22 0947   Mon May 28, 2022   0607 Reviewed chart from [facility] August 2022, patient on Neurontin, Robaxin, Symbicort and lidocaine patch.  Patient is also on methadone. [HW]   0608 Per chart history noted history of ADHD, asthma, history of spinal cord injury, multiple sclerosis, obesity and chronic pain syndrome on methadone. [HW]   1610 Reviewed CT scan from Mar 19, 2021, no acute pathology, evidence of diverticulosis. [HW]   0609 I did review the ER notes from May 22, 2021 at [facility].  Patient with known history of opioid use disorder on methadone.  Patient was diagnosed with psychogenic vomiting with nausea. [HW]   [phone] Reviewed prior tox screens, patient has been positive for both methadone and THC in the past. [HW]  0645 Labs noted thus far, CBC without leukocytosis, hemoglobin stable.  CMP with normal electrolytes and renal function, normal LFTs.  Alcohol level negative.  Lipase 43.  Urine studies pending at this time.  Patient feeling better after fluid bolus and IV Zofran. [HW]   [id number] Patient also given IV droperidol, admits to recent marijuana use. [HW]      ED Course User Index  [HW] Fredderick Erb, MD      CONSULTS:   None  Shared Decision Making:   I considered CT abdomen pelvis, but did not after shared decision making with patient due to reassuring blood work as well as patient having a benign, nonfocal and nonsurgical abdominal exam..     Progress Note:  I have re-examined the patient. Pt states she feels much better and symptoms improved. Tolerating oral intake. Abdomen is soft and without guarding, rebound or other peritoneal signs. I have discussed with patient the importance of close f/u and to return to the ED if symptoms don't improve or worsen.    DISCHARGE  Pt reassessed and symptoms noted to have improved significantly after ED treatment. Judine Wimbish's labs and imaging have been reviewed with her and available  family. She verbally conveys understanding and agreement of the signs, symptoms, diagnosis, treatment and prognosis and additionally agrees to follow up as recommended with Dr. None None and/or specialist as instructed. She agrees with the care plan we have created and conveys that all of her questions have been answered. Additionally, I have put together a packet of discharge instructions for her that include: 1) Educational information regarding their diagnosis, 2) How to care for their diagnosis at home, as well a 3) List of reasons why they would want to return to the ED prior to their follow-up appointment should their condition change or symptoms worsen.     I have answered all questions to the patient's satisfaction. Strict return precautions given. She and/or family conveyed understanding and agreement with care plan. Vital signs stable for discharge.     PLAN  1. Return precautions as discussed with patient and available family/caregiver.     2. DISCHARGE MEDICATIONS:     Medication List        START taking these medications      ondansetron 4 MG tablet  Commonly known as: ZOFRAN  Take 1 tablet by mouth 3 times daily as needed for Nausea or Vomiting     promethazine 25 MG suppository  Commonly known as: Promethegan  Place 1 suppository rectally every 6 hours as needed for Nausea            ASK your doctor about these medications      buprenorphine-naloxone 8-2 MG Film SL film  Commonly known as: SUBOXONE     DULoxetine 60 MG extended release capsule  Commonly known as: CYMBALTA     famotidine 20 MG tablet  Commonly known as: PEPCID     gabapentin 800 MG tablet  Commonly known as: NEURONTIN     ketorolac 10 MG tablet  Commonly known as: TORADOL     methadone 10 MG/ML solution  Commonly known as: DOLOPHINE     mirtazapine 15 MG disintegrating tablet  Commonly known as: REMERON SOL-TAB     ondansetron 4 MG disintegrating tablet  Commonly known as: ZOFRAN-ODT     risperiDONE 1 MG tablet  Commonly known as:  RISPERDAL     rizatriptan 10 MG disintegrating tablet  Commonly known as: MAXALT-MLT  topiramate 50 MG tablet  Commonly known as: TOPAMAX               Where to Get Your Medications        These medications were sent to [facility], [place]  [place], [place] [address]      Phone: [phone]   ondansetron 4 MG tablet  promethazine 25 MG suppository           3. PATIENT REFERRED TO:  None None      As needed, If symptoms worsen    [facility] EMERGENCY DEPT  [place]  [phone]    As needed, If symptoms worsen    Micah Flesher, APRN - NP  [address]  [place] [address]  [phone]                                  CLINICAL IMPRESSION     1. Intractable nausea and vomiting    2. Methadone use    3. Acute dehydration      Attestation:  I am the attending of record for this patient. I personally performed the services described in this documentation on this date, 05/28/2022 for patient, [facility]. I have reviewed the chart and verified that the record is accurate and complete.      Fredderick Erb, MD (Electronic Signature)                Fredderick Erb, MD  05/28/22 [phone]

## 2022-05-28 NOTE — Discharge Instructions (Addendum)
Thank You!    It was a pleasure taking care of you in our Emergency Department today. We know that when you come to [facility], you are entrusting [place] with your health, comfort, and safety. Our physicians and nurses honor that trust, and truly appreciate the opportunity to care for you and your loved ones.      We also value your feedback. If you receive a survey about your Emergency Department experience today, please fill it out.  We care about our patients' feedback, and we listen to what you have to say. Thank you.    Dr. Fredderick Erb, M.D.      ____________________________________________________________________  I have included a copy of your lab results and/or radiologic studies from today's visit so you can have them easily available at your follow-up visit. We hope you feel better and please do not hesitate to contact the ED if you have any questions at all!    Recent Results (from the past 12 hour(s))   CBC with Auto Differential    Collection Time: 05/28/22  6:13 AM   Result Value Ref Range    WBC 5.6 3.6 - 11.0 K/uL    RBC 4.25 3.80 - 5.20 M/uL    Hemoglobin 12.2 11.5 - 16.0 g/dL    Hematocrit 37.1 35.0 - 47.0 %    MCV 87.3 80.0 - 99.0 FL    MCH 28.7 26.0 - 34.0 PG    MCHC 32.9 30.0 - 36.5 g/dL    RDW 14.7 (H) 11.5 - 14.5 %    Platelets 251 150 - 400 K/uL    MPV 12.2 8.9 - 12.9 FL    Nucleated RBCs 0.0 0 PER 100 WBC    nRBC 0.00 0.00 - 0.01 K/uL    Neutrophils % 52 32 - 75 %    Lymphocytes % 38 12 - 49 %    Monocytes % 6 5 - 13 %    Eosinophils % 4 0 - 7 %    Basophils % 0 0 - 1 %    Immature Granulocytes 0 %    Neutrophils Absolute 3.0 1.8 - 8.0 K/UL    Lymphocytes Absolute 2.1 0.8 - 3.5 K/UL    Monocytes Absolute 0.3 0.0 - 1.0 K/UL    Eosinophils Absolute 0.2 0.0 - 0.4 K/UL    Basophils Absolute 0.0 0.0 - 0.1 K/UL    Absolute Immature Granulocyte 0.0 K/UL    Differential Type SMEAR SCANNED      RBC Comment NORMOCYTIC, NORMOCHROMIC     Comprehensive Metabolic Panel w/ Reflex to  MG    Collection Time: 05/28/22  6:13 AM   Result Value Ref Range    Sodium 139 136 - 145 mmol/L    Potassium 3.9 3.5 - 5.1 mmol/L    Chloride 103 97 - 108 mmol/L    CO2 27 21 - 32 mmol/L    Anion Gap 9 5 - 15 mmol/L    Glucose 100 65 - 100 mg/dL    BUN 9 6 - 20 MG/DL    Creatinine 1.00 0.55 - 1.02 MG/DL    Bun/Cre Ratio 9 (L) 12 - 20      Est, Glom Filt Rate >60 >60 ml/min/1.90m    Calcium 8.3 (L) 8.5 - 10.1 MG/DL    Total Bilirubin 0.4 0.2 - 1.0 MG/DL    ALT 22 12 - 78 U/L    AST 20 15 - 37 U/L    Alk  Phosphatase 80 45 - 117 U/L    Total Protein 7.7 6.4 - 8.2 g/dL    Albumin 3.7 3.5 - 5.0 g/dL    Globulin 4.0 2.0 - 4.0 g/dL    Albumin/Globulin Ratio 0.9 (L) 1.1 - 2.2     Lipase    Collection Time: 05/28/22  6:13 AM   Result Value Ref Range    Lipase 43 (L) 73 - 393 U/L   Ethanol    Collection Time: 05/28/22  6:13 AM   Result Value Ref Range    Ethanol Lvl <10 <10 MG/DL       No orders to display     '@CT48'$ @  The exam and treatment you received in the Emergency Department were for an urgent problem and are not intended as complete care. It is important that you follow up with a doctor, nurse practitioner, or physician assistant for ongoing care. If your symptoms become worse or you do not improve as expected and you are unable to reach your usual health care provider, you should return to the Emergency Department. We are available 24 hours a day.    Please take your discharge instructions with you when you go to your follow-up appointment.     If a prescription has been provided, please have it filled as soon as possible to prevent a delay in treatment. Read the entire medication instruction sheet provided to you by the pharmacy. If you have any questions or reservations about taking the medication due to side effects or interactions with other medications, please call your primary care physician or contact the ER to speak with the charge nurse.     Please make an appointment with your family doctor or the  physician you were referred to for follow-up of this visit as instructed on your discharge paperwork. Return to the ER if you are unable to be seen or if you are unable to be seen in a timely manner.    If you have any problem arranging the follow-up visit, contact the Emergency Department immediately.

## 2022-05-28 NOTE — ED Notes (Signed)
Pt cannot urinate at this time. Pt drowsy     Anita Bell, [place]  05/28/22 [phone]

## 2022-05-28 NOTE — ED Triage Notes (Signed)
Vomiting x 2 hours. Pt notes she takes methadone daily at "[facility]" but reports she cannot go this AM due to vomiting and states, "I won't be able to keep it down". Denies dug or ETOH use.     Alert and oriented x4. Skin warm dry and intact. Ambulates independently.      Emergency Department Nursing Plan of Care       The Nursing Plan of Care is developed from the Nursing assessment and Emergency Department Attending provider initial evaluation.  The plan of care may be reviewed in the "ED Provider note".    The Plan of Care was developed with the following considerations:   Patient / Family readiness to learn indicated CH:[id number] understanding  Persons(s) to be included in education: patient  Barriers to Learning/Limitations: No    Signed     Sueanne Margarita, RN    05/28/2022   6:11 AM

## 2024-05-05 ENCOUNTER — Inpatient Hospital Stay: Admit: 2024-05-05 | Discharge: 2024-05-05 | Arrived: VH

## 2024-05-05 DIAGNOSIS — Z5329 Procedure and treatment not carried out because of patient's decision for other reasons: Principal | ICD-10-CM
